# Patient Record
Sex: Female | Born: 1940 | Race: White | Hispanic: No | Marital: Married | Smoking: Former smoker
Health system: Southern US, Community
[De-identification: ages and names within clinical notes are randomized; demographics above are authoritative.]

## PROBLEM LIST (undated history)

## (undated) DIAGNOSIS — A0811 Acute gastroenteropathy due to Norwalk agent: Secondary | ICD-10-CM

## (undated) DIAGNOSIS — M1990 Unspecified osteoarthritis, unspecified site: Secondary | ICD-10-CM

## (undated) DIAGNOSIS — M8580 Other specified disorders of bone density and structure, unspecified site: Secondary | ICD-10-CM

## (undated) DIAGNOSIS — N3021 Other chronic cystitis with hematuria: Secondary | ICD-10-CM

## (undated) DIAGNOSIS — F32A Depression, unspecified: Secondary | ICD-10-CM

## (undated) DIAGNOSIS — R1013 Epigastric pain: Secondary | ICD-10-CM

## (undated) DIAGNOSIS — Z8614 Personal history of Methicillin resistant Staphylococcus aureus infection: Secondary | ICD-10-CM

## (undated) DIAGNOSIS — G8929 Other chronic pain: Secondary | ICD-10-CM

## (undated) DIAGNOSIS — E785 Hyperlipidemia, unspecified: Secondary | ICD-10-CM

## (undated) DIAGNOSIS — R4189 Other symptoms and signs involving cognitive functions and awareness: Secondary | ICD-10-CM

## (undated) DIAGNOSIS — F329 Major depressive disorder, single episode, unspecified: Secondary | ICD-10-CM

## (undated) DIAGNOSIS — N289 Disorder of kidney and ureter, unspecified: Secondary | ICD-10-CM

## (undated) DIAGNOSIS — C9030 Solitary plasmacytoma not having achieved remission: Secondary | ICD-10-CM

## (undated) DIAGNOSIS — L8994 Pressure ulcer of unspecified site, stage 4: Secondary | ICD-10-CM

## (undated) DIAGNOSIS — N3020 Other chronic cystitis without hematuria: Secondary | ICD-10-CM

## (undated) DIAGNOSIS — R1310 Dysphagia, unspecified: Secondary | ICD-10-CM

## (undated) DIAGNOSIS — R32 Unspecified urinary incontinence: Secondary | ICD-10-CM

## (undated) DIAGNOSIS — F0390 Unspecified dementia without behavioral disturbance: Secondary | ICD-10-CM

---

## 2002-10-01 ENCOUNTER — Emergency Department (HOSPITAL_COMMUNITY): Admission: EM | Admit: 2002-10-01 | Discharge: 2002-10-01 | Payer: Self-pay | Admitting: Emergency Medicine

## 2002-10-01 ENCOUNTER — Encounter: Payer: Self-pay | Admitting: Emergency Medicine

## 2007-12-21 ENCOUNTER — Ambulatory Visit: Payer: Self-pay | Admitting: Internal Medicine

## 2008-01-18 ENCOUNTER — Ambulatory Visit: Payer: Self-pay | Admitting: Cardiology

## 2008-01-18 ENCOUNTER — Ambulatory Visit: Payer: Self-pay

## 2008-02-11 ENCOUNTER — Encounter: Admission: RE | Admit: 2008-02-11 | Discharge: 2008-02-11 | Payer: Self-pay | Admitting: Neurology

## 2008-08-30 ENCOUNTER — Ambulatory Visit: Payer: Self-pay | Admitting: Gastroenterology

## 2009-09-17 ENCOUNTER — Inpatient Hospital Stay (HOSPITAL_COMMUNITY): Admission: EM | Admit: 2009-09-17 | Discharge: 2009-10-05 | Payer: Self-pay | Admitting: Emergency Medicine

## 2009-09-17 ENCOUNTER — Ambulatory Visit: Payer: Self-pay | Admitting: Cardiovascular Disease

## 2009-09-18 ENCOUNTER — Encounter: Payer: Self-pay | Admitting: Cardiology

## 2009-09-18 ENCOUNTER — Encounter (INDEPENDENT_AMBULATORY_CARE_PROVIDER_SITE_OTHER): Payer: Self-pay | Admitting: General Surgery

## 2009-10-05 ENCOUNTER — Inpatient Hospital Stay: Admission: RE | Admit: 2009-10-05 | Discharge: 2009-11-05 | Payer: Self-pay

## 2009-10-05 ENCOUNTER — Ambulatory Visit: Payer: Self-pay | Admitting: Critical Care Medicine

## 2009-10-22 ENCOUNTER — Ambulatory Visit: Payer: Self-pay | Admitting: Pulmonary Disease

## 2009-11-28 ENCOUNTER — Inpatient Hospital Stay: Payer: Self-pay | Admitting: Internal Medicine

## 2009-12-15 ENCOUNTER — Inpatient Hospital Stay: Payer: Self-pay | Admitting: Internal Medicine

## 2010-07-15 ENCOUNTER — Ambulatory Visit: Payer: Self-pay | Admitting: Internal Medicine

## 2010-08-09 IMAGING — CR DG ABD PORTABLE 1V
1 series · 1 of 1 positions shown · non-contrast
Comparison: Abdomen 09/27/2009.

CLINICAL DATA: Panda placement.

ABDOMEN - 1 VIEW

[AP]
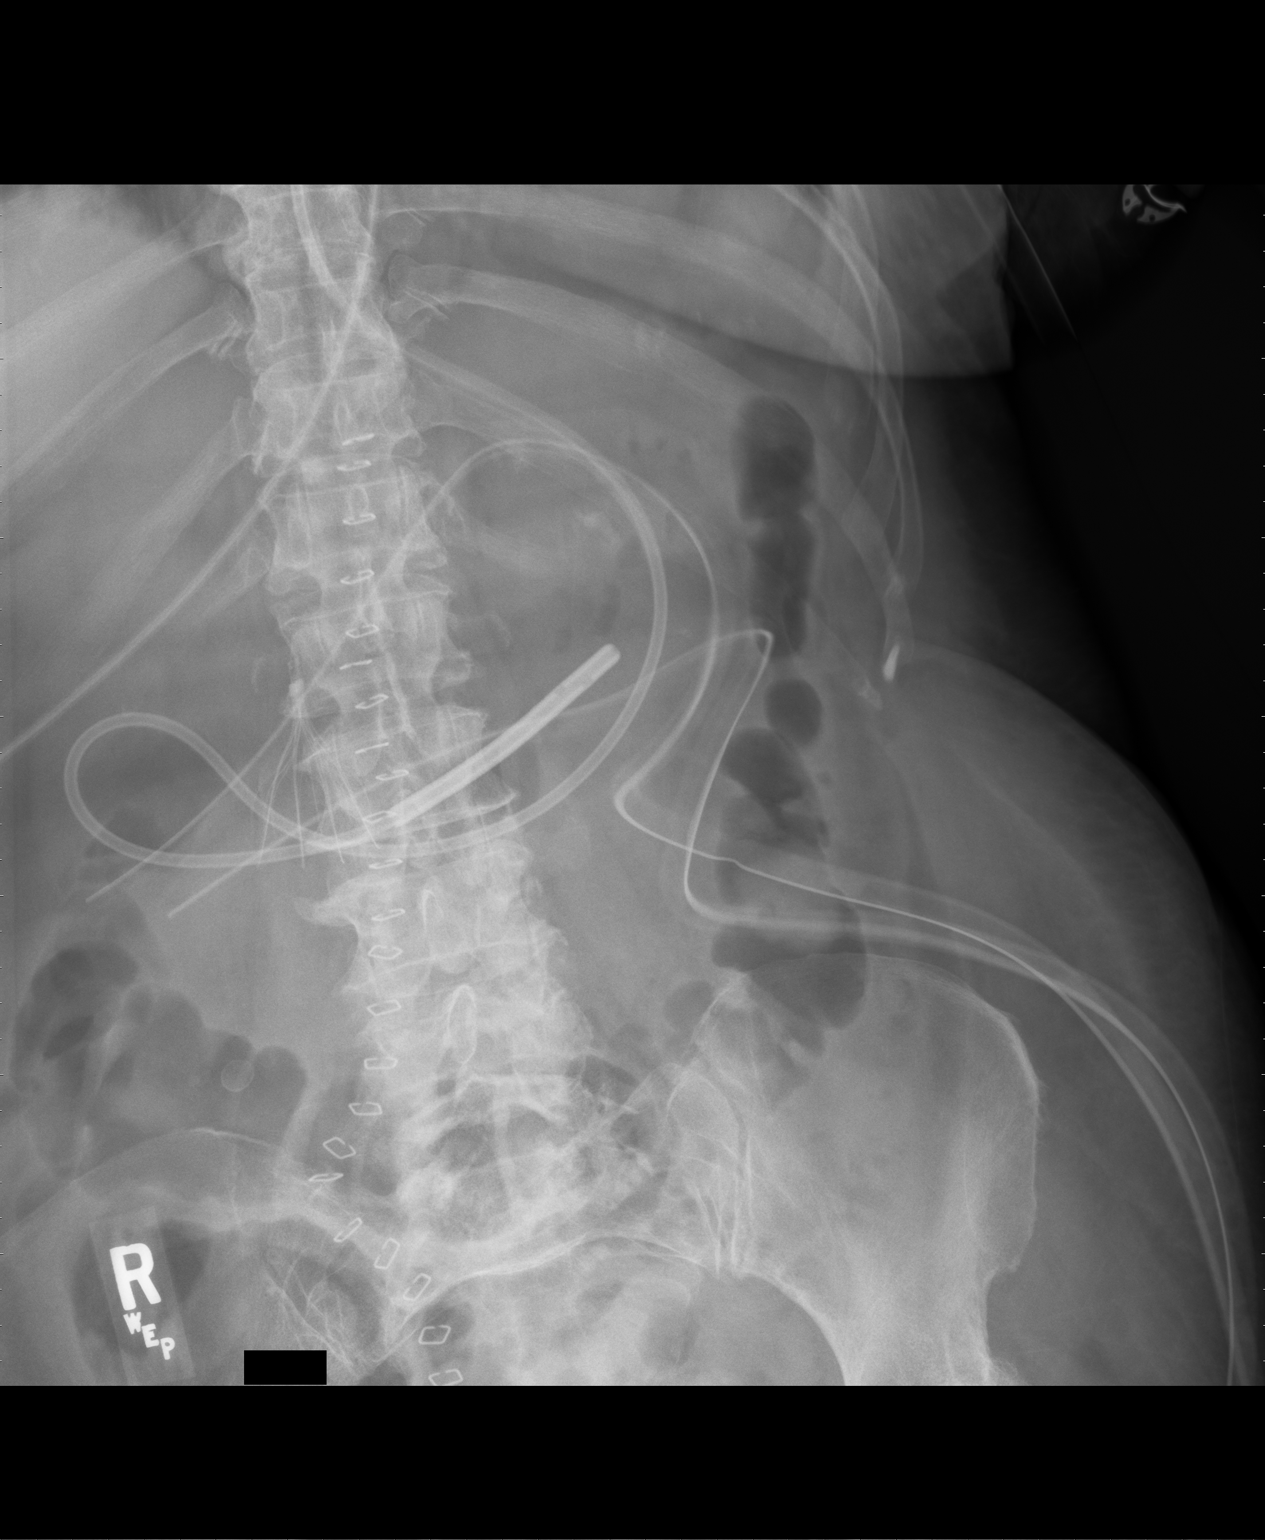

[1 of 1 positions shown; findings below may reference images not displayed]

FINDINGS: Panda tube remains looped in the distal stomach with the
tip projecting retrograde in the mid body of the stomach.
IMPRESSION: As above.

## 2010-08-13 IMAGING — CR DG CHEST 1V PORT
1 series · 1 of 1 positions shown · non-contrast
Comparison: Portable chest x-ray of 10/01/2009

CLINICAL DATA: Trauma, on ventilator, follow-up

PORTABLE CHEST - 1 VIEW

[AP]
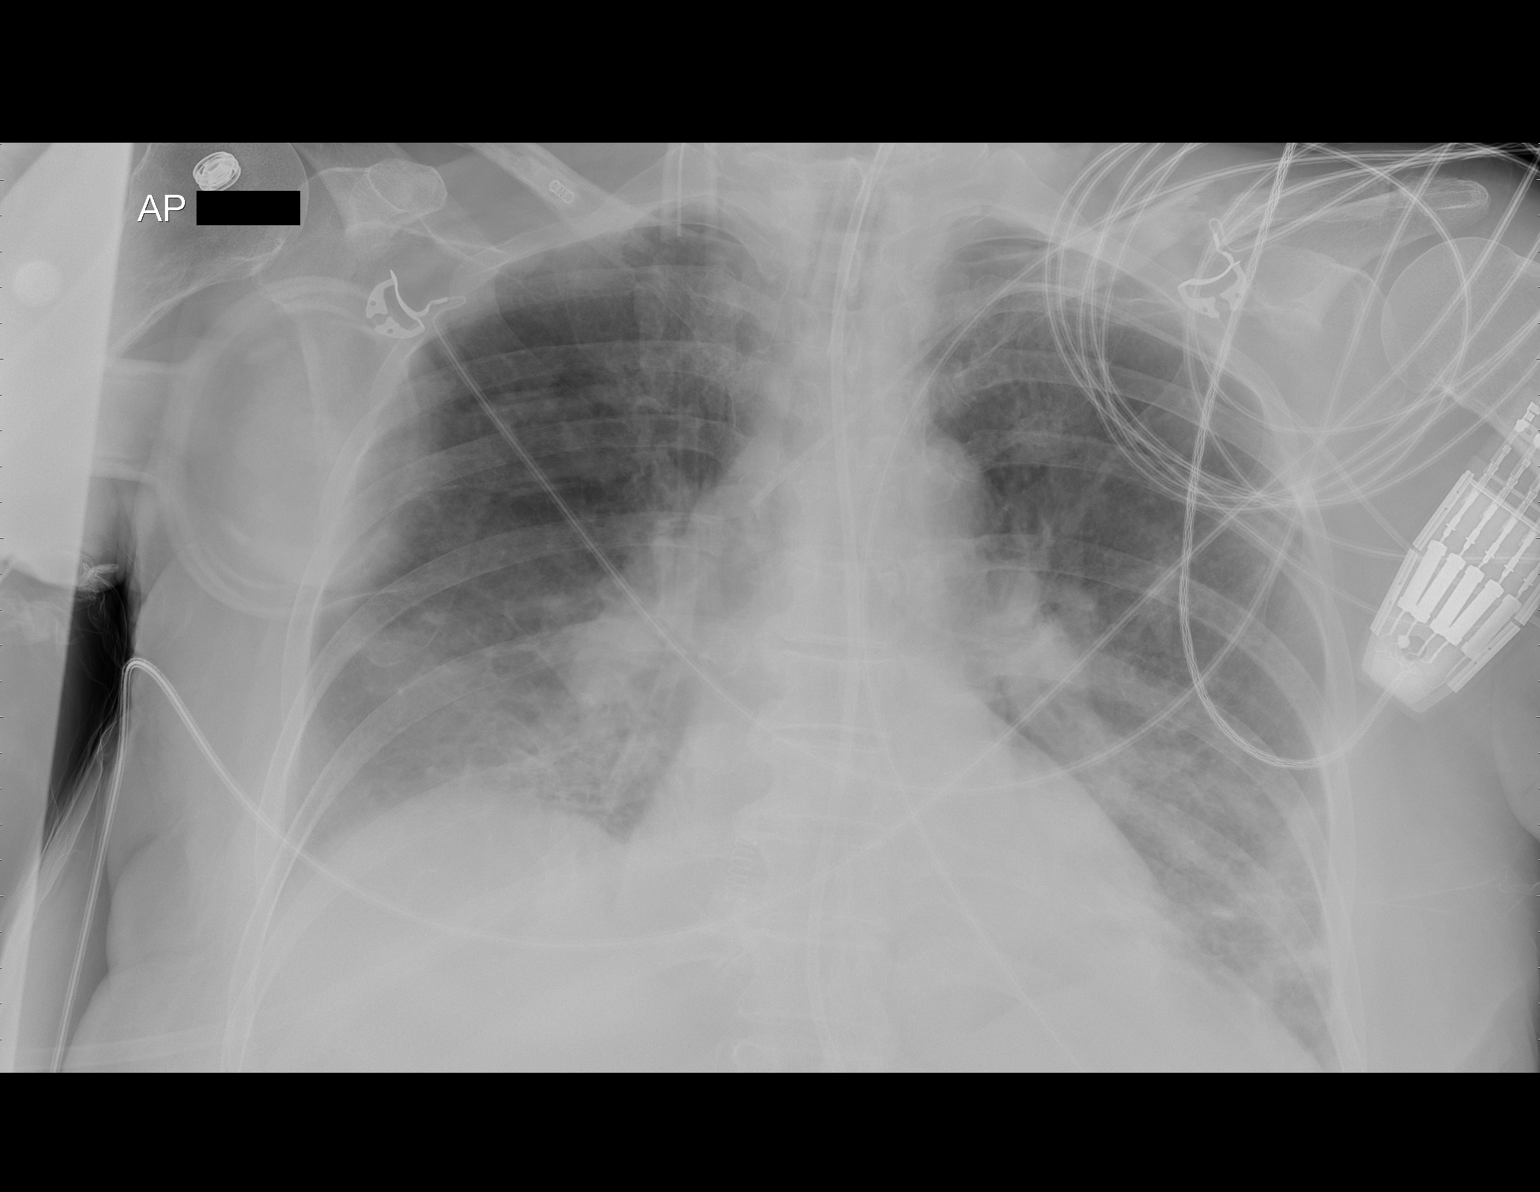

[1 of 1 positions shown; findings below may reference images not displayed]

FINDINGS: There is little change to perhaps slight improvement in
aeration of the lung bases.  The left chest tube has been removed
and no pneumothorax is seen.  Basilar opacities remain.  Heart size
is stable.  Endotracheal tube, left central venous line, and
feeding tube remain.
IMPRESSION: Left chest tube removed.  No pneumothorax.  Basilar opacities
remain.

## 2010-08-14 IMAGING — CR DG CHEST 1V PORT
1 series · 1 of 1 positions shown · non-contrast
Comparison: 10/02/2009

CLINICAL DATA: Pneumothorax, respiratory failure.

PORTABLE CHEST - 1 VIEW

[view not recorded]
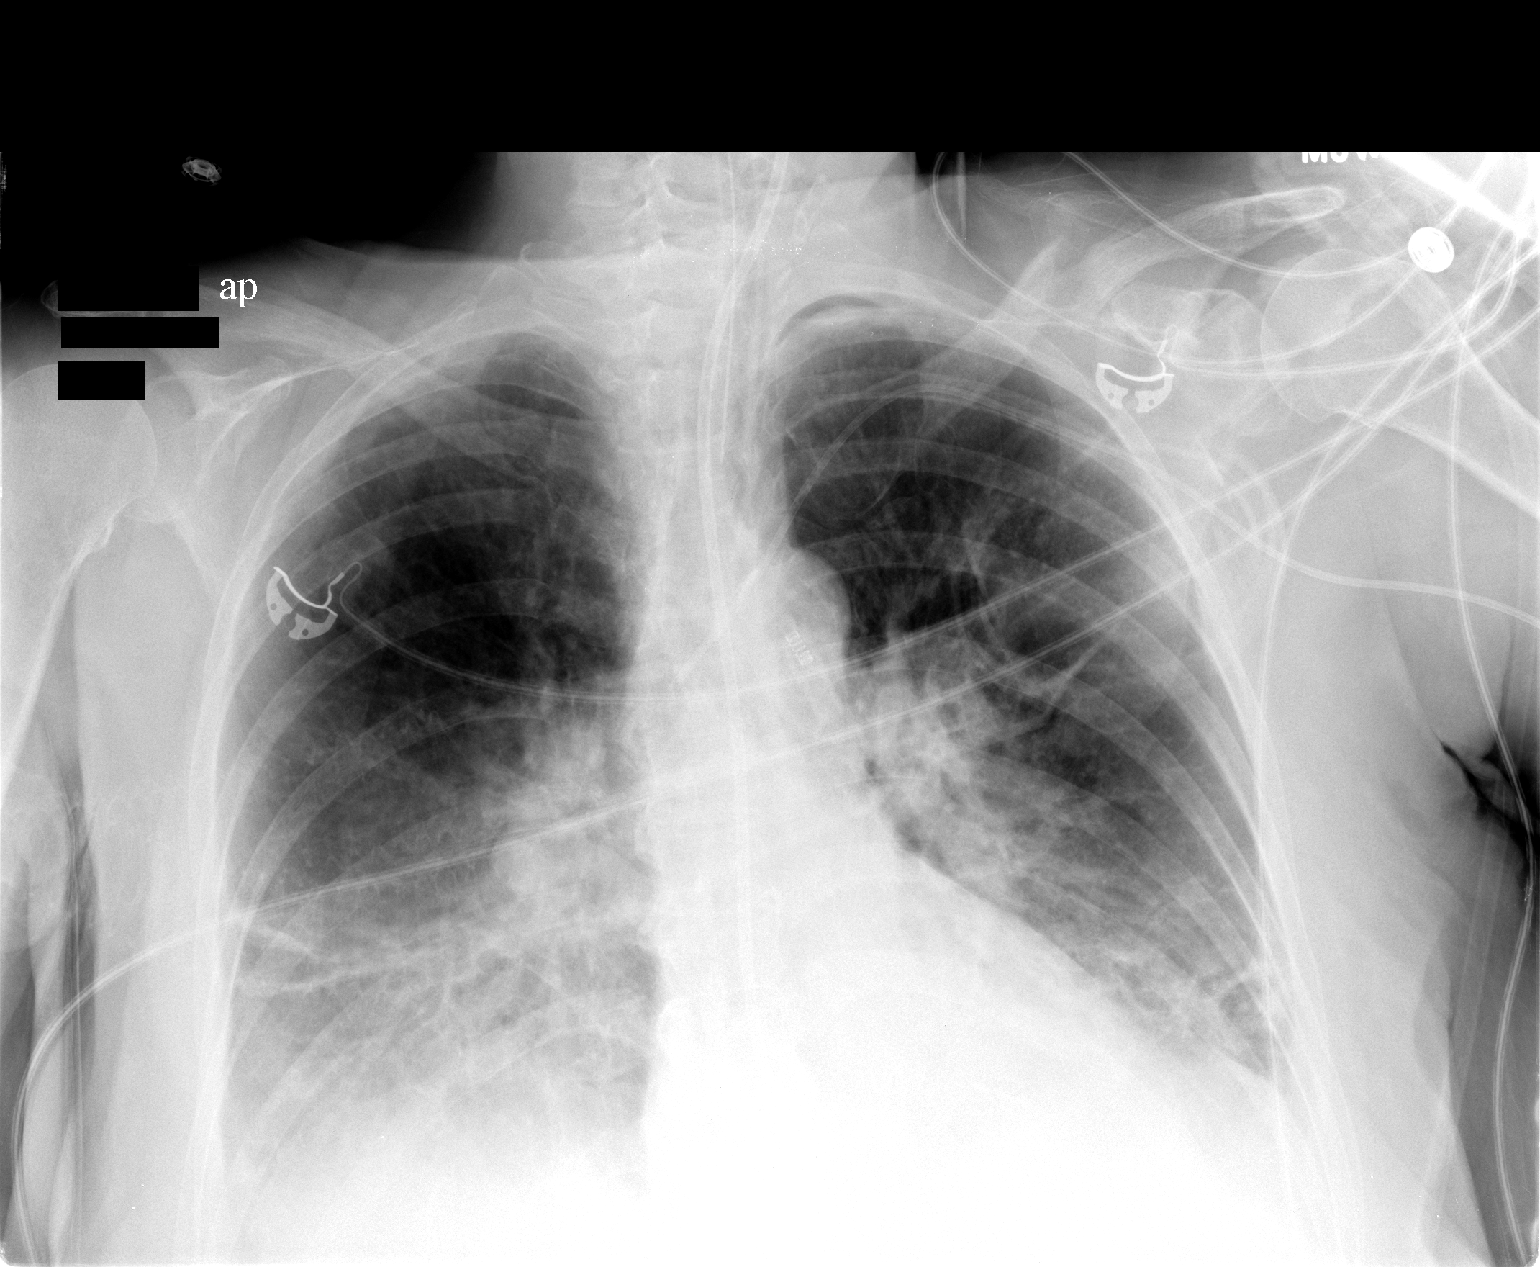

[1 of 1 positions shown; findings below may reference images not displayed]

FINDINGS: Support devices remain in stable position.  Increasing
bibasilar opacities, which could represent edema or atelectasis.
Suspect small layering bilateral pleural effusions.  There is
vascular congestion.  Heart is upper limits normal in size.
IMPRESSION: Increasing bibasilar opacities, possibly edema or atelectasis.
Question bilateral pleural effusions.

## 2010-08-16 IMAGING — CR DG CHEST 1V PORT
1 series · 1 of 1 positions shown · non-contrast
Comparison: Portable chest x-ray of 10/04/2009

CLINICAL DATA: Trauma, left pneumothorax, follow

PORTABLE CHEST - 1 VIEW

[AP]
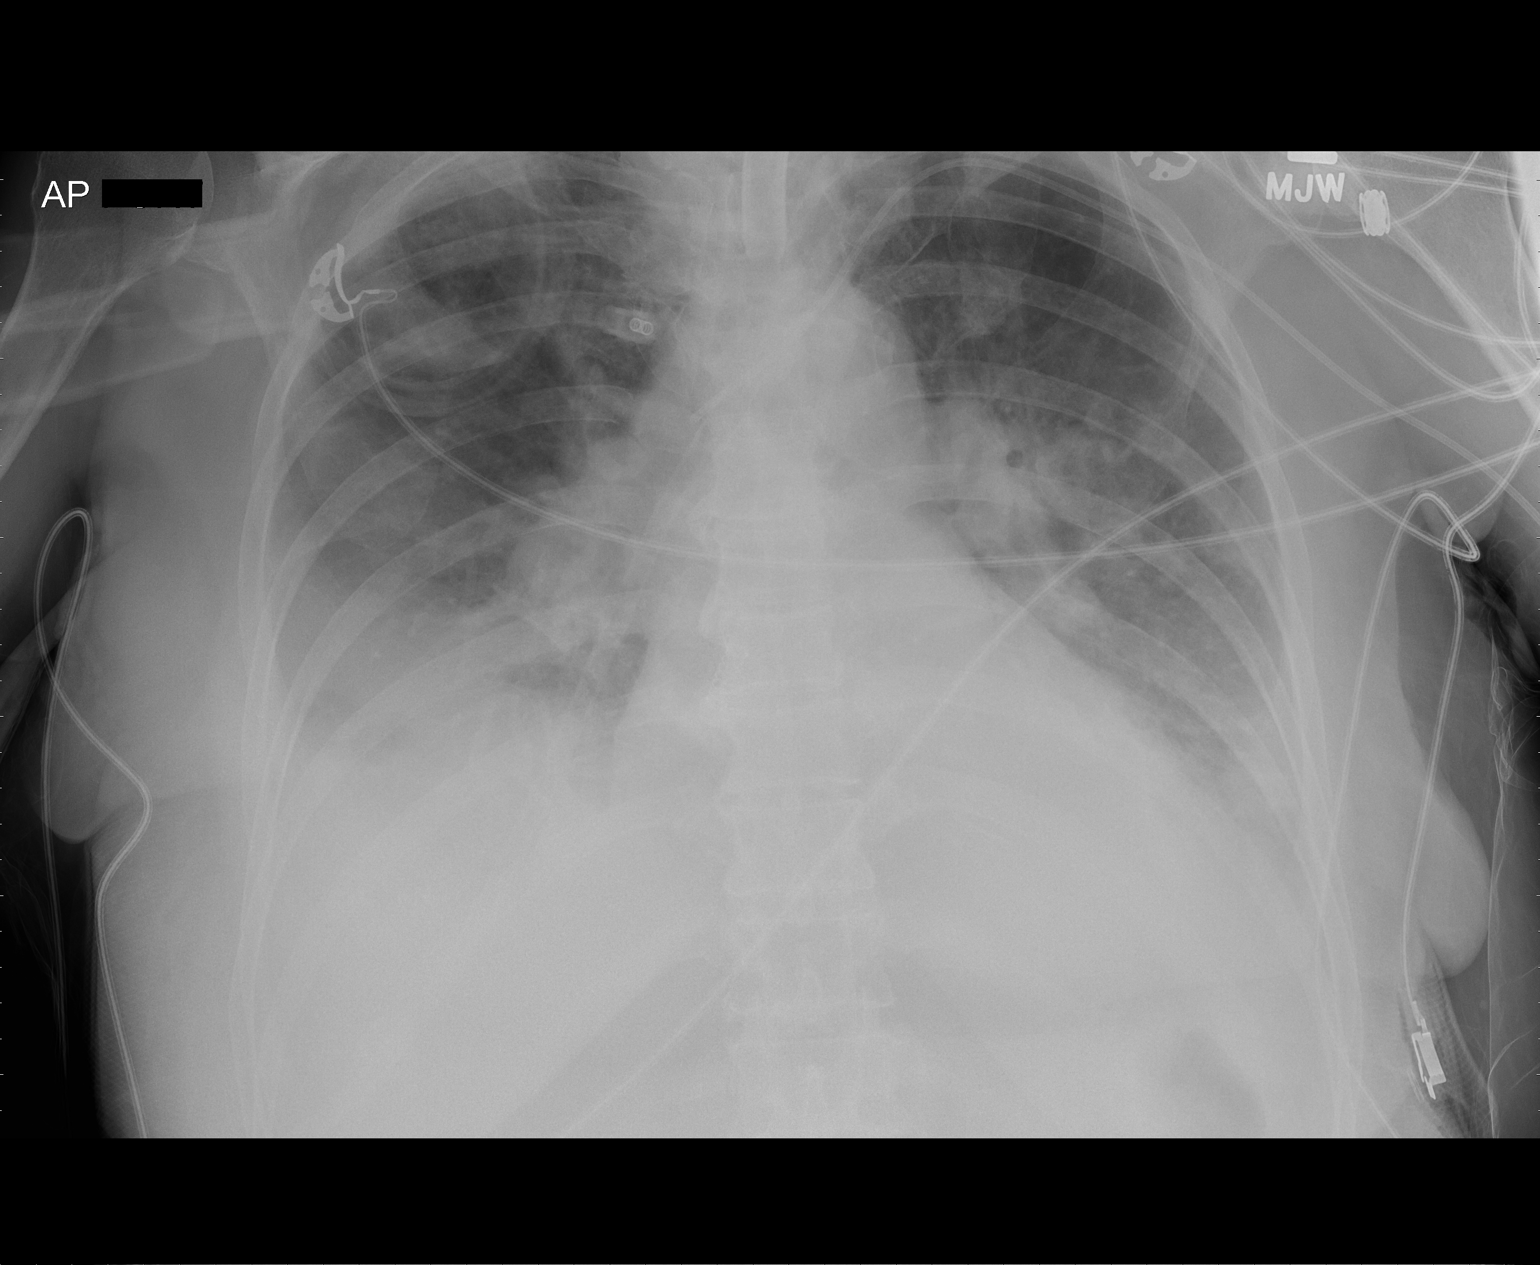

[1 of 1 positions shown; findings below may reference images not displayed]

FINDINGS: The lungs are not as well aerated with increase in
basilar opacities bilaterally.  This may be due to atelectasis,
effusion, or pneumonia.  No definite pneumothorax is seen.
Tracheostomy and left central venous line remain.  Displaced left
clavicular fracture is again noted.
IMPRESSION: Diminished aeration with increase in opacities at the lung bases.
No definite pneumothorax.

## 2010-08-21 IMAGING — US US ABDOMEN COMPLETE
1 series · 13 of 25 positions shown · non-contrast
Comparison: Abdominal CTs 09/17/2009 and 09/25/2009.

CLINICAL DATA: Transaminitis.  History of cholecystectomy and
recent trauma.

COMPLETE ABDOMINAL ULTRASOUND

[Series 1: us abdomen complete · 0.23mm/px · 13 of 47 slices shown]
[im 1/47]
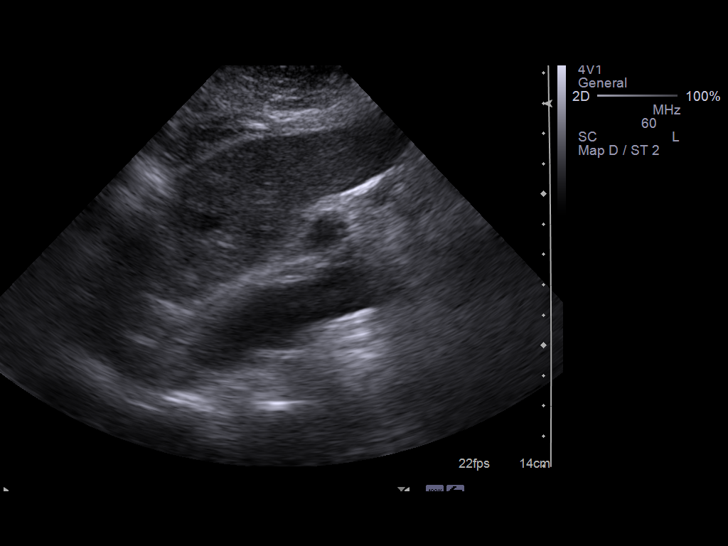
[im 4/47]
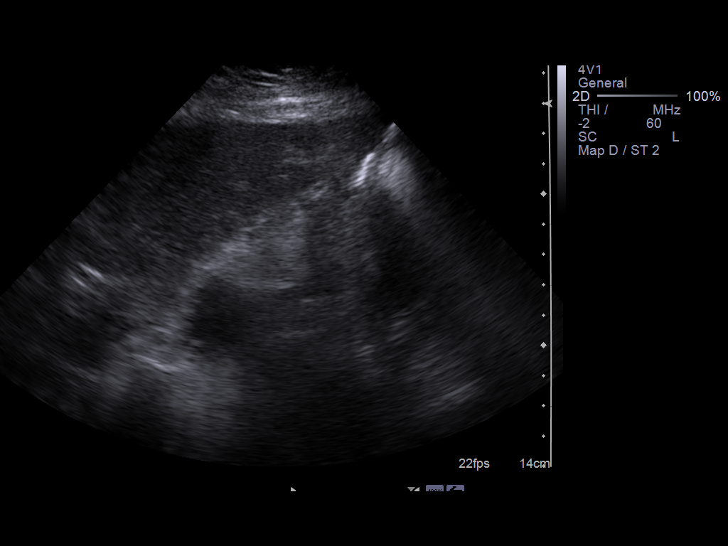
[im 8/47]
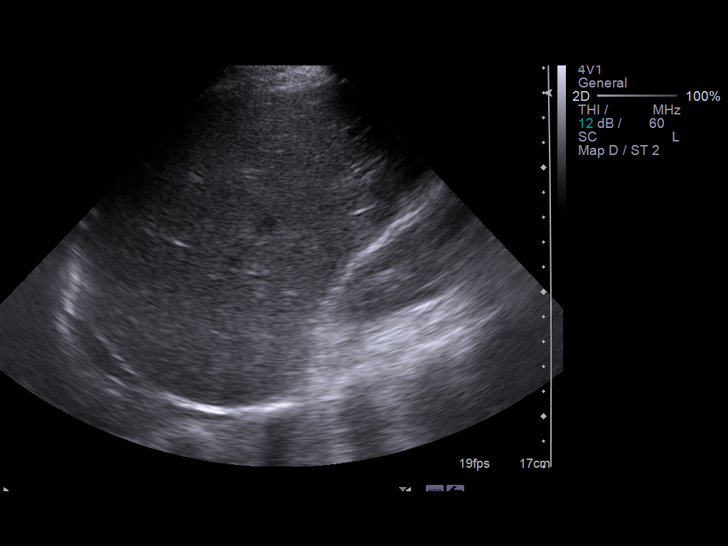
[im 12/47]
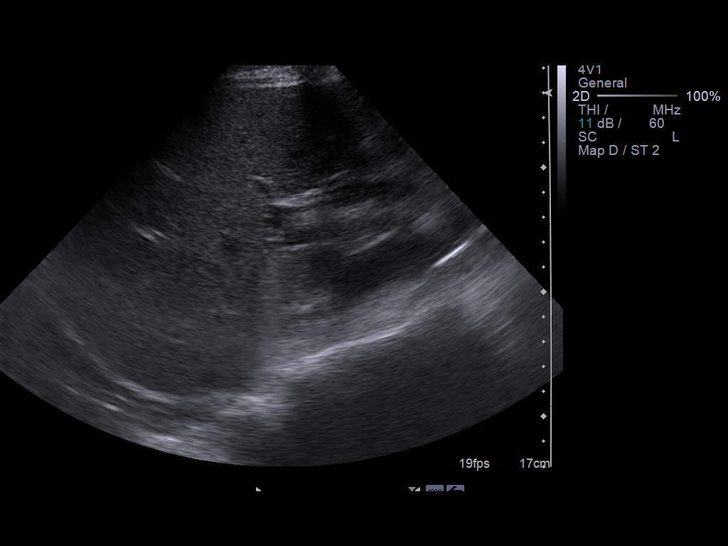
[im 16/47]
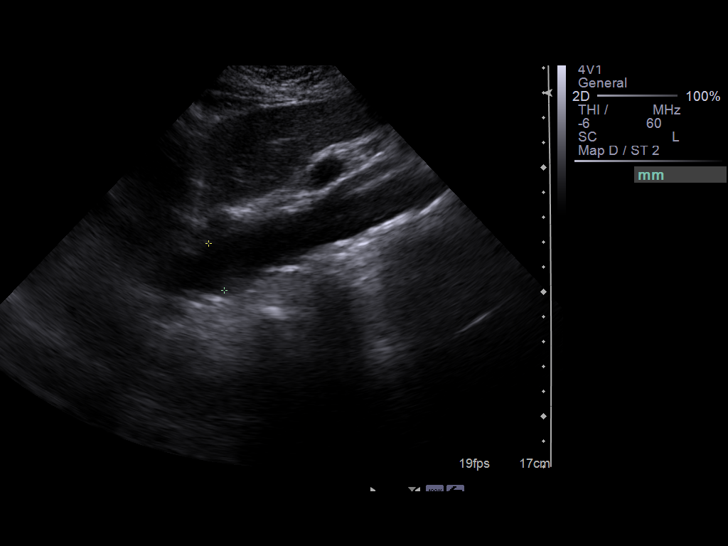
[im 20/47]
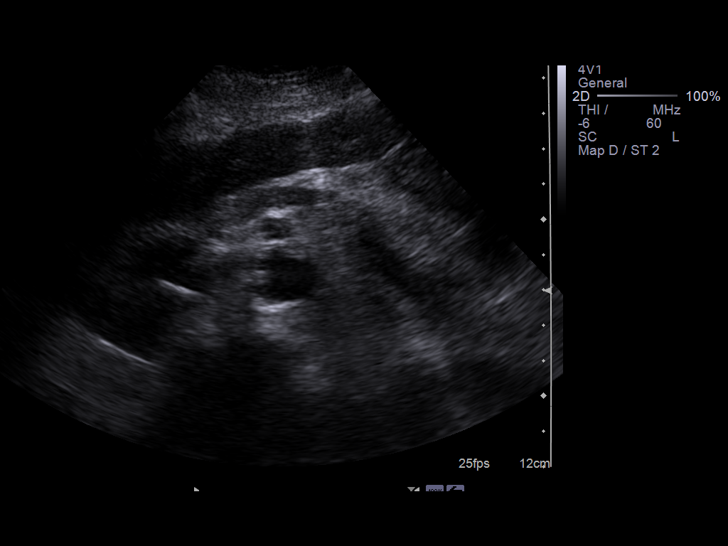
[im 24/47]
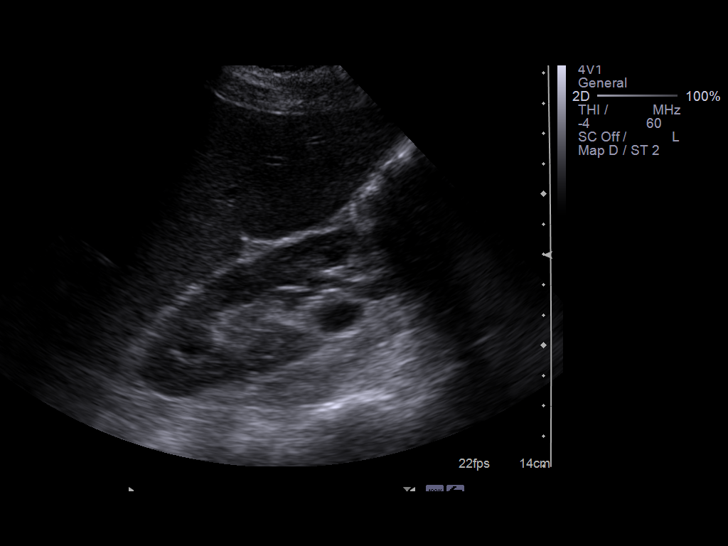
[im 27/47]
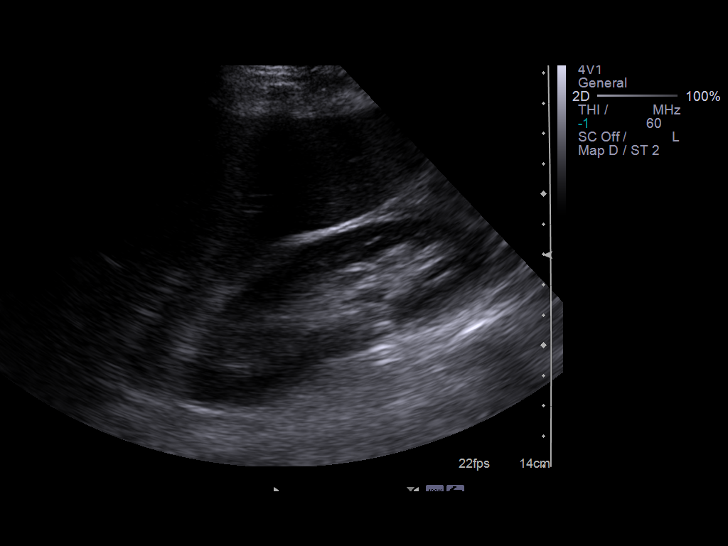
[im 31/47]
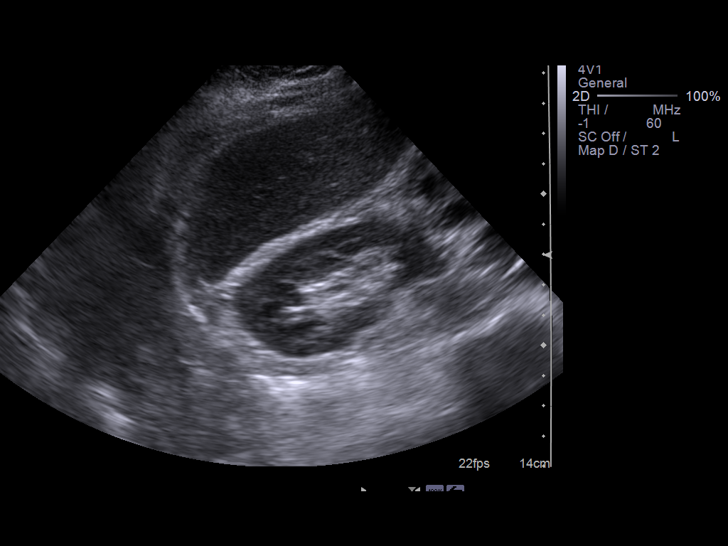
[im 35/47]
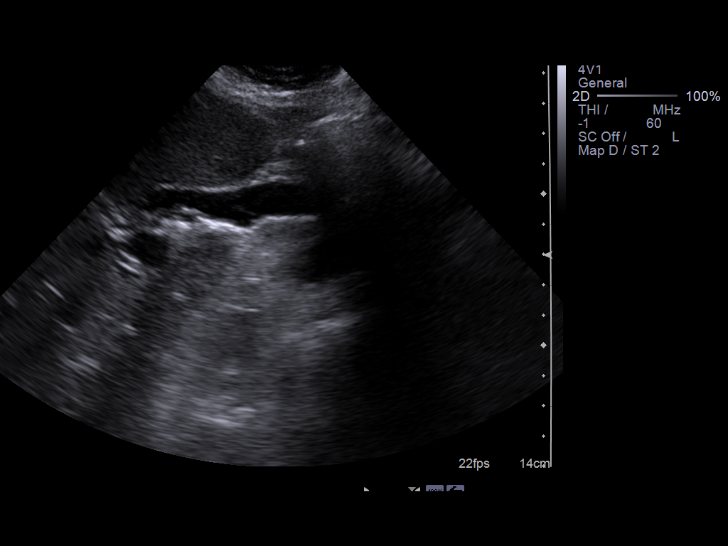
[im 39/47]
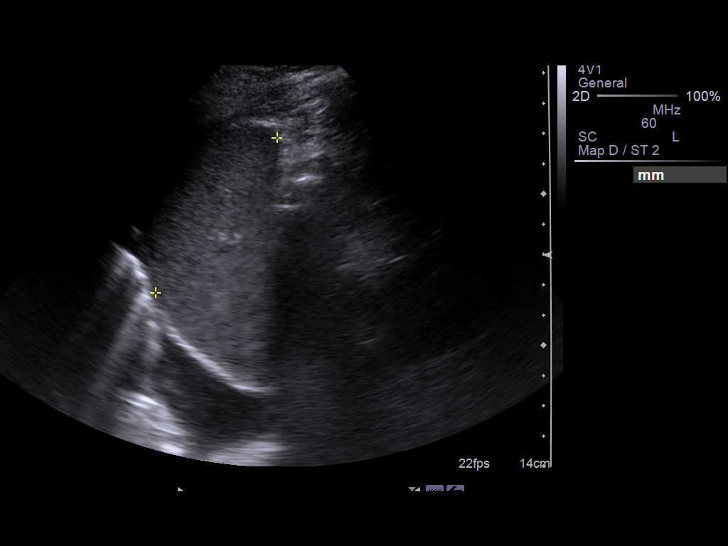
[im 43/47]
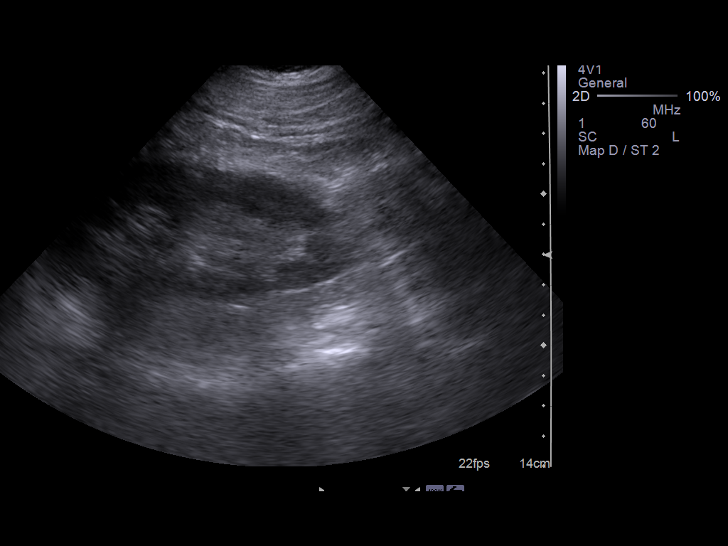
[im 47/47]
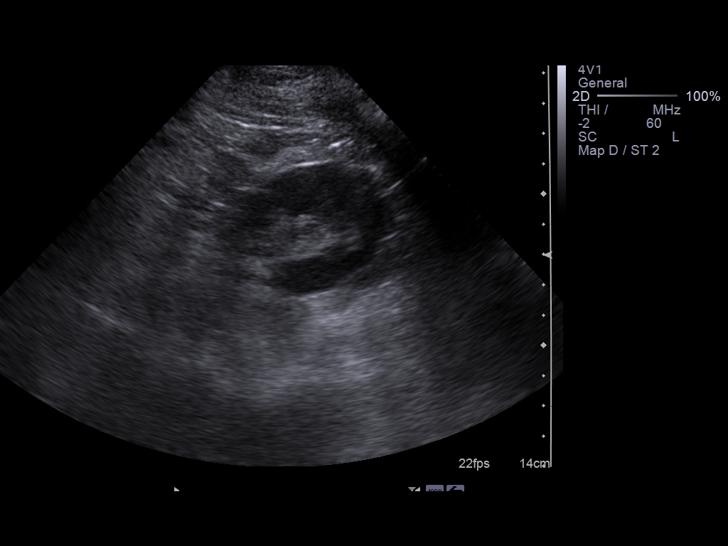

[13 of 25 positions shown; findings below may reference images not displayed]

FINDINGS: Gallbladder: Surgically absent.

Common bile duct:   Mildly dilated to 11.5 mm.  This is similar to
recent CTs.

Liver:  Echogenicity is within normal limits.  No focal hepatic
abnormalities are identified.

IVC: Visualized portions appear unremarkable.

Pancreas:  Visualized portions appear unremarkable.Portions of the
pancreatic tail are obscured by bowel gas.

Spleen:  Visualized portions appear unremarkable.

Right Kidney:  The renal cortical thickness and echogenicity are
preserved.  There is no hydronephrosis or focal abnormality. Renal
length is 11.0 cm.

Left Kidney:  The renal cortical thickness and echogenicity are
preserved.  There is no hydronephrosis or focal abnormality. Renal
length is 10.5 cm.

Abdominal aorta:  Visualized portions appear unremarkable.

Small bilateral pleural effusions are noted.
IMPRESSION: 1.  Stable biliary dilatation status post cholecystectomy.  This
may be physiologic.  Correlation with liver function tests is
recommended.
2.  No solid parenchymal abnormalities are identified by
ultrasound.
3.  Stable small bilateral pleural effusions.

## 2010-11-10 ENCOUNTER — Encounter: Payer: Self-pay | Admitting: Internal Medicine

## 2011-01-05 LAB — CBC
HCT: 31.7 % — ABNORMAL LOW (ref 36.0–46.0)
HCT: 35 % — ABNORMAL LOW (ref 36.0–46.0)
HCT: 38.5 % (ref 36.0–46.0)
Hemoglobin: 12.1 g/dL (ref 12.0–15.0)
MCHC: 33.8 g/dL (ref 30.0–36.0)
MCV: 88.9 fL (ref 78.0–100.0)
Platelets: 264 10*3/uL (ref 150–400)
Platelets: 270 10*3/uL (ref 150–400)
RBC: 3.95 MIL/uL (ref 3.87–5.11)
RDW: 15.6 % — ABNORMAL HIGH (ref 11.5–15.5)
RDW: 16.8 % — ABNORMAL HIGH (ref 11.5–15.5)
WBC: 4.9 10*3/uL (ref 4.0–10.5)

## 2011-01-05 LAB — COMPREHENSIVE METABOLIC PANEL
AST: 56 U/L — ABNORMAL HIGH (ref 0–37)
Albumin: 2.6 g/dL — ABNORMAL LOW (ref 3.5–5.2)
Albumin: 3.1 g/dL — ABNORMAL LOW (ref 3.5–5.2)
Alkaline Phosphatase: 175 U/L — ABNORMAL HIGH (ref 39–117)
BUN: 11 mg/dL (ref 6–23)
CO2: 26 mEq/L (ref 19–32)
Chloride: 98 mEq/L (ref 96–112)
Chloride: 98 mEq/L (ref 96–112)
Creatinine, Ser: 0.42 mg/dL (ref 0.4–1.2)
Creatinine, Ser: 0.45 mg/dL (ref 0.4–1.2)
GFR calc non Af Amer: >60 mL/min (ref >60)
Potassium: 4.3 mEq/L (ref 3.5–5.1)
Total Bilirubin: 0.6 mg/dL (ref 0.3–1.2)
Total Bilirubin: 0.6 mg/dL (ref 0.3–1.2)
Total Protein: 6.2 g/dL (ref 6.0–8.3)

## 2011-01-05 LAB — MAGNESIUM: Magnesium: 2.1 mg/dL (ref 1.5–2.5)

## 2011-01-05 LAB — BASIC METABOLIC PANEL
BUN: 7 mg/dL (ref 6–23)
Chloride: 94 mEq/L — ABNORMAL LOW (ref 96–112)
Creatinine, Ser: 0.47 mg/dL (ref 0.4–1.2)
Glucose, Bld: 102 mg/dL — ABNORMAL HIGH (ref 70–99)
Potassium: 4.8 mEq/L (ref 3.5–5.1)

## 2011-01-05 LAB — DIFFERENTIAL
Basophils Absolute: 0 10*3/uL (ref 0.0–0.1)
Basophils Relative: 1 % (ref 0–1)
Basophils Relative: 1 % (ref 0–1)
Eosinophils Absolute: 0.1 10*3/uL (ref 0.0–0.7)
Lymphocytes Relative: 29 % (ref 12–46)
Lymphs Abs: 1.9 10*3/uL (ref 0.7–4.0)
Monocytes Absolute: 0.6 10*3/uL (ref 0.1–1.0)
Monocytes Absolute: 0.7 10*3/uL (ref 0.1–1.0)
Monocytes Relative: 10 % (ref 3–12)
Monocytes Relative: 13 % — ABNORMAL HIGH (ref 3–12)
Neutro Abs: 2.6 10*3/uL (ref 1.7–7.7)
Neutro Abs: 4 10*3/uL (ref 1.7–7.7)
Neutrophils Relative %: 52 % (ref 43–77)
Neutrophils Relative %: 59 % (ref 43–77)

## 2011-01-05 LAB — CORTISOL

## 2011-01-05 LAB — PHOSPHORUS: Phosphorus: 5.2 mg/dL — ABNORMAL HIGH (ref 2.3–4.6)

## 2011-01-05 LAB — PREALBUMIN: Prealbumin: 16.2 mg/dL — ABNORMAL LOW (ref 18.0–45.0)

## 2011-01-05 LAB — AMMONIA: Ammonia: 17 umol/L (ref 11–35)

## 2011-01-20 LAB — COMPREHENSIVE METABOLIC PANEL
ALT: 61 U/L — ABNORMAL HIGH (ref 0–35)
ALT: 66 U/L — ABNORMAL HIGH (ref 0–35)
ALT: 72 U/L — ABNORMAL HIGH (ref 0–35)
AST: 45 U/L — ABNORMAL HIGH (ref 0–37)
AST: 47 U/L — ABNORMAL HIGH (ref 0–37)
Albumin: 1.4 g/dL — ABNORMAL LOW (ref 3.5–5.2)
Alkaline Phosphatase: 1189 U/L — ABNORMAL HIGH (ref 39–117)
Alkaline Phosphatase: 300 U/L — ABNORMAL HIGH (ref 39–117)
Alkaline Phosphatase: 382 U/L — ABNORMAL HIGH (ref 39–117)
BUN: 10 mg/dL (ref 6–23)
BUN: 14 mg/dL (ref 6–23)
CO2: 28 mEq/L (ref 19–32)
CO2: 29 mEq/L (ref 19–32)
CO2: 31 mEq/L (ref 19–32)
Calcium: 7.3 mg/dL — ABNORMAL LOW (ref 8.4–10.5)
Chloride: 100 mEq/L (ref 96–112)
Chloride: 101 mEq/L (ref 96–112)
Creatinine, Ser: 0.38 mg/dL — ABNORMAL LOW (ref 0.4–1.2)
Creatinine, Ser: 0.42 mg/dL (ref 0.4–1.2)
GFR calc Af Amer: >60 mL/min (ref >60)
GFR calc Af Amer: >60 mL/min (ref >60)
GFR calc non Af Amer: >60 mL/min (ref >60)
Glucose, Bld: 110 mg/dL — ABNORMAL HIGH (ref 70–99)
Glucose, Bld: 120 mg/dL — ABNORMAL HIGH (ref 70–99)
Glucose, Bld: 121 mg/dL — ABNORMAL HIGH (ref 70–99)
Potassium: 3.1 mEq/L — ABNORMAL LOW (ref 3.5–5.1)
Potassium: 3.8 mEq/L (ref 3.5–5.1)
Potassium: 4.2 mEq/L (ref 3.5–5.1)
Sodium: 132 mEq/L — ABNORMAL LOW (ref 135–145)
Sodium: 133 mEq/L — ABNORMAL LOW (ref 135–145)
Sodium: 135 mEq/L (ref 135–145)
Total Bilirubin: 0.6 mg/dL (ref 0.3–1.2)
Total Bilirubin: 0.8 mg/dL (ref 0.3–1.2)
Total Protein: 5 g/dL — ABNORMAL LOW (ref 6.0–8.3)
Total Protein: 5.6 g/dL — ABNORMAL LOW (ref 6.0–8.3)
Total Protein: 6.2 g/dL (ref 6.0–8.3)

## 2011-01-20 LAB — DIFFERENTIAL
Basophils Absolute: 0 10*3/uL (ref 0.0–0.1)
Basophils Absolute: 0.1 10*3/uL (ref 0.0–0.1)
Basophils Relative: 0 % (ref 0–1)
Basophils Relative: 0 % (ref 0–1)
Basophils Relative: 1 % (ref 0–1)
Eosinophils Absolute: 0.2 10*3/uL (ref 0.0–0.7)
Eosinophils Relative: 1 % (ref 0–5)
Lymphocytes Relative: 11 % — ABNORMAL LOW (ref 12–46)
Monocytes Absolute: 0.8 10*3/uL (ref 0.1–1.0)
Monocytes Relative: 13 % — ABNORMAL HIGH (ref 3–12)
Monocytes Relative: 9 % (ref 3–12)
Neutro Abs: 6.8 10*3/uL (ref 1.7–7.7)
Neutro Abs: 7.2 10*3/uL (ref 1.7–7.7)
Neutrophils Relative %: 66 % (ref 43–77)
Neutrophils Relative %: 77 % (ref 43–77)

## 2011-01-20 LAB — BASIC METABOLIC PANEL
BUN: 8 mg/dL (ref 6–23)
Calcium: 7.4 mg/dL — ABNORMAL LOW (ref 8.4–10.5)
Creatinine, Ser: 0.47 mg/dL (ref 0.4–1.2)
GFR calc non Af Amer: >60 mL/min (ref >60)

## 2011-01-20 LAB — TSH: TSH: 1.244 u[IU]/mL (ref 0.350–4.500)

## 2011-01-20 LAB — CBC
HCT: 27 % — ABNORMAL LOW (ref 36.0–46.0)
HCT: 32.5 % — ABNORMAL LOW (ref 36.0–46.0)
Hemoglobin: 10.7 g/dL — ABNORMAL LOW (ref 12.0–15.0)
Hemoglobin: 10.9 g/dL — ABNORMAL LOW (ref 12.0–15.0)
Hemoglobin: 9.3 g/dL — ABNORMAL LOW (ref 12.0–15.0)
MCHC: 32.9 g/dL (ref 30.0–36.0)
MCHC: 34.2 g/dL (ref 30.0–36.0)
MCV: 92.3 fL (ref 78.0–100.0)
Platelets: 489 10*3/uL — ABNORMAL HIGH (ref 150–400)
Platelets: 512 10*3/uL — ABNORMAL HIGH (ref 150–400)
RBC: 2.94 MIL/uL — ABNORMAL LOW (ref 3.87–5.11)
RBC: 3.57 MIL/uL — ABNORMAL LOW (ref 3.87–5.11)
RDW: 15.4 % (ref 11.5–15.5)
RDW: 15.7 % — ABNORMAL HIGH (ref 11.5–15.5)
RDW: 15.9 % — ABNORMAL HIGH (ref 11.5–15.5)
WBC: 6.5 10*3/uL (ref 4.0–10.5)
WBC: 6.9 10*3/uL (ref 4.0–10.5)
WBC: 8.9 10*3/uL (ref 4.0–10.5)

## 2011-01-20 LAB — PROTIME-INR
INR: 1.09 (ref 0.00–1.49)
Prothrombin Time: 14 seconds (ref 11.6–15.2)

## 2011-01-20 LAB — GLUCOSE, CAPILLARY
Glucose-Capillary: 100 mg/dL — ABNORMAL HIGH (ref 70–99)
Glucose-Capillary: 80 mg/dL (ref 70–99)
Glucose-Capillary: 88 mg/dL (ref 70–99)
Glucose-Capillary: 89 mg/dL (ref 70–99)

## 2011-01-20 LAB — AMYLASE: Amylase: 36 U/L (ref 0–105)

## 2011-01-20 LAB — BLOOD GAS, ARTERIAL
Bicarbonate: 29.2 mEq/L — ABNORMAL HIGH (ref 20.0–24.0)
TCO2: 30.4 mmol/L (ref 0–100)
pCO2 arterial: 38.8 mmHg (ref 35.0–45.0)
pH, Arterial: 7.49 — ABNORMAL HIGH (ref 7.350–7.400)
pO2, Arterial: 89.4 mmHg (ref 80.0–100.0)

## 2011-01-20 LAB — PREALBUMIN
Prealbumin: 11.8 mg/dL — ABNORMAL LOW (ref 18.0–45.0)
Prealbumin: 8 mg/dL — ABNORMAL LOW (ref 18.0–45.0)

## 2011-01-20 LAB — LIPASE, BLOOD: Lipase: 19 U/L (ref 11–59)

## 2011-01-20 LAB — IRON AND TIBC
Iron: 12 ug/dL — ABNORMAL LOW (ref 42–135)
Saturation Ratios: 9 % — ABNORMAL LOW (ref 20–55)
UIBC: 118 ug/dL

## 2011-01-20 LAB — CLOSTRIDIUM DIFFICILE EIA
C difficile Toxins A+B, EIA: NEGATIVE
C difficile Toxins A+B, EIA: NEGATIVE

## 2011-01-20 LAB — PHOSPHORUS: Phosphorus: 4.6 mg/dL (ref 2.3–4.6)

## 2011-01-20 LAB — AMMONIA: Ammonia: 27 umol/L (ref 11–35)

## 2011-01-20 LAB — FOLATE RBC: RBC Folate: 870 ng/mL — ABNORMAL HIGH (ref 180–600)

## 2011-01-20 LAB — RETICULOCYTES: Retic Ct Pct: 1.5 % (ref 0.4–3.1)

## 2011-01-20 LAB — FERRITIN: Ferritin: 1464 ng/mL — ABNORMAL HIGH (ref 10–291)

## 2011-01-20 LAB — HEPATITIS PANEL, ACUTE: Hepatitis B Surface Ag: NEGATIVE

## 2011-01-21 LAB — GLUCOSE, CAPILLARY
Glucose-Capillary: 101 mg/dL — ABNORMAL HIGH (ref 70–99)
Glucose-Capillary: 103 mg/dL — ABNORMAL HIGH (ref 70–99)
Glucose-Capillary: 103 mg/dL — ABNORMAL HIGH (ref 70–99)
Glucose-Capillary: 107 mg/dL — ABNORMAL HIGH (ref 70–99)
Glucose-Capillary: 108 mg/dL — ABNORMAL HIGH (ref 70–99)
Glucose-Capillary: 108 mg/dL — ABNORMAL HIGH (ref 70–99)
Glucose-Capillary: 108 mg/dL — ABNORMAL HIGH (ref 70–99)
Glucose-Capillary: 109 mg/dL — ABNORMAL HIGH (ref 70–99)
Glucose-Capillary: 110 mg/dL — ABNORMAL HIGH (ref 70–99)
Glucose-Capillary: 112 mg/dL — ABNORMAL HIGH (ref 70–99)
Glucose-Capillary: 112 mg/dL — ABNORMAL HIGH (ref 70–99)
Glucose-Capillary: 113 mg/dL — ABNORMAL HIGH (ref 70–99)
Glucose-Capillary: 114 mg/dL — ABNORMAL HIGH (ref 70–99)
Glucose-Capillary: 115 mg/dL — ABNORMAL HIGH (ref 70–99)
Glucose-Capillary: 116 mg/dL — ABNORMAL HIGH (ref 70–99)
Glucose-Capillary: 118 mg/dL — ABNORMAL HIGH (ref 70–99)
Glucose-Capillary: 120 mg/dL — ABNORMAL HIGH (ref 70–99)
Glucose-Capillary: 120 mg/dL — ABNORMAL HIGH (ref 70–99)
Glucose-Capillary: 120 mg/dL — ABNORMAL HIGH (ref 70–99)
Glucose-Capillary: 120 mg/dL — ABNORMAL HIGH (ref 70–99)
Glucose-Capillary: 121 mg/dL — ABNORMAL HIGH (ref 70–99)
Glucose-Capillary: 121 mg/dL — ABNORMAL HIGH (ref 70–99)
Glucose-Capillary: 121 mg/dL — ABNORMAL HIGH (ref 70–99)
Glucose-Capillary: 122 mg/dL — ABNORMAL HIGH (ref 70–99)
Glucose-Capillary: 122 mg/dL — ABNORMAL HIGH (ref 70–99)
Glucose-Capillary: 122 mg/dL — ABNORMAL HIGH (ref 70–99)
Glucose-Capillary: 122 mg/dL — ABNORMAL HIGH (ref 70–99)
Glucose-Capillary: 122 mg/dL — ABNORMAL HIGH (ref 70–99)
Glucose-Capillary: 122 mg/dL — ABNORMAL HIGH (ref 70–99)
Glucose-Capillary: 122 mg/dL — ABNORMAL HIGH (ref 70–99)
Glucose-Capillary: 123 mg/dL — ABNORMAL HIGH (ref 70–99)
Glucose-Capillary: 123 mg/dL — ABNORMAL HIGH (ref 70–99)
Glucose-Capillary: 124 mg/dL — ABNORMAL HIGH (ref 70–99)
Glucose-Capillary: 125 mg/dL — ABNORMAL HIGH (ref 70–99)
Glucose-Capillary: 126 mg/dL — ABNORMAL HIGH (ref 70–99)
Glucose-Capillary: 127 mg/dL — ABNORMAL HIGH (ref 70–99)
Glucose-Capillary: 129 mg/dL — ABNORMAL HIGH (ref 70–99)
Glucose-Capillary: 132 mg/dL — ABNORMAL HIGH (ref 70–99)
Glucose-Capillary: 134 mg/dL — ABNORMAL HIGH (ref 70–99)
Glucose-Capillary: 135 mg/dL — ABNORMAL HIGH (ref 70–99)
Glucose-Capillary: 135 mg/dL — ABNORMAL HIGH (ref 70–99)
Glucose-Capillary: 136 mg/dL — ABNORMAL HIGH (ref 70–99)
Glucose-Capillary: 138 mg/dL — ABNORMAL HIGH (ref 70–99)
Glucose-Capillary: 139 mg/dL — ABNORMAL HIGH (ref 70–99)
Glucose-Capillary: 151 mg/dL — ABNORMAL HIGH (ref 70–99)
Glucose-Capillary: 77 mg/dL (ref 70–99)
Glucose-Capillary: 85 mg/dL (ref 70–99)
Glucose-Capillary: 86 mg/dL (ref 70–99)
Glucose-Capillary: 90 mg/dL (ref 70–99)
Glucose-Capillary: 91 mg/dL (ref 70–99)
Glucose-Capillary: 92 mg/dL (ref 70–99)
Glucose-Capillary: 92 mg/dL (ref 70–99)
Glucose-Capillary: 99 mg/dL (ref 70–99)

## 2011-01-21 LAB — CBC
HCT: 19.9 % — ABNORMAL LOW (ref 36.0–46.0)
HCT: 24 % — ABNORMAL LOW (ref 36.0–46.0)
HCT: 25.3 % — ABNORMAL LOW (ref 36.0–46.0)
HCT: 25.5 % — ABNORMAL LOW (ref 36.0–46.0)
HCT: 26.7 % — ABNORMAL LOW (ref 36.0–46.0)
HCT: 27.2 % — ABNORMAL LOW (ref 36.0–46.0)
HCT: 27.4 % — ABNORMAL LOW (ref 36.0–46.0)
HCT: 28.5 % — ABNORMAL LOW (ref 36.0–46.0)
HCT: 29.3 % — ABNORMAL LOW (ref 36.0–46.0)
HCT: 29.3 % — ABNORMAL LOW (ref 36.0–46.0)
HCT: 30.2 % — ABNORMAL LOW (ref 36.0–46.0)
Hemoglobin: 10.2 g/dL — ABNORMAL LOW (ref 12.0–15.0)
Hemoglobin: 7 g/dL — ABNORMAL LOW (ref 12.0–15.0)
Hemoglobin: 7.6 g/dL — ABNORMAL LOW (ref 12.0–15.0)
Hemoglobin: 8.3 g/dL — ABNORMAL LOW (ref 12.0–15.0)
Hemoglobin: 8.9 g/dL — ABNORMAL LOW (ref 12.0–15.0)
Hemoglobin: 9.3 g/dL — ABNORMAL LOW (ref 12.0–15.0)
Hemoglobin: 9.5 g/dL — ABNORMAL LOW (ref 12.0–15.0)
Hemoglobin: 9.9 g/dL — ABNORMAL LOW (ref 12.0–15.0)
MCHC: 33.9 g/dL (ref 30.0–36.0)
MCHC: 33.9 g/dL (ref 30.0–36.0)
MCHC: 34.3 g/dL (ref 30.0–36.0)
MCHC: 34.5 g/dL (ref 30.0–36.0)
MCHC: 34.5 g/dL (ref 30.0–36.0)
MCHC: 35.3 g/dL (ref 30.0–36.0)
MCV: 90.4 fL (ref 78.0–100.0)
MCV: 91.5 fL (ref 78.0–100.0)
MCV: 91.6 fL (ref 78.0–100.0)
MCV: 91.6 fL (ref 78.0–100.0)
MCV: 91.8 fL (ref 78.0–100.0)
MCV: 92.3 fL (ref 78.0–100.0)
MCV: 92.8 fL (ref 78.0–100.0)
MCV: 93.1 fL (ref 78.0–100.0)
Platelets: 152 10*3/uL (ref 150–400)
Platelets: 168 10*3/uL (ref 150–400)
Platelets: 178 10*3/uL (ref 150–400)
Platelets: 179 10*3/uL (ref 150–400)
Platelets: 204 10*3/uL (ref 150–400)
Platelets: 211 10*3/uL (ref 150–400)
Platelets: 218 10*3/uL (ref 150–400)
Platelets: 304 10*3/uL (ref 150–400)
Platelets: 445 10*3/uL — ABNORMAL HIGH (ref 150–400)
Platelets: 80 10*3/uL — ABNORMAL LOW (ref 150–400)
Platelets: 92 10*3/uL — ABNORMAL LOW (ref 150–400)
RBC: 2.59 MIL/uL — ABNORMAL LOW (ref 3.87–5.11)
RBC: 2.78 MIL/uL — ABNORMAL LOW (ref 3.87–5.11)
RBC: 2.87 MIL/uL — ABNORMAL LOW (ref 3.87–5.11)
RBC: 2.89 MIL/uL — ABNORMAL LOW (ref 3.87–5.11)
RBC: 2.9 MIL/uL — ABNORMAL LOW (ref 3.87–5.11)
RBC: 2.91 MIL/uL — ABNORMAL LOW (ref 3.87–5.11)
RBC: 2.99 MIL/uL — ABNORMAL LOW (ref 3.87–5.11)
RBC: 3.07 MIL/uL — ABNORMAL LOW (ref 3.87–5.11)
RBC: 3.15 MIL/uL — ABNORMAL LOW (ref 3.87–5.11)
RBC: 3.2 MIL/uL — ABNORMAL LOW (ref 3.87–5.11)
RBC: 3.24 MIL/uL — ABNORMAL LOW (ref 3.87–5.11)
RDW: 14.1 % (ref 11.5–15.5)
RDW: 14.2 % (ref 11.5–15.5)
RDW: 14.3 % (ref 11.5–15.5)
RDW: 14.4 % (ref 11.5–15.5)
RDW: 14.6 % (ref 11.5–15.5)
RDW: 14.8 % (ref 11.5–15.5)
RDW: 14.8 % (ref 11.5–15.5)
RDW: 14.9 % (ref 11.5–15.5)
RDW: 15.1 % (ref 11.5–15.5)
RDW: 15.6 % — ABNORMAL HIGH (ref 11.5–15.5)
WBC: 10 10*3/uL (ref 4.0–10.5)
WBC: 11.7 10*3/uL — ABNORMAL HIGH (ref 4.0–10.5)
WBC: 12.3 10*3/uL — ABNORMAL HIGH (ref 4.0–10.5)
WBC: 14.4 10*3/uL — ABNORMAL HIGH (ref 4.0–10.5)
WBC: 17.1 10*3/uL — ABNORMAL HIGH (ref 4.0–10.5)
WBC: 17.9 10*3/uL — ABNORMAL HIGH (ref 4.0–10.5)
WBC: 5.1 10*3/uL (ref 4.0–10.5)
WBC: 6.4 10*3/uL (ref 4.0–10.5)
WBC: 6.5 10*3/uL (ref 4.0–10.5)
WBC: 6.8 10*3/uL (ref 4.0–10.5)
WBC: 8.3 10*3/uL (ref 4.0–10.5)
WBC: 8.6 10*3/uL (ref 4.0–10.5)
WBC: 9.8 10*3/uL (ref 4.0–10.5)

## 2011-01-21 LAB — COMPREHENSIVE METABOLIC PANEL
ALT: 109 U/L — ABNORMAL HIGH (ref 0–35)
ALT: 197 U/L — ABNORMAL HIGH (ref 0–35)
ALT: 220 U/L — ABNORMAL HIGH (ref 0–35)
ALT: 280 U/L — ABNORMAL HIGH (ref 0–35)
ALT: 671 U/L — ABNORMAL HIGH (ref 0–35)
ALT: 76 U/L — ABNORMAL HIGH (ref 0–35)
AST: 195 U/L — ABNORMAL HIGH (ref 0–37)
AST: 335 U/L — ABNORMAL HIGH (ref 0–37)
AST: 543 U/L — ABNORMAL HIGH (ref 0–37)
AST: 62 U/L — ABNORMAL HIGH (ref 0–37)
AST: 66 U/L — ABNORMAL HIGH (ref 0–37)
Albumin: 1.4 g/dL — ABNORMAL LOW (ref 3.5–5.2)
Albumin: 1.7 g/dL — ABNORMAL LOW (ref 3.5–5.2)
Albumin: 1.9 g/dL — ABNORMAL LOW (ref 3.5–5.2)
Albumin: 2.6 g/dL — ABNORMAL LOW (ref 3.5–5.2)
Alkaline Phosphatase: 142 U/L — ABNORMAL HIGH (ref 39–117)
Alkaline Phosphatase: 143 U/L — ABNORMAL HIGH (ref 39–117)
Alkaline Phosphatase: 161 U/L — ABNORMAL HIGH (ref 39–117)
Alkaline Phosphatase: 281 U/L — ABNORMAL HIGH (ref 39–117)
Alkaline Phosphatase: 65 U/L (ref 39–117)
BUN: 16 mg/dL (ref 6–23)
BUN: 17 mg/dL (ref 6–23)
BUN: 22 mg/dL (ref 6–23)
CO2: 19 mEq/L (ref 19–32)
CO2: 25 mEq/L (ref 19–32)
CO2: 26 mEq/L (ref 19–32)
CO2: 26 mEq/L (ref 19–32)
CO2: 29 mEq/L (ref 19–32)
Calcium: 6.7 mg/dL — ABNORMAL LOW (ref 8.4–10.5)
Calcium: 7.1 mg/dL — ABNORMAL LOW (ref 8.4–10.5)
Calcium: 7.1 mg/dL — ABNORMAL LOW (ref 8.4–10.5)
Calcium: 7.4 mg/dL — ABNORMAL LOW (ref 8.4–10.5)
Chloride: 100 mEq/L (ref 96–112)
Chloride: 103 mEq/L (ref 96–112)
Chloride: 112 mEq/L (ref 96–112)
Chloride: 113 mEq/L — ABNORMAL HIGH (ref 96–112)
Creatinine, Ser: 0.53 mg/dL (ref 0.4–1.2)
Creatinine, Ser: 0.87 mg/dL (ref 0.4–1.2)
GFR calc Af Amer: 35 mL/min — ABNORMAL LOW (ref >60)
GFR calc Af Amer: >60 mL/min (ref >60)
GFR calc Af Amer: >60 mL/min (ref >60)
GFR calc Af Amer: >60 mL/min (ref >60)
GFR calc Af Amer: >60 mL/min (ref >60)
GFR calc non Af Amer: 29 mL/min — ABNORMAL LOW (ref >60)
GFR calc non Af Amer: >60 mL/min (ref >60)
GFR calc non Af Amer: >60 mL/min (ref >60)
GFR calc non Af Amer: >60 mL/min (ref >60)
GFR calc non Af Amer: >60 mL/min (ref >60)
Glucose, Bld: 115 mg/dL — ABNORMAL HIGH (ref 70–99)
Glucose, Bld: 123 mg/dL — ABNORMAL HIGH (ref 70–99)
Glucose, Bld: 133 mg/dL — ABNORMAL HIGH (ref 70–99)
Glucose, Bld: 134 mg/dL — ABNORMAL HIGH (ref 70–99)
Glucose, Bld: 90 mg/dL (ref 70–99)
Potassium: 4 mEq/L (ref 3.5–5.1)
Potassium: 4 mEq/L (ref 3.5–5.1)
Potassium: 4.6 mEq/L (ref 3.5–5.1)
Potassium: 4.9 mEq/L (ref 3.5–5.1)
Sodium: 130 mEq/L — ABNORMAL LOW (ref 135–145)
Sodium: 131 mEq/L — ABNORMAL LOW (ref 135–145)
Sodium: 133 mEq/L — ABNORMAL LOW (ref 135–145)
Sodium: 134 mEq/L — ABNORMAL LOW (ref 135–145)
Sodium: 138 mEq/L (ref 135–145)
Total Bilirubin: 0.9 mg/dL (ref 0.3–1.2)
Total Bilirubin: 1.1 mg/dL (ref 0.3–1.2)
Total Bilirubin: 3.9 mg/dL — ABNORMAL HIGH (ref 0.3–1.2)
Total Protein: 3.5 g/dL — ABNORMAL LOW (ref 6.0–8.3)
Total Protein: 3.8 g/dL — ABNORMAL LOW (ref 6.0–8.3)
Total Protein: 4.1 g/dL — ABNORMAL LOW (ref 6.0–8.3)
Total Protein: 4.4 g/dL — ABNORMAL LOW (ref 6.0–8.3)

## 2011-01-21 LAB — POCT I-STAT 3, ART BLOOD GAS (G3+)
Acid-Base Excess: 1 mmol/L (ref 0.0–2.0)
Acid-Base Excess: 2 mmol/L (ref 0.0–2.0)
Acid-Base Excess: 2 mmol/L (ref 0.0–2.0)
Acid-Base Excess: 3 mmol/L — ABNORMAL HIGH (ref 0.0–2.0)
Acid-Base Excess: 5 mmol/L — ABNORMAL HIGH (ref 0.0–2.0)
Acid-Base Excess: 5 mmol/L — ABNORMAL HIGH (ref 0.0–2.0)
Acid-Base Excess: 5 mmol/L — ABNORMAL HIGH (ref 0.0–2.0)
Acid-base deficit: 1 mmol/L (ref 0.0–2.0)
Acid-base deficit: 1 mmol/L (ref 0.0–2.0)
Acid-base deficit: 1 mmol/L (ref 0.0–2.0)
Acid-base deficit: 5 mmol/L — ABNORMAL HIGH (ref 0.0–2.0)
Bicarbonate: 18 mEq/L — ABNORMAL LOW (ref 20.0–24.0)
Bicarbonate: 19.4 mEq/L — ABNORMAL LOW (ref 20.0–24.0)
Bicarbonate: 19.5 mEq/L — ABNORMAL LOW (ref 20.0–24.0)
Bicarbonate: 20.6 mEq/L (ref 20.0–24.0)
Bicarbonate: 22.5 mEq/L (ref 20.0–24.0)
Bicarbonate: 22.7 mEq/L (ref 20.0–24.0)
Bicarbonate: 24.8 mEq/L — ABNORMAL HIGH (ref 20.0–24.0)
Bicarbonate: 25.1 mEq/L — ABNORMAL HIGH (ref 20.0–24.0)
Bicarbonate: 25.8 mEq/L — ABNORMAL HIGH (ref 20.0–24.0)
Bicarbonate: 29.1 mEq/L — ABNORMAL HIGH (ref 20.0–24.0)
O2 Saturation: 100 %
O2 Saturation: 92 %
O2 Saturation: 94 %
O2 Saturation: 96 %
O2 Saturation: 97 %
O2 Saturation: 97 %
O2 Saturation: 99 %
Patient temperature: 100.3
Patient temperature: 100.6
Patient temperature: 39.5
Patient temperature: 97.8
Patient temperature: 97.9
Patient temperature: 98
Patient temperature: 98.2
Patient temperature: 98.3
Patient temperature: 98.4
Patient temperature: 98.7
Patient temperature: 98.7
Patient temperature: 98.7
Patient temperature: 98.9
TCO2: 19 mmol/L (ref 0–100)
TCO2: 21 mmol/L (ref 0–100)
TCO2: 23 mmol/L (ref 0–100)
TCO2: 24 mmol/L (ref 0–100)
TCO2: 26 mmol/L (ref 0–100)
TCO2: 26 mmol/L (ref 0–100)
TCO2: 26 mmol/L (ref 0–100)
TCO2: 27 mmol/L (ref 0–100)
TCO2: 29 mmol/L (ref 0–100)
TCO2: 30 mmol/L (ref 0–100)
pCO2 arterial: 26.7 mmHg — ABNORMAL LOW (ref 35.0–45.0)
pCO2 arterial: 26.7 mmHg — ABNORMAL LOW (ref 35.0–45.0)
pCO2 arterial: 29.2 mmHg — ABNORMAL LOW (ref 35.0–45.0)
pCO2 arterial: 29.5 mmHg — ABNORMAL LOW (ref 35.0–45.0)
pCO2 arterial: 30.4 mmHg — ABNORMAL LOW (ref 35.0–45.0)
pCO2 arterial: 36.9 mmHg (ref 35.0–45.0)
pCO2 arterial: 38.3 mmHg (ref 35.0–45.0)
pCO2 arterial: 38.9 mmHg (ref 35.0–45.0)
pH, Arterial: 7.382 (ref 7.350–7.400)
pH, Arterial: 7.396 (ref 7.350–7.400)
pH, Arterial: 7.433 — ABNORMAL HIGH (ref 7.350–7.400)
pH, Arterial: 7.455 — ABNORMAL HIGH (ref 7.350–7.400)
pH, Arterial: 7.457 — ABNORMAL HIGH (ref 7.350–7.400)
pH, Arterial: 7.47 — ABNORMAL HIGH (ref 7.350–7.400)
pH, Arterial: 7.48 — ABNORMAL HIGH (ref 7.350–7.400)
pH, Arterial: 7.48 — ABNORMAL HIGH (ref 7.350–7.400)
pH, Arterial: 7.551 — ABNORMAL HIGH (ref 7.350–7.400)
pH, Arterial: 7.559 — ABNORMAL HIGH (ref 7.350–7.400)
pO2, Arterial: 120 mmHg — ABNORMAL HIGH (ref 80.0–100.0)
pO2, Arterial: 175 mmHg — ABNORMAL HIGH (ref 80.0–100.0)
pO2, Arterial: 61 mmHg — ABNORMAL LOW (ref 80.0–100.0)
pO2, Arterial: 66 mmHg — ABNORMAL LOW (ref 80.0–100.0)
pO2, Arterial: 71 mmHg — ABNORMAL LOW (ref 80.0–100.0)
pO2, Arterial: 82 mmHg (ref 80.0–100.0)
pO2, Arterial: 84 mmHg (ref 80.0–100.0)
pO2, Arterial: 91 mmHg (ref 80.0–100.0)

## 2011-01-21 LAB — BASIC METABOLIC PANEL
BUN: 12 mg/dL (ref 6–23)
BUN: 15 mg/dL (ref 6–23)
BUN: 16 mg/dL (ref 6–23)
BUN: 17 mg/dL (ref 6–23)
CO2: 23 mEq/L (ref 19–32)
Calcium: 6.8 mg/dL — ABNORMAL LOW (ref 8.4–10.5)
Calcium: 6.9 mg/dL — ABNORMAL LOW (ref 8.4–10.5)
Calcium: 7.1 mg/dL — ABNORMAL LOW (ref 8.4–10.5)
Calcium: 7.3 mg/dL — ABNORMAL LOW (ref 8.4–10.5)
Calcium: 7.4 mg/dL — ABNORMAL LOW (ref 8.4–10.5)
Chloride: 100 mEq/L (ref 96–112)
Creatinine, Ser: 0.45 mg/dL (ref 0.4–1.2)
Creatinine, Ser: 0.52 mg/dL (ref 0.4–1.2)
GFR calc Af Amer: >60 mL/min (ref >60)
GFR calc Af Amer: >60 mL/min (ref >60)
GFR calc non Af Amer: >60 mL/min (ref >60)
GFR calc non Af Amer: >60 mL/min (ref >60)
GFR calc non Af Amer: >60 mL/min (ref >60)
GFR calc non Af Amer: >60 mL/min (ref >60)
GFR calc non Af Amer: >60 mL/min (ref >60)
Glucose, Bld: 102 mg/dL — ABNORMAL HIGH (ref 70–99)
Glucose, Bld: 110 mg/dL — ABNORMAL HIGH (ref 70–99)
Glucose, Bld: 142 mg/dL — ABNORMAL HIGH (ref 70–99)
Potassium: 3 mEq/L — ABNORMAL LOW (ref 3.5–5.1)
Potassium: 3.3 mEq/L — ABNORMAL LOW (ref 3.5–5.1)
Potassium: 3.3 mEq/L — ABNORMAL LOW (ref 3.5–5.1)
Potassium: 3.4 mEq/L — ABNORMAL LOW (ref 3.5–5.1)
Sodium: 129 mEq/L — ABNORMAL LOW (ref 135–145)
Sodium: 134 mEq/L — ABNORMAL LOW (ref 135–145)
Sodium: 137 mEq/L (ref 135–145)

## 2011-01-21 LAB — MAGNESIUM
Magnesium: 2 mg/dL (ref 1.5–2.5)
Magnesium: 2 mg/dL (ref 1.5–2.5)

## 2011-01-21 LAB — CROSSMATCH
ABO/RH(D): B POS
Antibody Screen: NEGATIVE

## 2011-01-21 LAB — CHOLESTEROL, TOTAL: Cholesterol: 124 mg/dL (ref 0–200)

## 2011-01-21 LAB — CULTURE, BLOOD (ROUTINE X 2)

## 2011-01-21 LAB — DIFFERENTIAL
Basophils Absolute: 0 10*3/uL (ref 0.0–0.1)
Basophils Absolute: 0 10*3/uL (ref 0.0–0.1)
Basophils Absolute: 0 10*3/uL (ref 0.0–0.1)
Basophils Absolute: 0 10*3/uL (ref 0.0–0.1)
Basophils Absolute: 0 10*3/uL (ref 0.0–0.1)
Basophils Relative: 0 % (ref 0–1)
Basophils Relative: 0 % (ref 0–1)
Basophils Relative: 0 % (ref 0–1)
Basophils Relative: 0 % (ref 0–1)
Eosinophils Absolute: 0 10*3/uL (ref 0.0–0.7)
Eosinophils Absolute: 0.1 10*3/uL (ref 0.0–0.7)
Eosinophils Absolute: 0.1 10*3/uL (ref 0.0–0.7)
Eosinophils Absolute: 0.1 10*3/uL (ref 0.0–0.7)
Eosinophils Absolute: 0.1 10*3/uL (ref 0.0–0.7)
Eosinophils Relative: 0 % (ref 0–5)
Eosinophils Relative: 1 % (ref 0–5)
Eosinophils Relative: 1 % (ref 0–5)
Lymphocytes Relative: 13 % (ref 12–46)
Lymphocytes Relative: 18 % (ref 12–46)
Lymphocytes Relative: 6 % — ABNORMAL LOW (ref 12–46)
Lymphocytes Relative: 7 % — ABNORMAL LOW (ref 12–46)
Lymphs Abs: 0.5 10*3/uL — ABNORMAL LOW (ref 0.7–4.0)
Lymphs Abs: 0.6 10*3/uL — ABNORMAL LOW (ref 0.7–4.0)
Lymphs Abs: 0.9 10*3/uL (ref 0.7–4.0)
Monocytes Absolute: 0.6 10*3/uL (ref 0.1–1.0)
Monocytes Absolute: 0.6 10*3/uL (ref 0.1–1.0)
Monocytes Absolute: 0.6 10*3/uL (ref 0.1–1.0)
Monocytes Absolute: 1.3 10*3/uL — ABNORMAL HIGH (ref 0.1–1.0)
Monocytes Relative: 13 % — ABNORMAL HIGH (ref 3–12)
Monocytes Relative: 15 % — ABNORMAL HIGH (ref 3–12)
Monocytes Relative: 5 % (ref 3–12)
Neutro Abs: 3.9 10*3/uL (ref 1.7–7.7)
Neutro Abs: 5.2 10*3/uL (ref 1.7–7.7)
Neutro Abs: 5.3 10*3/uL (ref 1.7–7.7)
Neutro Abs: 8 10*3/uL — ABNORMAL HIGH (ref 1.7–7.7)
Neutrophils Relative %: 77 % (ref 43–77)
Neutrophils Relative %: 77 % (ref 43–77)
Neutrophils Relative %: 89 % — ABNORMAL HIGH (ref 43–77)

## 2011-01-21 LAB — CULTURE, RESPIRATORY W GRAM STAIN

## 2011-01-21 LAB — POCT I-STAT 7, (LYTES, BLD GAS, ICA,H+H)
Acid-base deficit: 1 mmol/L (ref 0.0–2.0)
Bicarbonate: 22.7 mEq/L (ref 20.0–24.0)
HCT: 26 % — ABNORMAL LOW (ref 36.0–46.0)
O2 Saturation: 99 %
Sodium: 133 mEq/L — ABNORMAL LOW (ref 135–145)

## 2011-01-21 LAB — TYPE AND SCREEN
ABO/RH(D): B POS
Antibody Screen: NEGATIVE

## 2011-01-21 LAB — CK TOTAL AND CKMB (NOT AT ARMC)
Relative Index: 0.8 (ref 0.0–2.5)
Relative Index: 1.2 (ref 0.0–2.5)

## 2011-01-21 LAB — POCT I-STAT, CHEM 8
Calcium, Ion: 1.04 mmol/L — ABNORMAL LOW (ref 1.12–1.32)
Glucose, Bld: 276 mg/dL — ABNORMAL HIGH (ref 70–99)
HCT: 32 % — ABNORMAL LOW (ref 36.0–46.0)
Hemoglobin: 10.9 g/dL — ABNORMAL LOW (ref 12.0–15.0)
Potassium: 5 mEq/L (ref 3.5–5.1)

## 2011-01-21 LAB — LIPASE, FLUID: Lipase-Fluid: 76 U/L

## 2011-01-21 LAB — PREALBUMIN: Prealbumin: 6.3 mg/dL — ABNORMAL LOW (ref 18.0–45.0)

## 2011-01-21 LAB — URINE CULTURE

## 2011-01-21 LAB — PREPARE RBC (CROSSMATCH)

## 2011-01-21 LAB — PHOSPHORUS
Phosphorus: 1.9 mg/dL — ABNORMAL LOW (ref 2.3–4.6)
Phosphorus: 3.1 mg/dL (ref 2.3–4.6)
Phosphorus: 3.4 mg/dL (ref 2.3–4.6)
Phosphorus: 3.6 mg/dL (ref 2.3–4.6)
Phosphorus: 3.7 mg/dL (ref 2.3–4.6)

## 2011-01-21 LAB — PROTIME-INR: Prothrombin Time: 16.9 seconds — ABNORMAL HIGH (ref 11.6–15.2)

## 2011-01-21 LAB — TROPONIN I
Troponin I: 0.06 ng/mL (ref 0.00–0.06)
Troponin I: 0.2 ng/mL — ABNORMAL HIGH (ref 0.00–0.06)
Troponin I: 0.51 ng/mL (ref 0.00–0.06)

## 2011-01-21 LAB — PREPARE PLATELETS

## 2011-01-21 LAB — HEMOGLOBIN AND HEMATOCRIT, BLOOD: HCT: 22.9 % — ABNORMAL LOW (ref 36.0–46.0)

## 2011-01-22 LAB — TYPE AND SCREEN: ABO/RH(D): B POS

## 2011-01-22 LAB — CBC
HCT: 19.3 % — ABNORMAL LOW (ref 36.0–46.0)
HCT: 29.6 % — ABNORMAL LOW (ref 36.0–46.0)
HCT: 36.8 % (ref 36.0–46.0)
HCT: 39.2 % (ref 36.0–46.0)
Hemoglobin: 10.4 g/dL — ABNORMAL LOW (ref 12.0–15.0)
Hemoglobin: 13.7 g/dL (ref 12.0–15.0)
Hemoglobin: 6.7 g/dL — CL (ref 12.0–15.0)
MCHC: 34.9 g/dL (ref 30.0–36.0)
MCHC: 35 g/dL (ref 30.0–36.0)
MCV: 91.3 fL (ref 78.0–100.0)
MCV: 92.9 fL (ref 78.0–100.0)
MCV: 93.5 fL (ref 78.0–100.0)
Platelets: 176 10*3/uL (ref 150–400)
RBC: 3.24 MIL/uL — ABNORMAL LOW (ref 3.87–5.11)
RBC: 3.94 MIL/uL (ref 3.87–5.11)
RDW: 12.7 % (ref 11.5–15.5)
RDW: 13.9 % (ref 11.5–15.5)
WBC: 11.5 10*3/uL — ABNORMAL HIGH (ref 4.0–10.5)
WBC: 8.7 10*3/uL (ref 4.0–10.5)

## 2011-01-22 LAB — POCT I-STAT 7, (LYTES, BLD GAS, ICA,H+H)
Acid-base deficit: 1 mmol/L (ref 0.0–2.0)
HCT: 22 % — ABNORMAL LOW (ref 36.0–46.0)
Hemoglobin: 10.5 g/dL — ABNORMAL LOW (ref 12.0–15.0)
Hemoglobin: 7.5 g/dL — ABNORMAL LOW (ref 12.0–15.0)
O2 Saturation: 100 %
Patient temperature: 36.1
Potassium: 4.3 mEq/L (ref 3.5–5.1)
Potassium: 4.4 mEq/L (ref 3.5–5.1)
Sodium: 142 mEq/L (ref 135–145)
TCO2: 21 mmol/L (ref 0–100)
TCO2: 26 mmol/L (ref 0–100)
pCO2 arterial: 38.6 mmHg (ref 35.0–45.0)
pH, Arterial: 7.318 — ABNORMAL LOW (ref 7.350–7.400)
pH, Arterial: 7.353 (ref 7.350–7.400)

## 2011-01-22 LAB — COMPREHENSIVE METABOLIC PANEL
Alkaline Phosphatase: 69 U/L (ref 39–117)
BUN: 14 mg/dL (ref 6–23)
Calcium: 8.7 mg/dL (ref 8.4–10.5)
Glucose, Bld: 128 mg/dL — ABNORMAL HIGH (ref 70–99)
Potassium: 4 mEq/L (ref 3.5–5.1)
Total Protein: 6.3 g/dL (ref 6.0–8.3)

## 2011-01-22 LAB — CARDIAC PANEL(CRET KIN+CKTOT+MB+TROPI)
CK, MB: 29.5 ng/mL — ABNORMAL HIGH (ref 0.3–4.0)
Relative Index: 1.6 (ref 0.0–2.5)
Total CK: 1895 U/L — ABNORMAL HIGH (ref 7–177)
Troponin I: 0.06 ng/mL (ref 0.00–0.06)

## 2011-01-22 LAB — BASIC METABOLIC PANEL
BUN: 12 mg/dL (ref 6–23)
BUN: 9 mg/dL (ref 6–23)
CO2: 19 mEq/L (ref 19–32)
CO2: 19 mEq/L (ref 19–32)
Calcium: 6.9 mg/dL — ABNORMAL LOW (ref 8.4–10.5)
Chloride: 107 mEq/L (ref 96–112)
Chloride: 112 mEq/L (ref 96–112)
Chloride: 115 mEq/L — ABNORMAL HIGH (ref 96–112)
Creatinine, Ser: 1.12 mg/dL (ref 0.4–1.2)
GFR calc Af Amer: 59 mL/min — ABNORMAL LOW (ref >60)
GFR calc Af Amer: >60 mL/min (ref >60)
GFR calc non Af Amer: 48 mL/min — ABNORMAL LOW (ref >60)
Glucose, Bld: 224 mg/dL — ABNORMAL HIGH (ref 70–99)
Glucose, Bld: 244 mg/dL — ABNORMAL HIGH (ref 70–99)
Potassium: 3.9 mEq/L (ref 3.5–5.1)
Potassium: 4.1 mEq/L (ref 3.5–5.1)
Potassium: 5 mEq/L (ref 3.5–5.1)
Sodium: 137 mEq/L (ref 135–145)
Sodium: 139 mEq/L (ref 135–145)

## 2011-01-22 LAB — POCT I-STAT, CHEM 8
BUN: 14 mg/dL (ref 6–23)
Calcium, Ion: 0.91 mmol/L — ABNORMAL LOW (ref 1.12–1.32)
Calcium, Ion: 1.08 mmol/L — ABNORMAL LOW (ref 1.12–1.32)
Chloride: 94 mEq/L — ABNORMAL LOW (ref 96–112)
Creatinine, Ser: 1.2 mg/dL (ref 0.4–1.2)
Glucose, Bld: 264 mg/dL — ABNORMAL HIGH (ref 70–99)
Glucose, Bld: 42 mg/dL — ABNORMAL LOW (ref 70–99)
HCT: 34 % — ABNORMAL LOW (ref 36.0–46.0)
Hemoglobin: 11.6 g/dL — ABNORMAL LOW (ref 12.0–15.0)
TCO2: 21 mmol/L (ref 0–100)

## 2011-01-22 LAB — PROTIME-INR
Prothrombin Time: 13.6 seconds (ref 11.6–15.2)
Prothrombin Time: 13.7 seconds (ref 11.6–15.2)
Prothrombin Time: 17.6 seconds — ABNORMAL HIGH (ref 11.6–15.2)

## 2011-01-22 LAB — POCT I-STAT 3, ART BLOOD GAS (G3+)
O2 Saturation: 99 %
pCO2 arterial: 31.4 mmHg — ABNORMAL LOW (ref 35.0–45.0)
pH, Arterial: 7.369 (ref 7.350–7.400)

## 2011-01-22 LAB — GLUCOSE, CAPILLARY
Glucose-Capillary: 185 mg/dL — ABNORMAL HIGH (ref 70–99)
Glucose-Capillary: 220 mg/dL — ABNORMAL HIGH (ref 70–99)

## 2011-01-22 LAB — CK TOTAL AND CKMB (NOT AT ARMC): Relative Index: 1.2 (ref 0.0–2.5)

## 2011-01-22 LAB — MRSA PCR SCREENING: MRSA by PCR: NEGATIVE

## 2011-01-22 LAB — LACTIC ACID, PLASMA: Lactic Acid, Venous: 2 mmol/L (ref 0.5–2.2)

## 2011-01-22 LAB — TROPONIN I: Troponin I: 0.37 ng/mL — ABNORMAL HIGH (ref 0.00–0.06)

## 2011-03-04 NOTE — Assessment & Plan Note (Signed)
Shari Dean                            CARDIOLOGY OFFICE NOTE   Shari Dean, Shari Dean                  MRN:          [id number]  DATE:01/18/2008                            DOB:          [birth year]    PRIMARY CARE PHYSICIAN:  Dr. Myles Rosenthal, [facility].   REFERRING:  Dr. Kelli Hope.   REASON FOR CONSULTATION:  Evaluate the patient with tachycardia.   HISTORY OF PRESENT ILLNESS:  The patient is a pleasant 70 year old who  was recently seen by Dr. Thad Ranger for evaluation of memory disorder.  She was noted on that examination to have a heart rate of 180.  She  reports no symptoms with that.  When they finally got the EKG, it was  done to 110.  I am trying to retrieve that EKG.  Again the patient  denied any palpitations, presyncope or syncope.  She did not have any  chest pressure, neck or arm discomfort.  She had no shortness of breath.   She reports she is quite active.  She walks frequently.  She climbs up  the hill near her house.  With this, she does not develop any symptoms.  She has had thyroid testing which has been normal.  She denies any  weight loss, hair change, voice change.  Again, she is being evaluated  for her memory disorder, but she thinks she is fine.  She does have  follow-up with Dr. Thad Ranger to include an MRI and questionable PET  scanning.  Holter monitor was to be placed, but has not yet been done.   PAST MEDICAL HISTORY:  The patient denies any history of hypertension,  diabetes, hyperlipidemia or heart failure.   PAST SURGICAL HISTORY:  1. Hemorrhoidectomy.  2. Hysterectomy.   ALLERGIES:  None.   MEDICATIONS:  1. Multivitamin.  2. Aricept 10 mg daily.   SOCIAL HISTORY:  The patient is a bookkeeper.  She is married.  She has  seven children.  She smoked only briefly.  Has not smoked in years.  She  drinks alcohol socially.  Family history is contributory for father  having myocardial infarction in  3.  Otherwise, there is no early  heart disease, heart failure or arrhythmias.   REVIEW OF SYSTEMS:  As stated in the HPI and positive for dentures,  eyeglasses.  Negative for all other systems.   PHYSICAL EXAMINATION:  GENERAL:  The patient is in no distress.  VITAL SIGNS:  Blood pressure 122/78, heart rate 77 and regular, weight  122 pounds, body mass index 22.  HEENT:  Eyes unremarkable, pupils equal, round, reactive to light.  Fundi within normal limits, oral mucosa unremarkable, edentulous,  dentures.  NECK:  No jugular venous distention at 45 degrees, carotid  upstroke brisk and symmetric, no bruits, thyromegaly.  LYMPHATICS:  No cervical, axillary or inguinal adenopathy.  LUNGS:  Clear to auscultation bilaterally.  BACK:  No costovertebral tenderness.  CHEST:  Unremarkable.  HEART:  PMI not displaced or sustained, S1-S2 within normal.  No S3, S4,  no clicks, rubs, murmurs.  ABDOMEN:  Flat, positive bowel sounds normal in frequency pitch,  no  bruits, rebound, guarding or midline pulsatile mass.  No hepatomegaly,  splenomegaly.  SKIN:  No rashes, no nodules.  EXTREMITIES:  Pulses 2+ throughout, no edema.  No cyanosis or clubbing.  NEUROLOGICAL:  Oriented to place, time, cranial nerves II-XII grossly  intact, motor grossly intact.   STUDIES:  EKG sinus rhythm, rate 77, axis within normal limits,  intervals within normal limits, no acute ST-T wave changes.   ASSESSMENT/PLAN:  1. Tachycardia.  The patient does not report any symptoms.  She had      normal thyroid studies.  However, she had a documented heart rate      of 180 apparently by nurses.  An EKG apparently objectively, shows      it to be 110 at the time of her appointment with Dr. Thad Ranger.  I      am trying to clarify what has been documented and obtain that EKG.      I will place a 24-hour Holter monitor to see if there is any      evidence of dysrhythmia.  If this comes back normal.  I would not      suggest  further cardiovascular testing in the absence of symptoms.  2. Memory disorder.  The patient is continuing an evaluation with Dr.      Thad Ranger.  3. Follow-up will be based on the results of the Holter or future      symptoms.     Rollene Rotunda, MD, [facility]  Electronically Signed    JH/MedQ  DD: 01/18/2008  DT: 01/18/2008  Job #: [id number]   cc:   Casimiro Needle L. Thad Ranger, M.D.  Myles Rosenthal, M.D.

## 2011-07-07 ENCOUNTER — Ambulatory Visit: Payer: Self-pay | Admitting: Internal Medicine

## 2011-08-06 ENCOUNTER — Emergency Department (HOSPITAL_COMMUNITY): Payer: Medicare Other

## 2011-08-06 ENCOUNTER — Emergency Department (HOSPITAL_COMMUNITY)
Admission: EM | Admit: 2011-08-06 | Discharge: 2011-08-07 | Disposition: A | Discharge status: Other Acute Inpatient Hospital | Payer: Medicare Other | Attending: Emergency Medicine | Admitting: Emergency Medicine

## 2011-08-06 DIAGNOSIS — R4182 Altered mental status, unspecified: Secondary | ICD-10-CM | POA: Insufficient documentation

## 2011-08-06 DIAGNOSIS — F03918 Unspecified dementia, unspecified severity, with other behavioral disturbance: Secondary | ICD-10-CM | POA: Insufficient documentation

## 2011-08-06 DIAGNOSIS — F0391 Unspecified dementia with behavioral disturbance: Secondary | ICD-10-CM | POA: Insufficient documentation

## 2011-08-06 LAB — DIFFERENTIAL
Basophils Absolute: 0 10*3/uL (ref 0.0–0.1)
Eosinophils Absolute: 0.1 10*3/uL (ref 0.0–0.7)
Eosinophils Relative: 2 % (ref 0–5)
Monocytes Absolute: 0.4 10*3/uL (ref 0.1–1.0)

## 2011-08-06 LAB — COMPREHENSIVE METABOLIC PANEL
ALT: 14 U/L (ref 0–35)
AST: 17 U/L (ref 0–37)
CO2: 27 mEq/L (ref 19–32)
Chloride: 100 mEq/L (ref 96–112)
Creatinine, Ser: 0.77 mg/dL (ref 0.50–1.10)
GFR calc non Af Amer: 83 mL/min — ABNORMAL LOW (ref >90)
Total Bilirubin: 0.2 mg/dL — ABNORMAL LOW (ref 0.3–1.2)

## 2011-08-06 LAB — URINALYSIS, ROUTINE W REFLEX MICROSCOPIC
Bilirubin Urine: NEGATIVE
Ketones, ur: NEGATIVE mg/dL
Protein, ur: NEGATIVE mg/dL
Urobilinogen, UA: 0.2 mg/dL (ref 0.0–1.0)

## 2011-08-06 LAB — CBC
MCHC: 33.2 g/dL (ref 30.0–36.0)
MCV: 89.3 fL (ref 78.0–100.0)
Platelets: 203 10*3/uL (ref 150–400)
RDW: 13.2 % (ref 11.5–15.5)
WBC: 4.6 10*3/uL (ref 4.0–10.5)

## 2011-08-06 LAB — RAPID URINE DRUG SCREEN, HOSP PERFORMED
Barbiturates: NOT DETECTED
Cocaine: NOT DETECTED

## 2011-08-07 LAB — URINE CULTURE
Colony Count: 7000
Culture  Setup Time: 201210180217

## 2011-10-05 ENCOUNTER — Emergency Department (HOSPITAL_COMMUNITY)
Admission: EM | Admit: 2011-10-05 | Discharge: 2011-10-05 | Disposition: A | Discharge status: Home / Self Care | Payer: Medicare Other | Attending: Emergency Medicine | Admitting: Emergency Medicine

## 2011-10-05 ENCOUNTER — Emergency Department (HOSPITAL_COMMUNITY): Payer: Medicare Other

## 2011-10-05 DIAGNOSIS — T1490XA Injury, unspecified, initial encounter: Secondary | ICD-10-CM | POA: Insufficient documentation

## 2011-10-05 DIAGNOSIS — F0390 Unspecified dementia without behavioral disturbance: Secondary | ICD-10-CM | POA: Insufficient documentation

## 2011-10-05 DIAGNOSIS — J320 Chronic maxillary sinusitis: Secondary | ICD-10-CM | POA: Insufficient documentation

## 2011-10-05 DIAGNOSIS — Y9210 Unspecified residential institution as the place of occurrence of the external cause: Secondary | ICD-10-CM | POA: Insufficient documentation

## 2011-10-05 DIAGNOSIS — W19XXXA Unspecified fall, initial encounter: Secondary | ICD-10-CM | POA: Insufficient documentation

## 2011-10-05 DIAGNOSIS — I6789 Other cerebrovascular disease: Secondary | ICD-10-CM | POA: Insufficient documentation

## 2011-10-05 DIAGNOSIS — N390 Urinary tract infection, site not specified: Secondary | ICD-10-CM | POA: Insufficient documentation

## 2011-10-05 LAB — DIFFERENTIAL
Basophils Absolute: 0 10*3/uL (ref 0.0–0.1)
Eosinophils Relative: 6 % — ABNORMAL HIGH (ref 0–5)
Lymphocytes Relative: 35 % (ref 12–46)
Neutro Abs: 1.7 10*3/uL (ref 1.7–7.7)

## 2011-10-05 LAB — URINALYSIS, ROUTINE W REFLEX MICROSCOPIC
Glucose, UA: NEGATIVE mg/dL
Nitrite: POSITIVE — AB
Protein, ur: NEGATIVE mg/dL

## 2011-10-05 LAB — BASIC METABOLIC PANEL
CO2: 32 mEq/L (ref 19–32)
Calcium: 9.5 mg/dL (ref 8.4–10.5)
Chloride: 103 mEq/L (ref 96–112)
GFR calc Af Amer: >90 mL/min (ref >90)
Sodium: 139 mEq/L (ref 135–145)

## 2011-10-05 LAB — CBC
Platelets: 139 10*3/uL — ABNORMAL LOW (ref 150–400)
RDW: 14.3 % (ref 11.5–15.5)
WBC: 3.9 10*3/uL — ABNORMAL LOW (ref 4.0–10.5)

## 2011-10-05 LAB — URINE MICROSCOPIC-ADD ON

## 2011-10-05 MED ORDER — CEPHALEXIN 500 MG PO CAPS
500.0000 mg | ORAL_CAPSULE | Freq: Once | ORAL | Status: AC
  Administered 2011-10-05: 500 mg via ORAL
  Filled 2011-10-05: qty 1

## 2011-10-05 MED ORDER — CEPHALEXIN 500 MG PO CAPS: 500.0000 mg | ORAL_CAPSULE | Freq: Four times a day (QID) | ORAL | Status: AC

## 2011-10-05 NOTE — ED Notes (Signed)
Lab called and urine culture has been added on

## 2011-10-05 NOTE — ED Notes (Signed)
[facility] called and nursing home called. Pt is going back to nursing home

## 2011-10-05 NOTE — ED Notes (Signed)
ZOX:[id number]<AV> Expected date:10/05/11<BR> Expected time: 6:28 AM<BR> Means of arrival:Ambulance<BR> Comments:<BR> Larey Seat out of bed. Maybe.

## 2011-10-05 NOTE — ED Provider Notes (Signed)
History     CSN: [id number] Arrival date & time: 10/05/2011  6:44 AM   First MD Initiated Contact with Patient 10/05/11 [phone]      Chief Complaint  Patient presents with  . Fall     (Consider location/radiation/quality/duration/timing/severity/associated sxs/prior treatment) Patient is a 70 y.o. female presenting with fall. The history is provided by the nursing home. The history is limited by the condition of the patient (Dementia).  Fall  Patient was found on the floor at the nursing home and was noted to be wet. She was unable to explain how she ended up on the floor and staff is uncertain if she fell. She is not complaining of anything at this time.  No past medical history on file.  No past surgical history on file.  No family history on file.  History  Substance Use Topics  . Smoking status: Not on file  . Smokeless tobacco: Not on file  . Alcohol Use: Not on file    OB History    No data available      Review of Systems  Unable to perform ROS: Dementia    Allergies  Review of patient's allergies indicates no known allergies.  Home Medications   Current Outpatient Rx  Name Route Sig Dispense Refill  . ASPIRIN EC 81 MG PO TBEC Oral Take 81 mg by mouth daily.        BP 124/73  Pulse 66  Temp(Src) 97 F (36.1 C) (Oral)  Resp 18  SpO2 99%  Physical Exam  Nursing note and vitals reviewed.  70 year old female resting comfortably and in no acute distress. Vital signs are normal. Head is normocephalic and atraumatic. PERRLA, EOMI. Oropharynx is clear. Neck is supple without adenopathy or JVD. Back is nontender. Lungs are clear without rales, wheezes, or rhonchi. Heart has regular rate and rhythm without murmur. There is no chest wall tenderness. Abdomen is soft, flat, nontender without masses or hepatosplenomegaly. Extremities have full range of motion, no cyanosis or edema. Skin is warm and moist without rash. Neurologic: She is oriented to person and place,  but not to time or reason that she is here. Cranial nerves are intact. There no motor or sensory deficits. Deep tendon reflexes are symmetric.  ED Course  Procedures (including critical care time)   Labs Reviewed  CBC  DIFFERENTIAL  BASIC METABOLIC PANEL  URINALYSIS, ROUTINE W REFLEX MICROSCOPIC   No results found. Results for orders placed during the hospital encounter of 10/05/11  CBC      Component Value Range   WBC 3.9 (*) 4.0 - 10.5 (K/uL)   RBC 4.06  3.87 - 5.11 (MIL/uL)   Hemoglobin 12.3  12.0 - 15.0 (g/dL)   HCT 27.2  53.6 - 64.4 (%)   MCV 90.6  78.0 - 100.0 (fL)   MCH 30.3  26.0 - 34.0 (pg)   MCHC 33.4  30.0 - 36.0 (g/dL)   RDW 03.4  74.2 - 59.5 (%)   Platelets 139 (*) 150 - 400 (K/uL)  DIFFERENTIAL      Component Value Range   Neutrophils Relative 45  43 - 77 (%)   Neutro Abs 1.7  1.7 - 7.7 (K/uL)   Lymphocytes Relative 35  12 - 46 (%)   Lymphs Abs 1.4  0.7 - 4.0 (K/uL)   Monocytes Relative 13 (*) 3 - 12 (%)   Monocytes Absolute 0.5  0.1 - 1.0 (K/uL)   Eosinophils Relative 6 (*) 0 - 5 (%)  Eosinophils Absolute 0.2  0.0 - 0.7 (K/uL)   Basophils Relative 1  0 - 1 (%)   Basophils Absolute 0.0  0.0 - 0.1 (K/uL)  BASIC METABOLIC PANEL      Component Value Range   Sodium 139  135 - 145 (mEq/L)   Potassium 4.1  3.5 - 5.1 (mEq/L)   Chloride 103  96 - 112 (mEq/L)   CO2 32  19 - 32 (mEq/L)   Glucose, Bld 83  70 - 99 (mg/dL)   BUN 27 (*) 6 - 23 (mg/dL)   Creatinine, Ser 0.45  0.50 - 1.10 (mg/dL)   Calcium 9.5  8.4 - 40.9 (mg/dL)   GFR calc non Af Amer 83 (*) >90 (mL/min)   GFR calc Af Amer >90  >90 (mL/min)  URINALYSIS, ROUTINE W REFLEX MICROSCOPIC      Component Value Range   Color, Urine YELLOW  YELLOW    APPearance CLOUDY (*) CLEAR    Specific Gravity, Urine 1.019  1.005 - 1.030    pH 6.5  5.0 - 8.0    Glucose, UA NEGATIVE  NEGATIVE (mg/dL)   Hgb urine dipstick NEGATIVE  NEGATIVE    Bilirubin Urine NEGATIVE  NEGATIVE    Ketones, ur NEGATIVE  NEGATIVE  (mg/dL)   Protein, ur NEGATIVE  NEGATIVE (mg/dL)   Urobilinogen, UA 0.2  0.0 - 1.0 (mg/dL)   Nitrite POSITIVE (*) NEGATIVE    Leukocytes, UA LARGE (*) NEGATIVE   URINE MICROSCOPIC-ADD ON      Component Value Range   Squamous Epithelial / LPF RARE  RARE    WBC, UA TOO NUMEROUS TO COUNT  <3 (WBC/hpf)   RBC / HPF 0-2  <3 (RBC/hpf)   Bacteria, UA MANY (*) RARE    Ct Head Wo Contrast  10/05/2011  *RADIOLOGY REPORT*  Clinical Data: Fall.  CT HEAD WITHOUT CONTRAST  Technique:  Contiguous axial images were obtained from the base of the skull through the vertex without contrast.  Comparison: 08/06/2011  Findings: There is moderate central and cortical atrophy. Periventricular white matter changes are consistent with small vessel disease.  There is no evidence for hemorrhage, mass lesion, or acute infarction.  Bone windows show mucoperiosteal thickening in the maxillary sinuses.  No calvarial fracture.  IMPRESSION:  1.  Atrophy and small vessel disease. 2. No evidence for acute intracranial abnormality. 3.  Chronic maxillary sinusitis.  Original Report Authenticated By: Patterson Hammersmith, M.D.      No diagnosis found.  Workup is significant for a UTI. She will be started on Keflex and urine cultures sent. There is no evidence of significant injury from her fall.  MDM  Fall without evidence of injury.        Dione Booze, MD 10/05/11 [phone]

## 2011-10-05 NOTE — ED Notes (Signed)
Pt assessed, pt is responding to questions but not very talkative, not communicative much with this RN. Report not knowing why she is here. Denied any pain. Able to move all extremities, ABC intact. nad noted.

## 2011-10-05 NOTE — ED Notes (Signed)
Pt going back to nursing home by [facility]

## 2011-10-05 NOTE — ED Notes (Signed)
Pt 's son called and sts that he is out of town. Pt informed that she will be admitted.

## 2011-10-05 NOTE — ED Notes (Signed)
Per EMS: pt from SNF, [facility], staff states that they are not sure if the pt fell, pt found by the staff, on the floor, however notable to provide a story. Pt denies any injury, no c/o pain, pt found wet, clothing and mattress, no evident injury to the head or skin at this time, no busing noted.

## 2011-10-05 NOTE — ED Notes (Signed)
[facility] at bedside 

## 2011-10-08 LAB — URINE CULTURE: Colony Count: >=100000

## 2011-10-09 NOTE — ED Notes (Signed)
+   Urine Patient treated with Keflex-sensitive to same-chart appended per protocol MD. 

## 2011-10-12 ENCOUNTER — Emergency Department (HOSPITAL_COMMUNITY)
Admission: EM | Admit: 2011-10-12 | Discharge: 2011-10-12 | Payer: Medicare Other | Attending: Emergency Medicine | Admitting: Emergency Medicine

## 2011-10-12 ENCOUNTER — Other Ambulatory Visit: Payer: Self-pay

## 2011-10-12 ENCOUNTER — Emergency Department (HOSPITAL_COMMUNITY): Payer: Medicare Other

## 2011-10-12 DIAGNOSIS — W19XXXA Unspecified fall, initial encounter: Secondary | ICD-10-CM | POA: Insufficient documentation

## 2011-10-12 DIAGNOSIS — G8929 Other chronic pain: Secondary | ICD-10-CM | POA: Insufficient documentation

## 2011-10-12 DIAGNOSIS — R404 Transient alteration of awareness: Secondary | ICD-10-CM | POA: Insufficient documentation

## 2011-10-12 DIAGNOSIS — R32 Unspecified urinary incontinence: Secondary | ICD-10-CM | POA: Insufficient documentation

## 2011-10-12 DIAGNOSIS — R109 Unspecified abdominal pain: Secondary | ICD-10-CM | POA: Insufficient documentation

## 2011-10-12 DIAGNOSIS — F0390 Unspecified dementia without behavioral disturbance: Secondary | ICD-10-CM | POA: Insufficient documentation

## 2011-10-12 DIAGNOSIS — Z79899 Other long term (current) drug therapy: Secondary | ICD-10-CM | POA: Insufficient documentation

## 2011-10-12 DIAGNOSIS — R1110 Vomiting, unspecified: Secondary | ICD-10-CM | POA: Insufficient documentation

## 2011-10-12 DIAGNOSIS — Y9210 Unspecified residential institution as the place of occurrence of the external cause: Secondary | ICD-10-CM | POA: Insufficient documentation

## 2011-10-12 LAB — COMPREHENSIVE METABOLIC PANEL
ALT: 10 U/L (ref 0–35)
Albumin: 3.5 g/dL (ref 3.5–5.2)
Alkaline Phosphatase: 65 U/L (ref 39–117)
Calcium: 9.2 mg/dL (ref 8.4–10.5)
GFR calc Af Amer: >90 mL/min (ref >90)
Potassium: 3.7 mEq/L (ref 3.5–5.1)
Sodium: 136 mEq/L (ref 135–145)
Total Protein: 6.8 g/dL (ref 6.0–8.3)

## 2011-10-12 LAB — URINALYSIS, ROUTINE W REFLEX MICROSCOPIC
Bilirubin Urine: NEGATIVE
Ketones, ur: NEGATIVE mg/dL
Leukocytes, UA: NEGATIVE
Nitrite: NEGATIVE
Protein, ur: NEGATIVE mg/dL
Urobilinogen, UA: 0.2 mg/dL (ref 0.0–1.0)
pH: 8 (ref 5.0–8.0)

## 2011-10-12 LAB — CBC
MCH: 30.1 pg (ref 26.0–34.0)
MCHC: 33.8 g/dL (ref 30.0–36.0)
Platelets: 130 10*3/uL — ABNORMAL LOW (ref 150–400)
RDW: 13.9 % (ref 11.5–15.5)

## 2011-10-12 MED ORDER — ONDANSETRON HCL 4 MG/2ML IJ SOLN: 4.0000 mg | Freq: Once | INTRAMUSCULAR | Status: DC

## 2011-10-12 MED ORDER — SODIUM CHLORIDE 0.9 % IV SOLN
INTRAVENOUS | Status: DC
  Administered 2011-10-12: 11:00:00 via INTRAVENOUS

## 2011-10-12 NOTE — ED Notes (Signed)
CBG 119, NSR, 20 ga piv rt. Fa.

## 2011-10-12 NOTE — ED Provider Notes (Addendum)
History     CSN: [id number]  Arrival date & time 10/12/11  3086   First MD Initiated Contact with Patient 10/12/11 [phone]      No chief complaint on file.   (Consider location/radiation/quality/duration/timing/severity/associated sxs/prior treatment) The history is provided by the patient, the EMS personnel and the nursing home. The history is limited by the absence of a caregiver.   Patient is a 70 year old female from nursing home brought in by EMS, patient without any specific complaints were some question of dementia. Level V caveat applies. No evidence of any visible trauma. Patient has a history of chronic abdominal pain. Nursing home reported the patient was found on the floor questionable fall complaint abdominal pain and vomited and was incontinent.   No past medical history on file.  No past surgical history on file.  No family history on file.  History  Substance Use Topics  . Smoking status: Not on file  . Smokeless tobacco: Not on file  . Alcohol Use: Not on file    OB History    No data available      Review of Systems  Unable to perform ROS  patient with dementia and nonlabored able to obtain accurate review of system.  Allergies  Review of patient's allergies indicates no known allergies.  Home Medications   Current Outpatient Rx  Name Route Sig Dispense Refill  . ALPRAZOLAM 0.25 MG PO TBDP Oral Take 0.25 mg by mouth 4 (four) times daily as needed. For anxiety/agitation     . CEPHALEXIN 500 MG PO CAPS Oral Take 1 capsule (500 mg total) by mouth 4 (four) times daily. 40 capsule 0  . CITALOPRAM HYDROBROMIDE 20 MG PO TABS Oral Take 20 mg by mouth daily.      Marland Kitchen DIVALPROEX SODIUM ER 250 MG PO TB24 Oral Take 250 mg by mouth daily.      . MELOXICAM 7.5 MG PO TABS Oral Take 7.5 mg by mouth 3 (three) times daily with meals.      Marland Kitchen PERPHENAZINE 4 MG PO TABS Oral Take 4 mg by mouth 2 (two) times daily.      Marland Kitchen PERPHENAZINE 8 MG PO TABS Oral Take 8 mg by mouth  at bedtime.        BP 136/67  Temp(Src) 97.9 F (36.6 C) (Oral)  SpO2 100%  Physical Exam  Nursing note and vitals reviewed. Constitutional: She appears well-developed and well-nourished. No distress.  HENT:  Head: Normocephalic and atraumatic.  Mouth/Throat: Oropharynx is clear and moist.  Eyes: Conjunctivae and EOM are normal. Pupils are equal, round, and reactive to light.  Neck: Normal range of motion. Neck supple.  Cardiovascular: Normal rate, regular rhythm and normal heart sounds.   No murmur heard. Pulmonary/Chest: Effort normal and breath sounds normal. No respiratory distress.  Abdominal: Soft. Bowel sounds are normal. There is no tenderness.  Musculoskeletal: Normal range of motion. She exhibits no tenderness.  Neurological: She is alert. No cranial nerve deficit. She exhibits normal muscle tone. Coordination normal.  Skin: Skin is warm. No rash noted.    ED Course  Procedures (including critical care time)  Labs Reviewed  CBC - Abnormal; Notable for the following:    Platelets 130 (*)    All other components within normal limits  COMPREHENSIVE METABOLIC PANEL - Abnormal; Notable for the following:    Glucose, Bld 104 (*)    GFR calc non Af Amer 90 (*)    All other components within normal limits  URINALYSIS, ROUTINE W REFLEX MICROSCOPIC   Ct Abdomen Pelvis Wo Contrast  10/12/2011  *RADIOLOGY REPORT*  Clinical Data: Vomiting.  Incontinence.  CT ABDOMEN AND PELVIS WITHOUT CONTRAST  Technique:  Multidetector CT imaging of the abdomen and pelvis was performed following the standard protocol without intravenous contrast.  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  No effusions. Heart is mildly enlarged.  IVC filter is in place within the infrarenal IVC.  Liver, spleen, pancreas, adrenals and kidneys have an unremarkable unenhanced appearance.  Gallbladder not visualized,  presumably prior cholecystectomy.  Large stool burden throughout the colon.  Small bowel is decompressed.  Aorta and iliac vessels are heavily calcified. There is sigmoid diverticulosis.  No active diverticulitis.  Prior hysterectomy.  No adnexal masses, free fluid free air.  No acute bony abnormality.  Degenerative changes in the thoracolumbar spine.  IMPRESSION: Large stool burden throughout the colon.  Sigmoid diverticulosis.  No acute findings in the abdomen or pelvis.  Original Report Authenticated By: Cyndie Chime, M.D.   Dg Hip Bilateral W/pelvis  10/12/2011  *RADIOLOGY REPORT*  Clinical Data: Fall, altered level of consciousness.  BILATERAL HIP WITH PELVIS - 4+ VIEW  Comparison: CT 09/25/2009.  Findings: Degenerative changes in the hips bilaterally, left greater than right with joint space narrowing, osteophyte formation and subchondral sclerosis.  Diffuse osteopenia. No acute bony abnormality.  Specifically, no fracture, subluxation, or dislocation.  Soft tissues are intact.  IMPRESSION: No acute bony abnormality.  Original Report Authenticated By: Cyndie Chime, M.D.   Ct Head Wo Contrast  10/12/2011  *RADIOLOGY REPORT*  Clinical Data: Fall.  Vomiting.  And, to a.  CT HEAD WITHOUT CONTRAST  Technique:  Contiguous axial images were obtained from the base of the skull through the vertex without contrast.  Comparison: 10/05/2011.  Findings: There is atrophy and chronic small vessel disease changes.  Associated ventriculomegaly, stable since prior study. No acute intracranial abnormality.  Specifically, no hemorrhage, hydrocephalus, mass lesion, acute infarction, or significant intracranial injury.  No acute calvarial abnormality. Visualized paranasal sinuses and mastoids clear.  Orbital soft tissues unremarkable.  IMPRESSION: No acute intracranial abnormality.  Atrophy, chronic microvascular disease.  Original Report Authenticated By: Cyndie Chime, M.D.   Results for orders placed during the  hospital encounter of 10/12/11  URINALYSIS, ROUTINE W REFLEX MICROSCOPIC      Component Value Range   Color, Urine YELLOW  YELLOW    APPearance CLEAR  CLEAR    Specific Gravity, Urine 1.015  1.005 - 1.030    pH 8.0  5.0 - 8.0    Glucose, UA NEGATIVE  NEGATIVE (mg/dL)   Hgb urine dipstick NEGATIVE  NEGATIVE    Bilirubin Urine NEGATIVE  NEGATIVE    Ketones, ur NEGATIVE  NEGATIVE (mg/dL)   Protein, ur NEGATIVE  NEGATIVE (mg/dL)   Urobilinogen, UA 0.2  0.0 - 1.0 (mg/dL)   Nitrite NEGATIVE  NEGATIVE    Leukocytes, UA NEGATIVE  NEGATIVE   CBC      Component Value Range   WBC 5.9  4.0 - 10.5 (K/uL)   RBC 4.09  3.87 - 5.11 (MIL/uL)   Hemoglobin 12.3  12.0 - 15.0 (g/dL)   HCT 21.3  08.6 - 57.8 (%)   MCV 89.0  78.0 - 100.0 (fL)   MCH 30.1  26.0 - 34.0 (pg)   MCHC 33.8  30.0 - 36.0 (g/dL)   RDW 46.9  62.9 -  15.5 (%)   Platelets 130 (*) 150 - 400 (K/uL)  COMPREHENSIVE METABOLIC PANEL      Component Value Range   Sodium 136  135 - 145 (mEq/L)   Potassium 3.7  3.5 - 5.1 (mEq/L)   Chloride 98  96 - 112 (mEq/L)   CO2 28  19 - 32 (mEq/L)   Glucose, Bld 104 (*) 70 - 99 (mg/dL)   BUN 14  6 - 23 (mg/dL)   Creatinine, Ser 1.61  0.50 - 1.10 (mg/dL)   Calcium 9.2  8.4 - 09.6 (mg/dL)   Total Protein 6.8  6.0 - 8.3 (g/dL)   Albumin 3.5  3.5 - 5.2 (g/dL)   AST 16  0 - 37 (U/L)   ALT 10  0 - 35 (U/L)   Alkaline Phosphatase 65  39 - 117 (U/L)   Total Bilirubin 0.3  0.3 - 1.2 (mg/dL)   GFR calc non Af Amer 90 (*) >90 (mL/min)   GFR calc Af Amer >90  >90 (mL/min)    Date: 10/12/2011  Rate: 82  Rhythm: normal sinus rhythm  QRS Axis: normal  Intervals: normal  ST/T Wave abnormalities: normal  Conduction Disutrbances:none  Narrative Interpretation:   Old EKG Reviewed: unchanged No sniffing change from 10/08/2009   1. Fall   2. Abdominal pain       MDM   Patient brought in by EMS from nursing home history of fall complaint of abdominal pain and vomiting. In the emergency Department no  vomiting no complaint of abdominal pain alert but not oriented. Workup negative. Head CT normal CT and x-rays of the hips are negative. Lab workup without any specific findings. Patient has a history of chronic abdominal pain.  Patient stable to return to nursing home and followup as needed.       Shelda Jakes, MD 10/12/11 1205  Shelda Jakes, MD 10/14/11 [phone]

## 2011-10-12 NOTE — ED Notes (Signed)
Call [facility] to transport patient to living facility

## 2011-10-12 NOTE — ED Notes (Signed)
[facility] - staff state, "pt. Found laying in vomiting and c/o abd. Pain." bile. Found by ems in same position, laying in vomit; incontinent. Full Code. Hx.: Chronic Abd. Pain.

## 2011-10-28 ENCOUNTER — Emergency Department (HOSPITAL_COMMUNITY): Payer: Medicare Other

## 2011-10-28 ENCOUNTER — Emergency Department (HOSPITAL_COMMUNITY)
Admission: EM | Admit: 2011-10-28 | Discharge: 2011-10-28 | Disposition: A | Discharge status: Other Acute Inpatient Hospital | Payer: Medicare Other | Attending: Emergency Medicine | Admitting: Emergency Medicine

## 2011-10-28 DIAGNOSIS — R51 Headache: Secondary | ICD-10-CM | POA: Insufficient documentation

## 2011-10-28 DIAGNOSIS — E785 Hyperlipidemia, unspecified: Secondary | ICD-10-CM | POA: Insufficient documentation

## 2011-10-28 DIAGNOSIS — F0390 Unspecified dementia without behavioral disturbance: Secondary | ICD-10-CM | POA: Insufficient documentation

## 2011-10-28 DIAGNOSIS — W19XXXA Unspecified fall, initial encounter: Secondary | ICD-10-CM | POA: Insufficient documentation

## 2011-10-28 DIAGNOSIS — M549 Dorsalgia, unspecified: Secondary | ICD-10-CM | POA: Insufficient documentation

## 2011-10-28 DIAGNOSIS — G319 Degenerative disease of nervous system, unspecified: Secondary | ICD-10-CM | POA: Insufficient documentation

## 2011-10-28 DIAGNOSIS — Y9210 Unspecified residential institution as the place of occurrence of the external cause: Secondary | ICD-10-CM | POA: Insufficient documentation

## 2011-10-28 DIAGNOSIS — M25559 Pain in unspecified hip: Secondary | ICD-10-CM | POA: Insufficient documentation

## 2011-10-28 HISTORY — DX: Other chronic pain: G89.29

## 2011-10-28 HISTORY — DX: Solitary plasmacytoma not having achieved remission: C90.30

## 2011-10-28 HISTORY — DX: Unspecified dementia, unspecified severity, without behavioral disturbance, psychotic disturbance, mood disturbance, and anxiety: F03.90

## 2011-10-28 HISTORY — DX: Unspecified osteoarthritis, unspecified site: M19.90

## 2011-10-28 HISTORY — DX: Hyperlipidemia, unspecified: E78.5

## 2011-10-28 HISTORY — DX: Other specified disorders of bone density and structure, unspecified site: M85.80

## 2011-10-28 HISTORY — DX: Epigastric pain: R10.13

## 2011-10-28 LAB — URINALYSIS, ROUTINE W REFLEX MICROSCOPIC
Glucose, UA: NEGATIVE mg/dL
Hgb urine dipstick: NEGATIVE
Protein, ur: NEGATIVE mg/dL
Urobilinogen, UA: 1 mg/dL (ref 0.0–1.0)

## 2011-10-28 LAB — BASIC METABOLIC PANEL
Calcium: 9.2 mg/dL (ref 8.4–10.5)
Creatinine, Ser: 0.63 mg/dL (ref 0.50–1.10)
GFR calc non Af Amer: 89 mL/min — ABNORMAL LOW (ref >90)
Glucose, Bld: 99 mg/dL (ref 70–99)
Sodium: 136 mEq/L (ref 135–145)

## 2011-10-28 LAB — CBC
HCT: 36 % (ref 36.0–46.0)
MCH: 29.9 pg (ref 26.0–34.0)
MCV: 88.2 fL (ref 78.0–100.0)
Platelets: 155 10*3/uL (ref 150–400)
RDW: 13.3 % (ref 11.5–15.5)

## 2011-10-28 LAB — DIFFERENTIAL
Basophils Absolute: 0 10*3/uL (ref 0.0–0.1)
Eosinophils Absolute: 0.1 10*3/uL (ref 0.0–0.7)
Eosinophils Relative: 1 % (ref 0–5)
Lymphs Abs: 0.8 10*3/uL (ref 0.7–4.0)
Monocytes Absolute: 0.6 10*3/uL (ref 0.1–1.0)

## 2011-10-28 LAB — URINE MICROSCOPIC-ADD ON

## 2011-10-28 NOTE — ED Provider Notes (Signed)
History     CSN: [id number]  Arrival date & time 10/28/11  0803   First MD Initiated Contact with Patient 10/28/11 [phone]      Chief Complaint  Patient presents with  . Fall    (Consider location/radiation/quality/duration/timing/severity/associated sxs/prior treatment) HPI The patient presents following a fall. Notably this is the patient's third presentation for this complaint within the past month. Per nursing home report the patient was found on the floor in her room, approximately 20 minutes after previously having been observed in her bed. The patient does not recall how she fell, and on exam has migratory complaints. She notes inconsistently, head, back, scalp pain. The patient has a notable history of dementia, and a level V caveat applies. Past Medical History  Diagnosis Date  . Dementia   . Osteoarthritis   . Epigastric pain   . Chronic pain   . Hyperlipidemia   . Osteopenia   . 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 No pertinent past surgical history.  History reviewed. No pertinent family history.  History  Substance Use Topics  . Smoking status: Unknown If Ever Smoked  . Smokeless tobacco: Not on file  . Alcohol Use:     OB History    Grav Para Term Preterm Abortions TAB SAB Ect Mult Living                  Review of Systems  Unable to perform ROS: Dementia    Allergies  Review of patient's allergies indicates no known allergies.  Home Medications   Current Outpatient Rx  Name Route Sig Dispense Refill  . ESCITALOPRAM OXALATE 10 MG PO TABS Oral Take 10 mg by mouth daily.      Marland Kitchen ALPRAZOLAM 0.25 MG PO TBDP Oral Take 0.25 mg by mouth 4 (four) times daily as needed. For anxiety/agitation     . CITALOPRAM HYDROBROMIDE 20 MG PO TABS Oral Take 20 mg by mouth daily.      Marland Kitchen DIVALPROEX SODIUM ER 250 MG PO TB24 Oral Take 250 mg by mouth daily.      . MELOXICAM 7.5 MG PO TABS Oral Take 7.5 mg by mouth 3 (three)  times daily with meals.      Marland Kitchen PERPHENAZINE 4 MG PO TABS Oral Take 4 mg by mouth 2 (two) times daily.      Marland Kitchen PERPHENAZINE 8 MG PO TABS Oral Take 8 mg by mouth at bedtime.        BP 155/71  Pulse 68  Temp(Src) 97.8 F (36.6 C) (Oral)  Resp 14  Ht 5\' 5"  (1.651 m)  Wt 122 lb (55.339 kg)  BMI 20.30 kg/m2  SpO2 98%  Physical Exam  Nursing note and vitals reviewed. Constitutional:       Elderly female presenting boarded and collared via EMS, in no distress.  She was taken from the board during my initial evaluation.  HENT:  Head: Normocephalic and atraumatic. Head is without raccoon's eyes, without Battle's sign, without abrasion, without contusion, without laceration and without right periorbital erythema.  Mouth/Throat: Oropharynx is clear and moist.  Eyes: Conjunctivae are normal. Pupils are equal, round, and reactive to light. No scleral icterus.  Neck: No tracheal deviation present.       Collar left in place  Cardiovascular: Normal rate and regular rhythm.   Murmur heard. Pulmonary/Chest: No stridor. No respiratory distress. She has no wheezes.  Abdominal: Soft. She exhibits  no distension. There is no tenderness.  Musculoskeletal: She exhibits no edema and no tenderness.  Neurological: She is alert. No cranial nerve deficit. She exhibits normal muscle tone. Coordination normal.  Skin: Skin is dry. No rash noted. No erythema.    ED Course  Procedures (including critical care time)   Labs Reviewed  CBC  DIFFERENTIAL  BASIC METABOLIC PANEL  URINALYSIS, ROUTINE W REFLEX MICROSCOPIC   No results found.   No diagnosis found.   Date: 10/28/2011  Rate: 64  Rhythm: normal sinus rhythm  QRS Axis: normal  Intervals: normal  ST/T Wave abnormalities: nonspecific ST/T changes  Conduction Disutrbances:none  Narrative Interpretation:   Old EKG Reviewed: unchanged ABNORMAL ECG  CT/ xr, all reviewed by me.  No acute changes    MDM  This elderly female presents from her  nursing home following an unwitnessed, possible fall. The patient is incapable of describing specifically what happened, and on initial evaluation he complains of intermittent neck pain. The patient's vital signs are unremarkable, her labs and radiographic studies do not demonstrate acute changes from her baseline. The patient's sons were present on reevaluation, and note that the patient is at baseline.  She will be discharged to return to her nursing home the sons will discuss fall prevention with nursing home staff.        Gerhard Munch, MD 10/28/11 1105

## 2011-10-28 NOTE — ED Notes (Signed)
Pt to go back to [facility] via [facility], report called to nurse pam at [facility].

## 2011-10-28 NOTE — ED Notes (Signed)
Pt brought in by ems [facility] s/[facility] fall, pt was found on the floor by staff, un witnessed, no obvious injuries or deformities noted

## 2011-10-28 NOTE — ED Notes (Signed)
JXB:[id number]<GN> Expected date:10/28/11<BR> Expected time: 7:51 AM<BR> Means of arrival:Ambulance<BR> Comments:<BR> Elderly/fall

## 2011-11-06 ENCOUNTER — Emergency Department (HOSPITAL_COMMUNITY): Payer: Medicare Other

## 2011-11-06 ENCOUNTER — Encounter (HOSPITAL_COMMUNITY): Payer: Self-pay | Admitting: Family Medicine

## 2011-11-06 ENCOUNTER — Emergency Department (HOSPITAL_COMMUNITY)
Admission: EM | Admit: 2011-11-06 | Discharge: 2011-11-06 | Disposition: A | Discharge status: Skilled Nursing Facility | Payer: Medicare Other | Attending: Emergency Medicine | Admitting: Emergency Medicine

## 2011-11-06 DIAGNOSIS — R4182 Altered mental status, unspecified: Secondary | ICD-10-CM | POA: Insufficient documentation

## 2011-11-06 DIAGNOSIS — F0390 Unspecified dementia without behavioral disturbance: Secondary | ICD-10-CM | POA: Insufficient documentation

## 2011-11-06 DIAGNOSIS — R079 Chest pain, unspecified: Secondary | ICD-10-CM | POA: Insufficient documentation

## 2011-11-06 DIAGNOSIS — Z79899 Other long term (current) drug therapy: Secondary | ICD-10-CM | POA: Insufficient documentation

## 2011-11-06 DIAGNOSIS — N390 Urinary tract infection, site not specified: Secondary | ICD-10-CM | POA: Insufficient documentation

## 2011-11-06 DIAGNOSIS — M1990 Unspecified osteoarthritis, unspecified site: Secondary | ICD-10-CM | POA: Insufficient documentation

## 2011-11-06 DIAGNOSIS — E785 Hyperlipidemia, unspecified: Secondary | ICD-10-CM | POA: Insufficient documentation

## 2011-11-06 LAB — CBC
HCT: 38.8 % (ref 36.0–46.0)
Hemoglobin: 13.2 g/dL (ref 12.0–15.0)
MCV: 88.4 fL (ref 78.0–100.0)
RBC: 4.39 MIL/uL (ref 3.87–5.11)
RDW: 13.4 % (ref 11.5–15.5)
WBC: 6.2 10*3/uL (ref 4.0–10.5)

## 2011-11-06 LAB — BASIC METABOLIC PANEL
CO2: 30 mEq/L (ref 19–32)
Calcium: 9.4 mg/dL (ref 8.4–10.5)
Chloride: 99 mEq/L (ref 96–112)
Creatinine, Ser: 0.72 mg/dL (ref 0.50–1.10)
Glucose, Bld: 145 mg/dL — ABNORMAL HIGH (ref 70–99)

## 2011-11-06 LAB — URINALYSIS, ROUTINE W REFLEX MICROSCOPIC
Bilirubin Urine: NEGATIVE
Glucose, UA: NEGATIVE mg/dL
Ketones, ur: 15 mg/dL — AB
Nitrite: POSITIVE — AB
Specific Gravity, Urine: 1.018 (ref 1.005–1.030)
pH: 6 (ref 5.0–8.0)

## 2011-11-06 LAB — DIFFERENTIAL
Basophils Absolute: 0 10*3/uL (ref 0.0–0.1)
Eosinophils Relative: 0 % (ref 0–5)
Lymphocytes Relative: 9 % — ABNORMAL LOW (ref 12–46)
Lymphs Abs: 0.6 10*3/uL — ABNORMAL LOW (ref 0.7–4.0)
Monocytes Absolute: 0.4 10*3/uL (ref 0.1–1.0)
Monocytes Relative: 7 % (ref 3–12)
Neutro Abs: 5.2 10*3/uL (ref 1.7–7.7)

## 2011-11-06 LAB — URINE MICROSCOPIC-ADD ON

## 2011-11-06 LAB — VALPROIC ACID LEVEL: Valproic Acid Lvl: 51.2 ug/mL (ref 50.0–100.0)

## 2011-11-06 MED ORDER — DEXTROSE 5 % IV SOLN
1.0000 g | INTRAVENOUS | Status: DC
  Administered 2011-11-06: 1 g via INTRAVENOUS
  Filled 2011-11-06: qty 10

## 2011-11-06 MED ORDER — CEFUROXIME AXETIL 250 MG PO TABS: 250.0000 mg | ORAL_TABLET | Freq: Two times a day (BID) | ORAL | Status: DC

## 2011-11-06 NOTE — ED Notes (Signed)
Pt was given soda and attempted to eat sandwich, [facility] called for transport and picked pt up. Report called to [facility] RN and told pt did not eat much here at ED. Aware pt is returning to facility. Pt son aware or pt returning to facitlity as well

## 2011-11-06 NOTE — ED Provider Notes (Signed)
History     CSN: [id number]  Arrival date & time 11/06/11  1555   First MD Initiated Contact with Patient 11/06/11 1604      Chief Complaint  Patient presents with  . Altered Mental Status    (Consider location/radiation/quality/duration/timing/severity/associated sxs/prior treatment) HPI Patient presents the emergency room with complaints of altered mental status. Patient is a resident of a nursing facility. She does have history of dementia. The nursing home indicated that she was not as alert as she usually is. Patient usually is more active and talkative. They found her sleeping in a chair. Here in the emergency room the patient is unable to tell me while she is here. She will intermittently respond and follow my commands. However, once her son arrived and started talking to her the patient began answering all of his questions. She does note that she's in the hospital and she was able to recognize her name her son. She denies any complaints. Past Medical History  Diagnosis Date  . Dementia   . Osteoarthritis   . Epigastric pain   . Chronic pain   . Hyperlipidemia   . Osteopenia   . 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 No pertinent past surgical history.  History reviewed. No pertinent family history.  History  Substance Use Topics  . Smoking status: Unknown If Ever Smoked  . Smokeless tobacco: Not on file  . Alcohol Use: No    OB History    Grav Para Term Preterm Abortions TAB SAB Ect Mult Living                  Review of Systems  All other systems reviewed and are negative.    Allergies  Review of patient's allergies indicates no known allergies.  Home Medications   Current Outpatient Rx  Name Route Sig Dispense Refill  . ALPRAZOLAM 0.25 MG PO TABS Oral Take 0.25 mg by mouth 4 (four) times daily as needed. For anxiety/agitation.    Marland Kitchen DIVALPROEX SODIUM ER 250 MG PO TB24 Oral Take 250 mg by mouth 2 (two)  times daily.     Marland Kitchen ESCITALOPRAM OXALATE 20 MG PO TABS Oral Take 20 mg by mouth every morning.     Marland Kitchen MELOXICAM 7.5 MG PO TABS Oral Take 7.5 mg by mouth 3 (three) times daily with meals.      Marland Kitchen PERPHENAZINE 4 MG PO TABS Oral Take 4 mg by mouth 2 (two) times daily.      Marland Kitchen PERPHENAZINE 8 MG PO TABS Oral Take 8 mg by mouth at bedtime.        BP 94/45  Pulse 87  Temp(Src) 97.7 F (36.5 C) (Oral)  Resp 20  SpO2 98%  Physical Exam  Nursing note and vitals reviewed. Constitutional: No distress.       Frail  HENT:  Head: Normocephalic and atraumatic.  Right Ear: External ear normal.  Left Ear: External ear normal.  Eyes: Conjunctivae are normal. Right eye exhibits no discharge. Left eye exhibits no discharge. No scleral icterus.  Neck: Neck supple. No tracheal deviation present.  Cardiovascular: Normal rate, regular rhythm and intact distal pulses.   Pulmonary/Chest: Effort normal and breath sounds normal. No stridor. No respiratory distress. She has no wheezes. She has no rales.  Abdominal: Soft. Bowel sounds are normal. She exhibits no distension. There is no tenderness. There is no rebound and no guarding.  Musculoskeletal: She exhibits  no edema and no tenderness.  Neurological: She is alert. She has normal strength. She is disoriented. She displays no tremor. No cranial nerve deficit ( no gross defecits noted) or sensory deficit. She exhibits normal muscle tone. She displays no seizure activity. Coordination normal.       Oriented to person and place, she is able to recognize her son, patient does not comply with neurologic exam to do a complete evaluation  Skin: Skin is warm and dry. No rash noted.  Psychiatric: She has a normal mood and affect.    ED Course  Procedures (including critical care time)  Labs Reviewed  DIFFERENTIAL - Abnormal; Notable for the following:    Neutrophils Relative 84 (*)    Lymphocytes Relative 9 (*)    Lymphs Abs 0.6 (*)    All other components within  normal limits  BASIC METABOLIC PANEL - Abnormal; Notable for the following:    Glucose, Bld 145 (*)    GFR calc non Af Amer 85 (*)    All other components within normal limits  URINALYSIS, ROUTINE W REFLEX MICROSCOPIC - Abnormal; Notable for the following:    APPearance TURBID (*)    Ketones, ur 15 (*)    Nitrite POSITIVE (*)    Leukocytes, UA LARGE (*)    All other components within normal limits  URINE MICROSCOPIC-ADD ON - Abnormal; Notable for the following:    Squamous Epithelial / LPF MANY (*)    Bacteria, UA MANY (*)    All other components within normal limits  CBC  VALPROIC ACID LEVEL   Ct Head Wo Contrast  11/06/2011  *RADIOLOGY REPORT*  Clinical Data: 71 year old female with altered mental status.  CT HEAD WITHOUT CONTRAST  Technique:  Contiguous axial images were obtained from the base of the skull through the vertex without contrast.  Comparison: 10/28/2011  Findings: Mild generalized cerebral volume loss and mild to moderate chronic small vessel white matter ischemic changes are again identified.  No acute intracranial abnormalities are identified, including mass lesion or mass effect, hydrocephalus, extra-axial fluid collection, midline shift, hemorrhage, or acute infarction.  The visualized bony calvarium is unremarkable. Mucosal thickening within the left maxillary sinus is again noted.  IMPRESSION: No evidence of acute intracranial abnormality.  Atrophy and chronic small vessel white matter ischemic changes.  Original Report Authenticated By: Rosendo Gros, M.D.   Dg Chest Port 1 View  11/06/2011  *RADIOLOGY REPORT*  Clinical Data: Chest pain, dementia  PORTABLE CHEST - 1 VIEW  Comparison: August 06, 2011  Findings: The cardiac silhouette, mediastinum, pulmonary vasculature are within normal limits.  Both lungs are clear. There is no acute bony abnormality.  IMPRESSION: There is no evidence of acute cardiac or pulmonary process.  Original Report Authenticated By: Brandon Melnick, M.D.   Diagnosis: UTI   MDM  Patient appears of urinary tract infection based on her urinalysis. Her laboratory tests are otherwise unremarkable. She does not appear to be severely dehydrated. Where x-rays did not show any signs of acute stroke or pneumonia. According to her son she seems to be at her baseline. He states she has her good and bad days. At this point I feel it is reasonable to discharge her back to her nursing facility with a prescription for antibiotics for a urinary tract infection.        Celene Kras, MD 11/06/11 [phone]

## 2011-11-06 NOTE — ED Notes (Signed)
Pt is from [facility] nursing facility off [address]

## 2011-11-06 NOTE — ED Notes (Signed)
Patient transported to CT 

## 2011-11-06 NOTE — ED Notes (Signed)
Return from CT scan.

## 2011-11-06 NOTE — ED Notes (Signed)
Per EMS, pt recently in the hospital and treated for flu. Pt more altered than normal. sts usually talking and today was slumped over in her chair. Pt alert but not talkative. CBG 133. NSR. 20 LFA

## 2011-11-06 NOTE — ED Notes (Signed)
Pt undressed, in gown, on monitor, continuous pulse oximetry and blood pressure cuff 

## 2011-11-07 ENCOUNTER — Emergency Department (HOSPITAL_COMMUNITY)
Admission: EM | Admit: 2011-11-07 | Discharge: 2011-11-07 | Disposition: A | Discharge status: Home / Self Care | Payer: Medicare Other | Attending: Emergency Medicine | Admitting: Emergency Medicine

## 2011-11-07 ENCOUNTER — Other Ambulatory Visit: Payer: Self-pay

## 2011-11-07 ENCOUNTER — Emergency Department (HOSPITAL_COMMUNITY): Payer: Medicare Other

## 2011-11-07 ENCOUNTER — Encounter (HOSPITAL_COMMUNITY): Payer: Self-pay | Admitting: Emergency Medicine

## 2011-11-07 DIAGNOSIS — N390 Urinary tract infection, site not specified: Secondary | ICD-10-CM | POA: Insufficient documentation

## 2011-11-07 DIAGNOSIS — W050XXA Fall from non-moving wheelchair, initial encounter: Secondary | ICD-10-CM | POA: Insufficient documentation

## 2011-11-07 DIAGNOSIS — M79609 Pain in unspecified limb: Secondary | ICD-10-CM | POA: Insufficient documentation

## 2011-11-07 DIAGNOSIS — M25559 Pain in unspecified hip: Secondary | ICD-10-CM | POA: Insufficient documentation

## 2011-11-07 DIAGNOSIS — IMO0002 Reserved for concepts with insufficient information to code with codable children: Secondary | ICD-10-CM | POA: Insufficient documentation

## 2011-11-07 DIAGNOSIS — F0390 Unspecified dementia without behavioral disturbance: Secondary | ICD-10-CM | POA: Insufficient documentation

## 2011-11-07 LAB — BASIC METABOLIC PANEL
BUN: 22 mg/dL (ref 6–23)
Calcium: 9.4 mg/dL (ref 8.4–10.5)
Creatinine, Ser: 0.65 mg/dL (ref 0.50–1.10)
GFR calc Af Amer: >90 mL/min (ref >90)
GFR calc non Af Amer: 88 mL/min — ABNORMAL LOW (ref >90)

## 2011-11-07 LAB — DIFFERENTIAL
Basophils Relative: 0 % (ref 0–1)
Eosinophils Absolute: 0 10*3/uL (ref 0.0–0.7)
Eosinophils Relative: 0 % (ref 0–5)
Monocytes Absolute: 0.8 10*3/uL (ref 0.1–1.0)
Monocytes Relative: 13 % — ABNORMAL HIGH (ref 3–12)

## 2011-11-07 LAB — URINE CULTURE
Colony Count: >=100000
Culture  Setup Time: 201301181413

## 2011-11-07 LAB — CBC
HCT: 38.6 % (ref 36.0–46.0)
Hemoglobin: 13.2 g/dL (ref 12.0–15.0)
MCH: 30.1 pg (ref 26.0–34.0)
MCHC: 34.2 g/dL (ref 30.0–36.0)

## 2011-11-07 NOTE — ED Notes (Signed)
[facility] notified to transport pt back to SNF

## 2011-11-07 NOTE — ED Provider Notes (Addendum)
History     CSN: [id number]  Arrival date & time 11/07/11  1454   First MD Initiated Contact with Patient 11/07/11 1512      Chief Complaint  Patient presents with  . Leg Pain    Pt found on the floor by her w/c and now she is c/o ilat leg pain Pt is from assitted living [place].    (Consider location/radiation/quality/duration/timing/severity/associated sxs/prior treatment) HPI Comments: This is the patient's third visit this month, today for evaluation of leg pain after a presumed fall from wheelchair. The patient was seen for similar presentation approximately one to 2 weeks ago.  She was seen and evaluated yesterday for mental status changes including with a head CT scan, chest x-ray, and blood work as well as urinalysis, which showed no acute intracranial process, no acute cardiopulmonary process, but an acute urinary tract infection for which she is being treated. Today she was found at her memory care nursing home on the floor next to her wheelchair without any history as to how she got there. This was presumed to be a fall by nursing home staff. The patient reportedly complained of leg pain to nursing home staff, however on arrival to the ED, she is not speaking or following commands but is fighting exam. She voices no complaints of pain and is moving all extremities without any apparent pain. She has no tenderness or deformity of the upper or lower extremities on examination, no sign of head trauma, and no back or neck pain. A level V caveat applies to this encounter do to dementia and the patient's inability to give a history or review of systems, as well as her inability to participate or cooperate with physical examination. The patient was found on the floor next to her wheelchair just prior to being sent to the ED for evaluation.  Patient is a 71 y.o. female presenting with leg pain.  Leg Pain     Past Medical History  Diagnosis Date  . Dementia   . Osteoarthritis   .  Epigastric pain   . Chronic pain   . Hyperlipidemia   . Osteopenia   . 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 No pertinent past surgical history.  History reviewed. No pertinent family history.  History  Substance Use Topics  . Smoking status: Unknown If Ever Smoked  . Smokeless tobacco: Not on file  . Alcohol Use: No    OB History    Grav Para Term Preterm Abortions TAB SAB Ect Mult Living                  Review of Systems  Unable to perform ROS: Dementia    Allergies  Review of patient's allergies indicates no known allergies.  Home Medications   Current Outpatient Rx  Name Route Sig Dispense Refill  . ALPRAZOLAM 0.25 MG PO TABS Oral Take 0.25 mg by mouth 4 (four) times daily as needed. For anxiety/agitation.    Marland Kitchen DIVALPROEX SODIUM ER 250 MG PO TB24 Oral Take 250 mg by mouth 2 (two) times daily.     Marland Kitchen ESCITALOPRAM OXALATE 20 MG PO TABS Oral Take 20 mg by mouth every morning.     Marland Kitchen MELOXICAM 7.5 MG PO TABS Oral Take 7.5 mg by mouth 3 (three) times daily with meals.      Marland Kitchen PERPHENAZINE 4 MG PO TABS Oral Take 4 mg by mouth 2 (two) times daily.      Marland Kitchen  PERPHENAZINE 8 MG PO TABS Oral Take 8 mg by mouth at bedtime.        BP 118/72  Pulse 78  Temp(Src) 97.9 F (36.6 C) (Oral)  Resp 20  SpO2 97%  Physical Exam  Nursing note and vitals reviewed. Constitutional: She appears well-nourished. No distress.       The patient appears in no significant discomfort, awake, alert, and oriented to person only. She denies any pain or injury. There are no apparent injuries on examination.  HENT:  Head: Normocephalic and atraumatic. Head is without raccoon's eyes, without Battle's sign, without abrasion, without contusion, without laceration, without right periorbital erythema and without left periorbital erythema. No trismus in the jaw.  Right Ear: Tympanic membrane, external ear and ear canal normal. No drainage or tenderness. No  mastoid tenderness. Tympanic membrane is not perforated. No hemotympanum.  Left Ear: Tympanic membrane, external ear and ear canal normal. No drainage or tenderness. No mastoid tenderness. Tympanic membrane is not perforated. No hemotympanum.  Nose: Nose normal. No mucosal edema, rhinorrhea, nose lacerations, sinus tenderness, nasal deformity, septal deviation or nasal septal hematoma. No epistaxis. Right sinus exhibits no maxillary sinus tenderness and no frontal sinus tenderness. Left sinus exhibits no maxillary sinus tenderness and no frontal sinus tenderness.  Mouth/Throat: Uvula is midline, oropharynx is clear and moist and mucous membranes are normal. No lacerations.  Eyes: Conjunctivae, EOM and lids are normal. Pupils are equal, round, and reactive to light. Right eye exhibits no chemosis and no discharge. Left eye exhibits no chemosis and no discharge. Right conjunctiva is not injected. Right conjunctiva has no hemorrhage. Left conjunctiva is not injected. Left conjunctiva has no hemorrhage. Right eye exhibits normal extraocular motion. Left eye exhibits normal extraocular motion.  Neck: Trachea normal, normal range of motion and phonation normal. Neck supple. No JVD present. No tracheal tenderness, no spinous process tenderness and no muscular tenderness present. No tracheal deviation present.       Immobilized in C collar which was removed by me, followed by more detailed examination of the cervical spine which showed no deformity or tenderness to the posterior cervical vertebrae, no impairment of the active range of motion on behalf of the patient, and no pain with rotation to the left, the right, flexion of the neck, or extension of the head and neck. The cervical collar was therefore cleared by me.  Cardiovascular: Normal rate, regular rhythm, S1 normal, S2 normal, normal heart sounds and intact distal pulses.  Exam reveals no gallop, no distant heart sounds and no friction rub.   No murmur  heard. Pulmonary/Chest: Effort normal and breath sounds normal. No accessory muscle usage or stridor. Not tachypneic. No respiratory distress. She has no decreased breath sounds. She has no wheezes. She has no rales. She exhibits no tenderness, no bony tenderness, no crepitus, no deformity and no retraction.  Abdominal: Soft. Normal appearance and bowel sounds are normal. She exhibits no distension, no pulsatile midline mass and no mass. There is no splenomegaly or hepatomegaly. There is no tenderness. There is no rigidity, no rebound, no guarding and no CVA tenderness.  Musculoskeletal: Normal range of motion. She exhibits no edema and no tenderness.       Right shoulder: Normal.       Left shoulder: Normal.       Right elbow: Normal.      Left elbow: Normal.       Right wrist: Normal.       Left wrist: Normal.  Right hip: She exhibits normal range of motion, normal strength, no tenderness, no bony tenderness, no crepitus and no deformity.       Left hip: She exhibits normal range of motion, normal strength, no tenderness, no bony tenderness, no crepitus and no deformity.       Right knee: Normal.       Left knee: Normal.       Right ankle: Normal.       Left ankle: Normal.       Cervical back: She exhibits no tenderness, no bony tenderness, no swelling, no deformity and no pain.       Thoracic back: She exhibits no tenderness, no bony tenderness, no deformity and no pain.       Lumbar back: She exhibits no tenderness, no bony tenderness, no deformity and no pain.  Neurological: She is disoriented. No cranial nerve deficit or sensory deficit. She exhibits normal muscle tone. GCS eye subscore is 4. GCS verbal subscore is 3. GCS motor subscore is 5.       No focal neurologic deficits, however the patient is unable to participate with the examination which limits the neurologic exam.  Skin: Skin is warm and dry. Abrasion and bruising noted. No rash noted. She is not diaphoretic. No erythema.  No pallor.  Psychiatric: She has a normal mood and affect. Her speech is normal and behavior is normal.    ED Course  Procedures (including critical care time)   Date: 11/07/2011  Rate: 71  Rhythm: normal sinus rhythm  QRS Axis: normal  Intervals: normal  ST/T Wave abnormalities: nonspecific ST/T changes  Conduction Disutrbances:none  Narrative Interpretation: No significant changes from prior EKG, non-provocative EKG  Old EKG Reviewed: unchanged    Labs Reviewed  DIFFERENTIAL - Abnormal; Notable for the following:    Monocytes Relative 13 (*)    All other components within normal limits  BASIC METABOLIC PANEL - Abnormal; Notable for the following:    Glucose, Bld 119 (*)    GFR calc non Af Amer 88 (*)    All other components within normal limits  CBC  TROPONIN I   Dg Hip Bilateral W/pelvis  11/07/2011  *RADIOLOGY REPORT*  Clinical Data: Recent fall,  leg and hip pain  BILATERAL HIP WITH PELVIS - 4+ VIEW  Comparison: Pelvis film of 10/28/2011  Findings: There are mild degenerative changes in the hips.  The bones appear osteopenic.  No hip fracture is seen.  The pelvic rami are intact.  The SI joints appear normal.  There are degenerative changes in the lower lumbar spine.  IMPRESSION: Degenerative change and osteopenia.  No acute fracture.  Original Report Authenticated By: Juline Patch, M.D.   Ct Head Wo Contrast  11/06/2011  *RADIOLOGY REPORT*  Clinical Data: 71 year old female with altered mental status.  CT HEAD WITHOUT CONTRAST  Technique:  Contiguous axial images were obtained from the base of the skull through the vertex without contrast.  Comparison: 10/28/2011  Findings: Mild generalized cerebral volume loss and mild to moderate chronic small vessel white matter ischemic changes are again identified.  No acute intracranial abnormalities are identified, including mass lesion or mass effect, hydrocephalus, extra-axial fluid collection, midline shift, hemorrhage, or acute  infarction.  The visualized bony calvarium is unremarkable. Mucosal thickening within the left maxillary sinus is again noted.  IMPRESSION: No evidence of acute intracranial abnormality.  Atrophy and chronic small vessel white matter ischemic changes.  Original Report Authenticated By: Rosendo Gros, M.D.  Dg Chest Port 1 View  11/06/2011  *RADIOLOGY REPORT*  Clinical Data: Chest pain, dementia  PORTABLE CHEST - 1 VIEW  Comparison: August 06, 2011  Findings: The cardiac silhouette, mediastinum, pulmonary vasculature are within normal limits.  Both lungs are clear. There is no acute bony abnormality.  IMPRESSION: There is no evidence of acute cardiac or pulmonary process.  Original Report Authenticated By: Brandon Melnick, M.D.     No diagnosis found.    MDM  After reviewing the previous ED presentations, the patient had presented similarly, later to be confirmed to be at neurologic baseline by her sons who arrived. She does have advanced dementia and a urinary tract infection and I believe that this is the cause of any change in mental status. She has no evidence of cranial trauma to suggest the need for a repeat head CT, as that was just performed yesterday. In the interest of sparing the patient from excessive radiation, a head CT does not appear to be needed today in light of her examination. As the patient had reported leg pain to the nursing home staff, but there is no apparent deformity or tenderness of the lower extremities on examination, I will repeat a bilateral hip and pelvis x-ray to assure no fractures. I will assess a basic metabolic panel, CBC, troponin, and EKG to screen for any new metabolic or cardiovascular problems that would precipitate a fall or mental status change. I did contact the nursing facility to determine that it is indeed a skilled nursing facility and memory care unit where the patient gets around the clock care and observation, and if no abnormalities are found, the  patient appears that she would be stable for discharge back to the skilled nursing facility.  4:46 PM No apparent traumatic injury, no apparent significant metabolic process other than known urinary tract infection. The patient will be sent back home to her skilled nursing facility.      Felisa Bonier, MD 11/07/11 1544  Felisa Bonier, MD 11/07/11 1610  Felisa Bonier, MD 11/07/11 [phone]

## 2011-11-07 NOTE — ED Notes (Addendum)
Per EMS pt from AL facility [facility], found on floor beside wheelchair, pt was c/o bilateral leg pain.  Pt has had several recent trips to ER.  Pt refusing to talk at this time, VS stable.

## 2011-11-07 NOTE — ED Notes (Signed)
WUJ:[id number]<JY> Expected date:11/07/11<BR> Expected time: 1:57 PM<BR> Means of arrival:Ambulance<BR> Comments:<BR> EMS 10 GC - fall/shoulder pain

## 2011-11-10 NOTE — ED Notes (Signed)
Results received from [facility]. URNC, >/= 100,000 -> E Coli. Ceftin Rx given in ED for  -> sensitive to the same.  Chart appended per protocol.

## 2011-11-27 ENCOUNTER — Other Ambulatory Visit: Payer: Self-pay

## 2011-11-27 ENCOUNTER — Emergency Department (HOSPITAL_COMMUNITY)
Admission: EM | Admit: 2011-11-27 | Discharge: 2011-11-27 | Disposition: A | Discharge status: Other Acute Inpatient Hospital | Payer: Medicare Other | Attending: Emergency Medicine | Admitting: Emergency Medicine

## 2011-11-27 ENCOUNTER — Emergency Department (HOSPITAL_COMMUNITY): Payer: Medicare Other

## 2011-11-27 ENCOUNTER — Encounter (HOSPITAL_COMMUNITY): Payer: Self-pay | Admitting: Emergency Medicine

## 2011-11-27 DIAGNOSIS — M25529 Pain in unspecified elbow: Secondary | ICD-10-CM | POA: Insufficient documentation

## 2011-11-27 DIAGNOSIS — F0390 Unspecified dementia without behavioral disturbance: Secondary | ICD-10-CM | POA: Insufficient documentation

## 2011-11-27 DIAGNOSIS — M1990 Unspecified osteoarthritis, unspecified site: Secondary | ICD-10-CM | POA: Insufficient documentation

## 2011-11-27 DIAGNOSIS — G8929 Other chronic pain: Secondary | ICD-10-CM | POA: Insufficient documentation

## 2011-11-27 DIAGNOSIS — W19XXXA Unspecified fall, initial encounter: Secondary | ICD-10-CM | POA: Insufficient documentation

## 2011-11-27 DIAGNOSIS — R404 Transient alteration of awareness: Secondary | ICD-10-CM | POA: Insufficient documentation

## 2011-11-27 DIAGNOSIS — E785 Hyperlipidemia, unspecified: Secondary | ICD-10-CM | POA: Insufficient documentation

## 2011-11-27 DIAGNOSIS — R51 Headache: Secondary | ICD-10-CM | POA: Insufficient documentation

## 2011-11-27 DIAGNOSIS — Z79899 Other long term (current) drug therapy: Secondary | ICD-10-CM | POA: Insufficient documentation

## 2011-11-27 LAB — URINALYSIS, ROUTINE W REFLEX MICROSCOPIC
Glucose, UA: NEGATIVE mg/dL
Hgb urine dipstick: NEGATIVE
Ketones, ur: NEGATIVE mg/dL
Leukocytes, UA: NEGATIVE
Nitrite: NEGATIVE
Protein, ur: NEGATIVE mg/dL
Specific Gravity, Urine: 1.026 (ref 1.005–1.030)
Urobilinogen, UA: 0.2 mg/dL (ref 0.0–1.0)
pH: 5.5 (ref 5.0–8.0)

## 2011-11-27 LAB — TROPONIN I: Troponin I: <0.3 ng/mL

## 2011-11-27 LAB — BASIC METABOLIC PANEL WITH GFR
BUN: 19 mg/dL (ref 6–23)
CO2: 28 meq/L (ref 19–32)
Calcium: 9.8 mg/dL (ref 8.4–10.5)
Chloride: 102 meq/L (ref 96–112)
Creatinine, Ser: 0.63 mg/dL (ref 0.50–1.10)
GFR calc Af Amer: >90 mL/min
GFR calc non Af Amer: 89 mL/min — ABNORMAL LOW
Glucose, Bld: 106 mg/dL — ABNORMAL HIGH (ref 70–99)
Potassium: 4.1 meq/L (ref 3.5–5.1)
Sodium: 140 meq/L (ref 135–145)

## 2011-11-27 LAB — CBC
HCT: 37.6 % (ref 36.0–46.0)
Hemoglobin: 12.9 g/dL (ref 12.0–15.0)
MCH: 30.5 pg (ref 26.0–34.0)
MCHC: 34.3 g/dL (ref 30.0–36.0)
MCV: 88.9 fL (ref 78.0–100.0)
Platelets: 200 10*3/uL (ref 150–400)
RBC: 4.23 MIL/uL (ref 3.87–5.11)
RDW: 13.4 % (ref 11.5–15.5)
WBC: 5.8 10*3/uL (ref 4.0–10.5)

## 2011-11-27 LAB — DIFFERENTIAL
Basophils Absolute: 0 10*3/uL (ref 0.0–0.1)
Basophils Relative: 0 % (ref 0–1)
Eosinophils Absolute: 0.1 10*3/uL (ref 0.0–0.7)
Eosinophils Relative: 1 % (ref 0–5)
Lymphocytes Relative: 26 % (ref 12–46)
Lymphs Abs: 1.5 10*3/uL (ref 0.7–4.0)
Monocytes Absolute: 0.7 10*3/uL (ref 0.1–1.0)
Monocytes Relative: 11 % (ref 3–12)
Neutro Abs: 3.6 10*3/uL (ref 1.7–7.7)
Neutrophils Relative %: 62 % (ref 43–77)

## 2011-11-27 NOTE — ED Provider Notes (Signed)
History     CSN: [id number]  Arrival date & time 11/27/11  Ernestina Columbia   First MD Initiated Contact with Patient 11/27/11 1934      Chief Complaint  Patient presents with  . Fall    (Consider location/radiation/quality/duration/timing/severity/associated sxs/prior treatment) HPI Comments: Demented patient from nursing home presenting after presumed fall. She was found on the floor in the lobby and is unable to tell how she got there. He is complaining of a headache and some right elbow pain. She is unable to provide any history regarding the fall. She is able to state her name birth date but thinks the president is [employer]. She denies any chest pain, abdominal pain, back or neck pain. No dizziness, lightheadedness nausea or vomiting. She has no obvious injuries.  He was seen in the ED multiple times in January after falls as well.  The history is provided by the patient and the EMS personnel.    Past Medical History  Diagnosis Date  . Dementia   . Osteoarthritis   . Epigastric pain   . Chronic pain   . Hyperlipidemia   . Osteopenia   . 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 No pertinent past surgical history.  No family history on file.  History  Substance Use Topics  . Smoking status: Unknown If Ever Smoked  . Smokeless tobacco: Not on file  . Alcohol Use: No    OB History    Grav Para Term Preterm Abortions TAB SAB Ect Mult Living                  Review of Systems  Unable to perform ROS: Dementia    Allergies  Review of patient's allergies indicates no known allergies.  Home Medications   Current Outpatient Rx  Name Route Sig Dispense Refill  . MELOXICAM 7.5 MG PO TABS Oral Take 7.5 mg by mouth daily.     Marland Kitchen PERPHENAZINE 4 MG PO TABS Oral Take 4 mg by mouth 2 (two) times daily.      Marland Kitchen PERPHENAZINE 8 MG PO TABS Oral Take 8 mg by mouth at bedtime.      . SERTRALINE HCL 25 MG PO TABS Oral Take 25 mg by mouth  daily.      BP 130/98  Pulse 80  Temp(Src) 98.2 F (36.8 C) (Oral)  Resp 18  SpO2 99%  Physical Exam  Constitutional: She appears well-developed and well-nourished. No distress.  HENT:  Head: Normocephalic and atraumatic.  Mouth/Throat: Oropharynx is clear and moist.  Eyes: Conjunctivae are normal. Pupils are equal, round, and reactive to light.  Neck: Normal range of motion. Neck supple.       No C-spine pain, step-off or deformity  Cardiovascular: Normal rate, regular rhythm and normal heart sounds.   No murmur heard. Pulmonary/Chest: Effort normal and breath sounds normal. No respiratory distress.  Abdominal: Soft. There is no tenderness. There is no rebound and no guarding.  Musculoskeletal: Normal range of motion. She exhibits no edema and no tenderness.  Neurological: She is alert. No cranial nerve deficit.       Cranial nerves II through XII intact, 5 out of 5 strength throughout no apparent deficits the patient less than cooperative with exam  Skin: Skin is warm.    ED Course  Procedures (including critical care time)  Labs Reviewed  BASIC METABOLIC PANEL - Abnormal; Notable for the following:  Glucose, Bld 106 (*)    GFR calc non Af Amer 89 (*)    All other components within normal limits  URINALYSIS, ROUTINE W REFLEX MICROSCOPIC - Abnormal; Notable for the following:    Color, Urine AMBER (*) BIOCHEMICALS MAY BE AFFECTED BY COLOR   Bilirubin Urine SMALL (*)    All other components within normal limits  CBC  DIFFERENTIAL  TROPONIN I  LAB REPORT - SCANNED   Dg Chest 1 View  11/27/2011  *RADIOLOGY REPORT*  Clinical Data: 71 year old female status post fall.  Altered level of consciousness.  CHEST - 1 VIEW  Comparison: 11/06/2011 and earlier.  Findings: Stable, normal lung volumes.  Cardiac size and mediastinal contours are within normal limits.  Visualized tracheal air column is within normal limits.  Stable mild increased interstitial markings.  No pneumothorax,  pulmonary edema, pleural effusion or acute pulmonary opacity.  Osteopenia.  Chronic left clavicle deformity partially visible.  IMPRESSION: No acute cardiopulmonary abnormality.  Original Report Authenticated By: Harley Hallmark, M.D.   Dg Pelvis 1-2 Views  11/27/2011  *RADIOLOGY REPORT*  Clinical Data: 71 year old female status post fall.  Altered level of consciousness, confusion.  PELVIS - 1-2 VIEW  Comparison: 11/07/2011.  Findings: Femoral heads normally located.  Proximal femurs appears stable.  Increased large bowel gas, but gas is present in the distal colon.  No dilated small bowel identified.  IVC filter partially visible.  Chronic right lower quadrant calcifications. Osteopenia of the pelvis.  No acute pelvic fracture identified.  IMPRESSION: No acute fracture or dislocation identified about the pelvis.  Original Report Authenticated By: Harley Hallmark, M.D.   Dg Elbow 2 Views Right  11/27/2011  *RADIOLOGY REPORT*  Clinical Data: 71 year old female status post fall.  RIGHT ELBOW - 2 VIEW  Comparison: None.  Findings: Two views of the elbow.  No evidence of joint effusion. Bone mineralization is within normal limits for age.  Joint spaces and alignment preserved.  No fracture identified.  IMPRESSION: No acute fracture or dislocation identified about the elbow.  Original Report Authenticated By: Harley Hallmark, M.D.   Ct Head Wo Contrast  11/27/2011  *RADIOLOGY REPORT*  Clinical Data:  71 year old female found down.  Pain.  CT HEAD WITHOUT CONTRAST CT CERVICAL SPINE WITHOUT CONTRAST  Technique:  Multidetector CT imaging of the head and cervical spine was performed following the standard protocol without intravenous contrast.  Multiplanar CT image reconstructions of the cervical spine were also generated.  Comparison:  10/28/2011 and earlier.  CT HEAD  Findings: Stable left maxillary sinus mucosal thickening.  Other Visualized paranasal sinuses and mastoids are clear.  No acute orbit or scalp soft  tissue findings. No acute osseous abnormality identified.  Calcified atherosclerosis at the skull base.  Stable ventricular prominence. No midline shift, mass effect, or evidence of mass lesion.  No acute intracranial hemorrhage identified.  No evidence of cortically based acute infarction identified.  Stable cerebral white matter hypodensity. No suspicious intracranial vascular hyperdensity.  IMPRESSION: Stable noncontrast CT appearance of the brain.  Cervical findings are below.  CT CERVICAL SPINE  Findings: Visualized paraspinal soft tissues are within normal limits.  Lung apices are clear.  Stable cervical vertebral height and alignment including trace anterolisthesis of C5 on C6. Visualized skull base is intact.  No atlanto-occipital dissociation.  Stable cervicothoracic junction alignment. Bilateral posterior element alignment is within normal limits.  Multilevel cervical facet hypertrophy is severe.  No acute cervical fracture. Chronic left clavicle fracture.  IMPRESSION: No  acute fracture or listhesis identified in the cervical spine. Ligamentous injury is not excluded.  Original Report Authenticated By: Harley Hallmark, M.D.   Ct Cervical Spine Wo Contrast  11/27/2011  *RADIOLOGY REPORT*  Clinical Data:  71 year old female found down.  Pain.  CT HEAD WITHOUT CONTRAST CT CERVICAL SPINE WITHOUT CONTRAST  Technique:  Multidetector CT imaging of the head and cervical spine was performed following the standard protocol without intravenous contrast.  Multiplanar CT image reconstructions of the cervical spine were also generated.  Comparison:  10/28/2011 and earlier.  CT HEAD  Findings: Stable left maxillary sinus mucosal thickening.  Other Visualized paranasal sinuses and mastoids are clear.  No acute orbit or scalp soft tissue findings. No acute osseous abnormality identified.  Calcified atherosclerosis at the skull base.  Stable ventricular prominence. No midline shift, mass effect, or evidence of mass lesion.   No acute intracranial hemorrhage identified.  No evidence of cortically based acute infarction identified.  Stable cerebral white matter hypodensity. No suspicious intracranial vascular hyperdensity.  IMPRESSION: Stable noncontrast CT appearance of the brain.  Cervical findings are below.  CT CERVICAL SPINE  Findings: Visualized paraspinal soft tissues are within normal limits.  Lung apices are clear.  Stable cervical vertebral height and alignment including trace anterolisthesis of C5 on C6. Visualized skull base is intact.  No atlanto-occipital dissociation.  Stable cervicothoracic junction alignment. Bilateral posterior element alignment is within normal limits.  Multilevel cervical facet hypertrophy is severe.  No acute cervical fracture. Chronic left clavicle fracture.  IMPRESSION: No acute fracture or listhesis identified in the cervical spine. Ligamentous injury is not excluded.  Original Report Authenticated By: Harley Hallmark, M.D.   Dg Knee Complete 4 Views Right  11/27/2011  *RADIOLOGY REPORT*  Clinical Data: 71 year old female status post fall.  RIGHT KNEE - COMPLETE 4+ VIEW  Comparison: 09/17/2009.  Findings: Bone mineralization is within normal limits for age. Patella appears stable and intact.  Joint spaces are preserved.  No acute fracture. No joint effusion.  IMPRESSION: No acute fracture or dislocation identified about the right knee.  Original Report Authenticated By: Harley Hallmark, M.D.     1. Fall       MDM  Presumed mechanical fall from a nursing home. Patient has history dementia is unable to recall the events. No obvious injuries.  Imaging of head and neck is negative. Patient's sons at bedside confirmed she is at her baseline mental status. They're comfortable with discharge back to her facility. I expressed concern of patient's multiple falls over the past month and a states it's because patient will not stay in her wheelchair as instructed. She lives in a 24-hour care  unit.   Date: 11/27/2011  Rate: 82  Rhythm: normal sinus rhythm  QRS Axis: normal  Intervals: QT prolonged  ST/T Wave abnormalities: nonspecific ST/T changes  Conduction Disutrbances:none  Narrative Interpretation:   Old EKG Reviewed: unchanged         Glynn Octave, MD 11/28/11 1104

## 2011-11-27 NOTE — ED Notes (Signed)
PT. ARRIVED WITH EMS FROM [facility] , FOUND BY STAFF ON THE FLOOR THIS EVENING , PT. UNABLE TO GIVE ACCOUNT ON INCIDENT DUE TO DEMENTIA , REPORTS PAIN AT LEFT KNEE /HEADACHE AND RIGHT ELBOW .  PT. ON LSB / C-COLLAR.

## 2011-11-27 NOTE — ED Notes (Signed)
[facility] NOTIFIED TO TRANSPORT PT. BACK TO [facility].

## 2011-11-27 NOTE — ED Notes (Signed)
REPORT GIVEN TO CHRISTA STATEN AT [facility] ON PT'S DISCHARGE TO THEIR FACILITY.

## 2011-11-27 NOTE — ED Notes (Signed)
TRANSPORTED TO X-RAY. 

## 2011-11-27 NOTE — ED Notes (Signed)
[facility] notified for pt transport to [facility].

## 2011-11-27 NOTE — ED Notes (Signed)
PT TRANSPORTED TO XRAY 

## 2011-11-27 NOTE — ED Notes (Signed)
LSB REMOVED BY EDP WITH ASSISTANCE OF RN .

## 2011-11-27 NOTE — ED Notes (Signed)
EKG completed at 2052 and given to Dr. Manus Gunning along with OLD ekg.

## 2012-01-19 ENCOUNTER — Emergency Department (HOSPITAL_COMMUNITY): Payer: Medicare Other

## 2012-01-19 ENCOUNTER — Emergency Department (HOSPITAL_COMMUNITY)
Admission: EM | Admit: 2012-01-19 | Discharge: 2012-01-19 | Disposition: A | Discharge status: Home / Self Care | Payer: Medicare Other | Attending: Emergency Medicine | Admitting: Emergency Medicine

## 2012-01-19 DIAGNOSIS — F0390 Unspecified dementia without behavioral disturbance: Secondary | ICD-10-CM | POA: Insufficient documentation

## 2012-01-19 DIAGNOSIS — T1490XA Injury, unspecified, initial encounter: Secondary | ICD-10-CM | POA: Insufficient documentation

## 2012-01-19 DIAGNOSIS — Y9210 Unspecified residential institution as the place of occurrence of the external cause: Secondary | ICD-10-CM | POA: Insufficient documentation

## 2012-01-19 DIAGNOSIS — M545 Low back pain, unspecified: Secondary | ICD-10-CM | POA: Insufficient documentation

## 2012-01-19 DIAGNOSIS — M25561 Pain in right knee: Secondary | ICD-10-CM

## 2012-01-19 DIAGNOSIS — W19XXXA Unspecified fall, initial encounter: Secondary | ICD-10-CM

## 2012-01-19 DIAGNOSIS — M25569 Pain in unspecified knee: Secondary | ICD-10-CM | POA: Insufficient documentation

## 2012-01-19 DIAGNOSIS — W050XXA Fall from non-moving wheelchair, initial encounter: Secondary | ICD-10-CM | POA: Insufficient documentation

## 2012-01-19 NOTE — ED Provider Notes (Signed)
History     CSN: [id number]  Arrival date & time 01/19/12  1540   First MD Initiated Contact with Patient 01/19/12 1552      Chief Complaint  Patient presents with  . Fall    NH Pt  (Consider location/radiation/quality/duration/timing/severity/associated sxs/prior treatment) HPI This 71 year old demented nursing home patient is sent to the ED after a witnessed fall from her wheelchair complaining of low back pain and right knee pain. There was no loss of consciousness or apparent head injury. There is no obvious deformity or apparent injury. Patient is acting as her usual confused self. The patient denies neck pain chest pain shortness breath abdominal pain or upper back pain. The patient complains of low back pain and right knee pain only. She denies any new lateralizing or focal weakness or numbness. Past Medical History  Diagnosis Date  . Dementia   . Osteoarthritis   . Epigastric pain   . Chronic pain   . Hyperlipidemia   . Osteopenia   . 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  No family history on file.  History  Substance Use Topics  . Smoking status: Unknown If Ever Smoked  . Smokeless tobacco: Not on file  . Alcohol Use: No    OB History    Grav Para Term Preterm Abortions TAB SAB Ect Mult Living                  Review of Systems  Unable to perform ROS: Dementia    Allergies  Review of patient's allergies indicates no known allergies.  Home Medications   Current Outpatient Rx  Name Route Sig Dispense Refill  . ACETAMINOPHEN 500 MG PO TABS Oral Take 500 mg by mouth every 6 (six) hours as needed. Pain.    . MELOXICAM 7.5 MG PO TABS Oral Take 7.5 mg by mouth daily.     Marland Kitchen PERPHENAZINE 4 MG PO TABS Oral Take 4 mg by mouth 2 (two) times daily. Until 01/30/2012.    Marland Kitchen SERTRALINE HCL 25 MG PO TABS Oral Take 25 mg by mouth daily.      BP 120/66  Pulse 95  Temp(Src) 98.8 F (37.1 C) (Oral)   Resp 22  SpO2 97%  Physical Exam  Nursing note and vitals reviewed. Constitutional:       Awake, alert, nontoxic appearance with baseline speech for patient.  HENT:  Head: Atraumatic.  Mouth/Throat: No oropharyngeal exudate.  Eyes: EOM are normal. Pupils are equal, round, and reactive to light. Right eye exhibits no discharge. Left eye exhibits no discharge.  Neck: Neck supple.  Cardiovascular: Normal rate and regular rhythm.   No murmur heard. Pulmonary/Chest: Effort normal and breath sounds normal. No stridor. No respiratory distress. She has no wheezes. She has no rales. She exhibits no tenderness.  Abdominal: Soft. Bowel sounds are normal. She exhibits no mass. There is no tenderness. There is no rebound.  Musculoskeletal: She exhibits tenderness. She exhibits no edema.       Baseline ROM, moves extremities with no obvious new focal weakness. Minimally tender lumbar spine and right anterior knee with no obvious right knee instability with good active and passive range of motion to the right knee with no obvious pain with active and passive range of motion of the right hip or left hip. Cervical spine is nontender upper back is nontender.  Lymphadenopathy:    She has no  cervical adenopathy.  Neurological: She is alert.       Awake, alert, cooperative but confused; motor strength bilaterally symmetric; sensation normal to light touch bilaterally; peripheral visual fields full to confrontation; no facial asymmetry; tongue midline; major cranial nerves appear intact, normal finger to nose bilaterally  Skin: No rash noted.  Psychiatric: She has a normal mood and affect.    ED Course  Procedures (including critical care time) The patient remains stable and smiling in the ED moving all 4 extremities well; her son agrees with the assessment and plan. Labs Reviewed - No data to display Dg Lumbar Spine Complete  01/19/2012  *RADIOLOGY REPORT*  Clinical Data: Fall.  Low back pain.  LUMBAR SPINE  - COMPLETE 4+ VIEW  Comparison: 10/08/2009 plain film exam of the abdomen.  Findings: Inferior vena cava filter in place.  One of the struts appears laterally located.  Degenerative changes lumbar spine with disc space narrowing L3-4 through L5-S1 and most notable at the L5-S1 level.  No acute fracture.  Aortic and branch vessel calcifications with ectasia of the aorta.  IMPRESSION: Degenerative changes without acute fracture.  Please see above.  Original Report Authenticated By: Fuller Canada, M.D.   Dg Pelvis 1-2 Views  01/19/2012  *RADIOLOGY REPORT*  Clinical Data: Fall.  Pain.  PELVIS - 1-2 VIEW  Comparison: 11/27/2011.  Findings: There is no evidence for fracture or dislocation on this AP pelvis view.  Retained stool is noted within the colon. Vascular calcification is seen within the aorto- iliac system. There are degenerative changes within the lower lumbar spine.  IMPRESSION: No evidence for recent fracture or dislocation.  Original Report Authenticated By: Rolla Plate, M.D.   Dg Knee Complete 4 Views Right  01/19/2012  *RADIOLOGY REPORT*  Clinical Data: Fall.  Knee pain.  RIGHT KNEE - COMPLETE 4+ VIEW  Comparison: 11/27/2011.  Findings: No fracture or dislocation.  IMPRESSION: No fracture.  Original Report Authenticated By: Fuller Canada, M.D.     1. Fall   2. Dementia   3. Low back pain   4. Right knee pain       MDM   Patient / Family / Caregiver understand and agree with initial ED impression and plan with expectations set for ED visit.Pt stable in ED with no significant deterioration in condition.Patient / Family / Caregiver informed of clinical course, understand medical decision-making process, and agree with plan.I doubt any other EMC precluding discharge at this time including, but not necessarily limited to the following:TBI.      Hurman Horn, MD 01/20/12 2137

## 2012-01-19 NOTE — Discharge Instructions (Signed)
SEEK IMMEDIATE MEDICAL ATTENTION IF: New numbness, tingling, weakness, or problem with the use of your arms or legs.  Severe back pain not relieved with medications.  Change in bowel or bladder control.  Increasing pain in any areas of the body (such as chest or abdominal pain).  Shortness of breath, dizziness or fainting.  Nausea (feeling sick to your stomach), vomiting, fever, or sweats.  Not every illness or injury can be identified during an emergency department visit, thus follow-up with your primary healthcare provider is important. Medical conditions can also worsen, so it is also important to return immediately as directed below, or if you have other serious concerns develop. RETURN IMMEDIATELY IF you develop new shortness of breath, chest pain, fever, have difficulty moving parts of your body (new weakness, numbness, or incoordination), sudden change in speech, vision, swallowing, or understanding, faint or develop new dizziness, severe headache, become poorly responsive or have an altered mental status compared to baseline for you, new rash, abdominal pain, or bloody stools,  Return sooner also if you develop new problems for which you have not talked to your caregiver but you feel may be emergency medical conditions, or are unable to be cared for safely at home.

## 2012-01-19 NOTE — ED Notes (Signed)
PTAR in to transport back to facility.

## 2012-01-19 NOTE — ED Notes (Signed)
ZOX:[id number]<AV> Expected date:<BR> Expected time: 3:32 PM<BR> Means of arrival:Ambulance<BR> Comments:<BR> M110 -- Fall

## 2012-01-19 NOTE — ED Notes (Signed)
Larey Seat out of w/c at nursing facility as witnessed by staff. No LOC. Fell into door jam. No obvious injury. C/O lower back pain and right elbow abrasion.

## 2012-01-19 NOTE — ED Notes (Signed)
PTAR requested 

## 2012-01-19 NOTE — ED Notes (Signed)
Son states he is not able to transport patient in his vehicle. PTAR requested

## 2012-02-01 ENCOUNTER — Encounter (HOSPITAL_COMMUNITY): Payer: Self-pay

## 2012-02-01 ENCOUNTER — Emergency Department (HOSPITAL_COMMUNITY): Payer: Medicare Other

## 2012-02-01 ENCOUNTER — Emergency Department (HOSPITAL_COMMUNITY)
Admission: EM | Admit: 2012-02-01 | Discharge: 2012-02-01 | Disposition: A | Discharge status: Home / Self Care | Payer: Medicare Other | Attending: Emergency Medicine | Admitting: Emergency Medicine

## 2012-02-01 DIAGNOSIS — N390 Urinary tract infection, site not specified: Secondary | ICD-10-CM

## 2012-02-01 DIAGNOSIS — G8929 Other chronic pain: Secondary | ICD-10-CM | POA: Insufficient documentation

## 2012-02-01 DIAGNOSIS — M1990 Unspecified osteoarthritis, unspecified site: Secondary | ICD-10-CM | POA: Insufficient documentation

## 2012-02-01 DIAGNOSIS — F0390 Unspecified dementia without behavioral disturbance: Secondary | ICD-10-CM | POA: Insufficient documentation

## 2012-02-01 DIAGNOSIS — R0602 Shortness of breath: Secondary | ICD-10-CM | POA: Insufficient documentation

## 2012-02-01 DIAGNOSIS — R51 Headache: Secondary | ICD-10-CM | POA: Insufficient documentation

## 2012-02-01 DIAGNOSIS — R4182 Altered mental status, unspecified: Secondary | ICD-10-CM | POA: Insufficient documentation

## 2012-02-01 DIAGNOSIS — W19XXXA Unspecified fall, initial encounter: Secondary | ICD-10-CM | POA: Insufficient documentation

## 2012-02-01 DIAGNOSIS — E785 Hyperlipidemia, unspecified: Secondary | ICD-10-CM | POA: Insufficient documentation

## 2012-02-01 HISTORY — DX: Acute gastroenteropathy due to Norwalk agent: A08.11

## 2012-02-01 LAB — URINE MICROSCOPIC-ADD ON

## 2012-02-01 LAB — CBC
HCT: 39 % (ref 36.0–46.0)
MCH: 29.4 pg (ref 26.0–34.0)
MCV: 88.8 fL (ref 78.0–100.0)
RDW: 13.5 % (ref 11.5–15.5)
WBC: 5.4 10*3/uL (ref 4.0–10.5)

## 2012-02-01 LAB — BLOOD GAS, VENOUS
Acid-Base Excess: 2.5 mmol/L — ABNORMAL HIGH (ref 0.0–2.0)
O2 Saturation: 46.6 %
Patient temperature: 98.6
TCO2: 24.9 mmol/L (ref 0–100)
pH, Ven: 7.394 — ABNORMAL HIGH (ref 7.250–7.300)

## 2012-02-01 LAB — DIFFERENTIAL
Basophils Absolute: 0 10*3/uL (ref 0.0–0.1)
Eosinophils Absolute: 0 10*3/uL (ref 0.0–0.7)
Eosinophils Relative: 1 % (ref 0–5)
Lymphocytes Relative: 22 % (ref 12–46)
Lymphs Abs: 1.2 10*3/uL (ref 0.7–4.0)
Monocytes Absolute: 0.7 10*3/uL (ref 0.1–1.0)

## 2012-02-01 LAB — COMPREHENSIVE METABOLIC PANEL
CO2: 27 mEq/L (ref 19–32)
Calcium: 9.1 mg/dL (ref 8.4–10.5)
Creatinine, Ser: 0.55 mg/dL (ref 0.50–1.10)
GFR calc Af Amer: >90 mL/min (ref >90)
GFR calc non Af Amer: >90 mL/min (ref >90)
Glucose, Bld: 90 mg/dL (ref 70–99)

## 2012-02-01 LAB — URINALYSIS, ROUTINE W REFLEX MICROSCOPIC
Bilirubin Urine: NEGATIVE
Nitrite: POSITIVE — AB
Protein, ur: NEGATIVE mg/dL
Urobilinogen, UA: 0.2 mg/dL (ref 0.0–1.0)

## 2012-02-01 LAB — GLUCOSE, CAPILLARY: Glucose-Capillary: 85 mg/dL (ref 70–99)

## 2012-02-01 LAB — PROCALCITONIN: Procalcitonin: <0.1 ng/mL

## 2012-02-01 LAB — LACTIC ACID, PLASMA: Lactic Acid, Venous: 0.9 mmol/L (ref 0.5–2.2)

## 2012-02-01 MED ORDER — CEPHALEXIN 500 MG PO CAPS: 500.0000 mg | ORAL_CAPSULE | Freq: Four times a day (QID) | ORAL | Status: AC

## 2012-02-01 MED ORDER — DEXTROSE 5 % IV SOLN
1.0000 g | Freq: Once | INTRAVENOUS | Status: AC
  Administered 2012-02-01: 1 g via INTRAVENOUS
  Filled 2012-02-01: qty 10

## 2012-02-01 NOTE — ED Notes (Signed)
ZOX:[id number]<AV> Expected date:<BR> Expected time:11:29 AM<BR> Means of arrival:Ambulance<BR> Comments:<BR> M20 -- Fall

## 2012-02-01 NOTE — ED Provider Notes (Signed)
History     CSN: [id number]  Arrival date & time 02/01/12  1134   First MD Initiated Contact with Patient 02/01/12 1212      Chief Complaint  Patient presents with  . Fall    (Consider location/radiation/quality/duration/timing/severity/associated sxs/prior treatment) HPI Patient is a 71 yo female with sever dementia who presents from nursing facility after an unwitnessed fall.  She denied any complaints here but was noted to have diarrhea.  She apparently gets frequent UTIs.  She was at her neurologic baseline upon arrival per nursing but on my evaluation the patient would only stare into space and grunt at painful stimuli.  Family was not present at the time of my evaluation.  History was otherwise limited secondary to patient condition. Past Medical History  Diagnosis Date  . Dementia   . Osteoarthritis   . Epigastric pain   . Chronic pain   . Hyperlipidemia   . Osteopenia   . 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 Norovirus   . Osteopenia     History reviewed. No pertinent past surgical history.  History reviewed. No pertinent family history.  History  Substance Use Topics  . Smoking status: Unknown If Ever Smoked  . Smokeless tobacco: Not on file  . Alcohol Use: No    OB History    Grav Para Term Preterm Abortions TAB SAB Ect Mult Living                  Review of Systems  Unable to perform ROS: Dementia    Allergies  Review of patient's allergies indicates no known allergies.  Home Medications   Current Outpatient Rx  Name Route Sig Dispense Refill  . ACETAMINOPHEN 325 MG PO TABS Oral Take 650 mg by mouth every 6 (six) hours as needed. For pain    . MELOXICAM 7.5 MG PO TABS Oral Take 7.5 mg by mouth daily.     Marland Kitchen PERPHENAZINE 4 MG PO TABS Oral Take 4 mg by mouth See admin instructions. Pt is currently being tapered off of medication. Currently taking 4mg  daily for 1 week.(01/31/12-02/06/12). Then pt will take 1 every other day  for 1 week(02/07/12-02/13/12) then stop medication    . SERTRALINE HCL 25 MG PO TABS Oral Take 25 mg by mouth daily.    . CEPHALEXIN 500 MG PO CAPS Oral Take 1 capsule (500 mg total) by mouth 4 (four) times daily. 40 capsule 0    BP 108/76  Pulse 85  Temp(Src) 98.6 F (37 C) (Oral)  Resp 17  SpO2 96%  Physical Exam  Nursing note and vitals reviewed. GEN: Well-developed, thin elderly female who stares into space and will not answer me HEENT: Atraumatic, normocephalic. Oropharynx clear without erythema EYES: PERRLA BL, no scleral icterus. NECK: Trachea midline, no meningismus, no midline TTP or step-off CV: regular rate and rhythm. No murmurs, rubs, or gallops PULM: No respiratory distress.  No crackles, wheezes, or rales. GI: soft, non-tender. No guarding, rebound, or tenderness. + bowel sounds  GU: deferred Neuro: Patient will grunt at painful stimuli and occasionally will look at me.  She will squeeze my hand when asked but will not let go on command.  No obvious focal deficits are noted otherwiese MSK: No deformity, edema, or injury noted Skin: No rashes petechiae, purpura, or jaundice   ED Course  Procedures (including critical care time)   Date: 02/01/2012  Rate: 87  Rhythm: normal sinus rhythm  and premature ventricular contractions (PVC)  QRS Axis: normal  Intervals: normal  ST/T Wave abnormalities: normal  Conduction Disutrbances:none  Narrative Interpretation:   Old EKG Reviewed: unchanged   Labs Reviewed  COMPREHENSIVE METABOLIC PANEL - Abnormal; Notable for the following:    Sodium 134 (*)    All other components within normal limits  URINALYSIS, ROUTINE W REFLEX MICROSCOPIC - Abnormal; Notable for the following:    APPearance CLOUDY (*)    Ketones, ur TRACE (*)    Nitrite POSITIVE (*)    Leukocytes, UA TRACE (*)    All other components within normal limits  BLOOD GAS, VENOUS - Abnormal; Notable for the following:    pH, Ven 7.394 (*)    pO2, Ven 28.8 (*)      Bicarbonate 27.5 (*)    Acid-Base Excess 2.5 (*)    All other components within normal limits  URINE MICROSCOPIC-ADD ON - Abnormal; Notable for the following:    Bacteria, UA MANY (*)    All other components within normal limits  CBC  DIFFERENTIAL  AMMONIA  LACTIC ACID, PLASMA  PROCALCITONIN  GLUCOSE, CAPILLARY  PROTIME-INR  BLOOD GAS, VENOUS  URINE CULTURE   Dg Chest 1 View  02/01/2012  *RADIOLOGY REPORT*  Clinical Data: Shortness of breath.  Altered mental status.  CHEST - 1 VIEW  Comparison: 11/27/2011 and 11/06/2011.  Findings: 1429 hours.  The heart size and mediastinal contours are stable.  There is stable mild chronic lung disease.  No superimposed airspace disease, edema or significant pleural effusion is identified.  Telemetry leads overlie the chest. Probable old fracture of the mid left clavicle is unchanged.  IMPRESSION: Stable chronic lung disease.  No acute cardiopulmonary process.  Original Report Authenticated By: Gerrianne Scale, M.D.   Ct Head Wo Contrast  02/01/2012  *RADIOLOGY REPORT*  Clinical Data:  Fall.  Found lying on the floor.  Dementia.  Head pain.  CT HEAD WITHOUT CONTRAST CT CERVICAL SPINE WITHOUT CONTRAST  Technique:  Multidetector CT imaging of the head and cervical spine was performed following the standard protocol without intravenous contrast.  Multiplanar CT image reconstructions of the cervical spine were also generated.  Comparison:  CT head and cervical spine 11/27/2011.  CT HEAD  Findings: Atrophy and extensive white matter disease is stable. Remote lacunar infarcts of the basal ganglia are again noted.  No acute cortical infarct, hemorrhage, mass lesion is present.  The ventricle size is stable proportionate to the degree of atrophy. No significant extra-axial fluid collection is present.  The paranasal sinuses and mastoid air cells are clear.  The osseous skull is intact.  No significant extracranial soft tissue injury is evident.  IMPRESSION:  1.   No acute intracranial abnormality or significant interval change. 2.  Stable atrophy and white matter disease.  CT CERVICAL SPINE  Findings: The cervical spine is imaged from skull base through T2. Degenerative anterolisthesis at C4-5 and C5-6 is stable. Spondylosis at C6-7 is again noted.  Multilevel facet degenerative changes are evident.  No acute fracture or traumatic subluxation is evident.  The soft tissues are unremarkable.  The lung apices are clear.  IMPRESSION:  1. 1.  Stable spondylosis of the cervical spine. 2.  No evidence for acute fracture or traumatic subluxation.  Original Report Authenticated By: Jamesetta Orleans. MATTERN, M.D.   Ct Cervical Spine Wo Contrast  02/01/2012  *RADIOLOGY REPORT*  Clinical Data:  Fall.  Found lying on the floor.  Dementia.  Head pain.  CT  HEAD WITHOUT CONTRAST CT CERVICAL SPINE WITHOUT CONTRAST  Technique:  Multidetector CT imaging of the head and cervical spine was performed following the standard protocol without intravenous contrast.  Multiplanar CT image reconstructions of the cervical spine were also generated.  Comparison:  CT head and cervical spine 11/27/2011.  CT HEAD  Findings: Atrophy and extensive white matter disease is stable. Remote lacunar infarcts of the basal ganglia are again noted.  No acute cortical infarct, hemorrhage, mass lesion is present.  The ventricle size is stable proportionate to the degree of atrophy. No significant extra-axial fluid collection is present.  The paranasal sinuses and mastoid air cells are clear.  The osseous skull is intact.  No significant extracranial soft tissue injury is evident.  IMPRESSION:  1.  No acute intracranial abnormality or significant interval change. 2.  Stable atrophy and white matter disease.  CT CERVICAL SPINE  Findings: The cervical spine is imaged from skull base through T2. Degenerative anterolisthesis at C4-5 and C5-6 is stable. Spondylosis at C6-7 is again noted.  Multilevel facet degenerative  changes are evident.  No acute fracture or traumatic subluxation is evident.  The soft tissues are unremarkable.  The lung apices are clear.  IMPRESSION:  1. 1.  Stable spondylosis of the cervical spine. 2.  No evidence for acute fracture or traumatic subluxation.  Original Report Authenticated By: Jamesetta Orleans. MATTERN, M.D.     1. UTI (urinary tract infection)   2. Fall       MDM  Upon my evaluation patient had had a significant mental status change.  She had work-up for her unwitnessed fall wit negative head CT and CT c-cspine.  CXR  And laboratory work-up were unremarkable but UA was concerning for UTI.  Patient had complete resolution of her symptoms in the ED.  She was given a dose of Rocephin here.  Her family arrived and reported that it is not uncommon for the patient to refuse to speak to anyone and to respond as she did to me.  They are not concerned about this and feel it is likely just a representation of some of her normal behaviors.  Patient had urine culture sent and the patient was discharged with a prescription for keflex and instructions to follow-up with her regular doctor.        Cyndra Numbers, MD 02/02/12 1256

## 2012-02-01 NOTE — ED Notes (Signed)
CBG registered 85 on ED Glucometer. 

## 2012-02-01 NOTE — Discharge Instructions (Signed)

## 2012-02-01 NOTE — ED Notes (Addendum)
Pt picked up from [facility] by EMS, had an unwitnessed fall this AM, found lying supine on floor, hx of demential, no LOC, no head injury. initially c/o back pain and head pain, denied pain at the moment. Poor historian. Skin appeared good, full ROM on all extremities. pt remember the fall but doesn't now why she fell. Pt alert, oriented. Has norovirus, and there are 14 residents that currently have nororvirus at the nursing facility the pt came from

## 2012-02-01 NOTE — ED Notes (Signed)
THis Primary Rn returned from [place], [place] reports that pt's status has changed and was first initially noticed by Dr. Alto Denver during her MD assessment. Per NT, pt was responding inappropriately to Dr. Alto Denver, unable to answer her questions, and no eye contact as if she just stares at the ceiling

## 2012-02-01 NOTE — ED Notes (Addendum)
MD Hunt called me  NT in to room to see if patients status was baseline. Pt not responding to voice or touch  Like she was upon arrival. Pt had stare look on face and was not able to tell us her name. Pt only humming every now and then. Upon arrival patient was able to recall first name.

## 2012-02-01 NOTE — ED Notes (Signed)
Pt returned from CT °

## 2012-02-01 NOTE — ED Notes (Signed)
PTAR called, report given to [facility] place

## 2012-02-01 NOTE — ED Notes (Signed)
Per family pt is acting her baseline now.

## 2012-02-01 NOTE — ED Notes (Signed)
[facility] at bedside 

## 2012-02-01 NOTE — ED Notes (Signed)
This primary RN went to [facility]. Report give to [facility]

## 2012-02-03 LAB — URINE CULTURE

## 2012-02-10 ENCOUNTER — Encounter (HOSPITAL_COMMUNITY): Payer: Self-pay | Admitting: Emergency Medicine

## 2012-02-10 ENCOUNTER — Emergency Department (HOSPITAL_COMMUNITY)
Admission: EM | Admit: 2012-02-10 | Discharge: 2012-02-10 | Disposition: A | Discharge status: Skilled Nursing Facility | Payer: Medicare Other | Attending: Emergency Medicine | Admitting: Emergency Medicine

## 2012-02-10 DIAGNOSIS — F0390 Unspecified dementia without behavioral disturbance: Secondary | ICD-10-CM | POA: Insufficient documentation

## 2012-02-10 DIAGNOSIS — M1990 Unspecified osteoarthritis, unspecified site: Secondary | ICD-10-CM | POA: Insufficient documentation

## 2012-02-10 DIAGNOSIS — W19XXXA Unspecified fall, initial encounter: Secondary | ICD-10-CM

## 2012-02-10 DIAGNOSIS — E785 Hyperlipidemia, unspecified: Secondary | ICD-10-CM | POA: Insufficient documentation

## 2012-02-10 DIAGNOSIS — M899 Disorder of bone, unspecified: Secondary | ICD-10-CM | POA: Insufficient documentation

## 2012-02-10 DIAGNOSIS — R51 Headache: Secondary | ICD-10-CM | POA: Insufficient documentation

## 2012-02-10 DIAGNOSIS — Z87898 Personal history of other specified conditions: Secondary | ICD-10-CM | POA: Insufficient documentation

## 2012-02-10 DIAGNOSIS — G8929 Other chronic pain: Secondary | ICD-10-CM | POA: Insufficient documentation

## 2012-02-10 DIAGNOSIS — M949 Disorder of cartilage, unspecified: Secondary | ICD-10-CM | POA: Insufficient documentation

## 2012-02-10 NOTE — ED Notes (Signed)
Pt to ED via [facility] after reported slipping out of a wheelchair at Nursing Home and bumping her head.  Pt has dementia and does not know why she is here

## 2012-02-10 NOTE — ED Provider Notes (Signed)
History   This chart was scribed for Shari Co, MD by Clarita Crane. The patient was seen in room STRE7/STRE7. Patient's care was started at 1657.    CSN: [id number]  Arrival date & time 02/10/12  1657   None     Chief Complaint  Patient presents with  . Fall    (Consider location/radiation/quality/duration/timing/severity/associated sxs/prior treatment) HPI Level 5 Caveat applies due to Dementia Shari Dean is a 71 y.o. female who presents to the Emergency Department for evaluation after falling out of wheelchair this afternoon and striking posterior aspect of head. Patient currently c/o mild pain to posterior aspect of head. Patient with h/o dementia, HLD, osteoarthritis.  Past Medical History  Diagnosis Date  . Dementia   . Osteoarthritis   . Epigastric pain   . Chronic pain   . Hyperlipidemia   . Osteopenia   . 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 Norovirus   . Osteopenia     History reviewed. No pertinent past surgical history.  No family history on file.  History  Substance Use Topics  . Smoking status: Unknown If Ever Smoked  . Smokeless tobacco: Not on file  . Alcohol Use: No    OB History    Grav Para Term Preterm Abortions TAB SAB Ect Mult Living                  Review of Systems  Unable to perform ROS: Dementia    Allergies  Review of patient's allergies indicates no known allergies.  Home Medications   Current Outpatient Rx  Name Route Sig Dispense Refill  . ACETAMINOPHEN 325 MG PO TABS Oral Take 650 mg by mouth every 6 (six) hours as needed. For pain    . CEPHALEXIN 500 MG PO CAPS Oral Take 1 capsule (500 mg total) by mouth 4 (four) times daily. 40 capsule 0  . MELOXICAM 7.5 MG PO TABS Oral Take 7.5 mg by mouth every morning.     Marland Kitchen PERPHENAZINE 4 MG PO TABS Oral Take 4 mg by mouth See admin instructions. Pt is currently being tapered off of medication. Currently taking 4mg  daily for 1  week.(01/31/12-02/06/12). Then pt will take 1 every other day for 1 week(02/07/12-02/13/12) then stop medication    . SERTRALINE HCL 25 MG PO TABS Oral Take 25 mg by mouth every morning.       BP 138/81  Pulse 70  Temp(Src) 98.1 F (36.7 C) (Oral)  Resp 16  SpO2 96%  Physical Exam  Nursing note and vitals reviewed. Constitutional: She appears well-developed and well-nourished. No distress.  HENT:  Head: Normocephalic and atraumatic.  Mouth/Throat: Oropharynx is clear and moist.       No hematomas noted. No signs of injury noted.   Eyes: EOM are normal. Pupils are equal, round, and reactive to light.  Neck: Neck supple. No tracheal deviation present.  Cardiovascular: Normal rate and regular rhythm.  Exam reveals no gallop and no friction rub.   No murmur heard. Pulmonary/Chest: Effort normal. No respiratory distress. She has no wheezes. She has no rales.  Abdominal: Soft. She exhibits no distension.  Musculoskeletal: Normal range of motion. She exhibits no edema.       FROM of left wrist with no tenderness. FROM bilateral lower extremities including hips, knees and ankles.   Neurological: She is alert. No sensory deficit.  Skin: Skin is warm and dry.  Psychiatric: She has a  normal mood and affect. Her behavior is normal.    ED Course  Procedures (including critical care time)  DIAGNOSTIC STUDIES: Oxygen Saturation is 96% on room air, adequate by my interpretation.    COORDINATION OF CARE:    Labs Reviewed - No data to display No results found.   1. Fall       MDM  The patient is not on anticoagulants.  She has no cervical spine step-offs.  There is no signs of trauma to her posterior head.  She has no ecchymosis or swelling.  Reported the patient is at baseline mental status.  She has full range of motion of her major joints bilaterally in both her upper lower extremities.  There is no signs of trauma.  She is pleasantly demented      I personally performed the  services described in this documentation, which was scribed in my presence. The recorded information has been reviewed and considered.      Shari Co, MD 02/10/12 [phone]

## 2012-02-10 NOTE — ED Notes (Signed)
PTAR called to transport pt back to Nursing Home. 

## 2012-02-10 NOTE — ED Notes (Signed)
Attempted to call report to Nursing Home ([facility]) received a answering machine.  Discharge instructions given to pt's son's who are going back to Nursing Home with pt.  PTAR transported pt back.

## 2012-02-10 NOTE — ED Notes (Signed)
Pt denies any pain.   St's she is ready to go.

## 2012-03-23 ENCOUNTER — Emergency Department (HOSPITAL_COMMUNITY): Payer: Medicare Other

## 2012-03-23 ENCOUNTER — Emergency Department (HOSPITAL_COMMUNITY)
Admission: EM | Admit: 2012-03-23 | Discharge: 2012-03-24 | Disposition: A | Discharge status: Home / Self Care | Payer: Medicare Other | Attending: Emergency Medicine | Admitting: Emergency Medicine

## 2012-03-23 ENCOUNTER — Encounter (HOSPITAL_COMMUNITY): Payer: Self-pay | Admitting: Emergency Medicine

## 2012-03-23 DIAGNOSIS — T1490XA Injury, unspecified, initial encounter: Secondary | ICD-10-CM | POA: Insufficient documentation

## 2012-03-23 DIAGNOSIS — Y9210 Unspecified residential institution as the place of occurrence of the external cause: Secondary | ICD-10-CM | POA: Insufficient documentation

## 2012-03-23 DIAGNOSIS — M5030 Other cervical disc degeneration, unspecified cervical region: Secondary | ICD-10-CM | POA: Insufficient documentation

## 2012-03-23 DIAGNOSIS — F0390 Unspecified dementia without behavioral disturbance: Secondary | ICD-10-CM | POA: Insufficient documentation

## 2012-03-23 DIAGNOSIS — S0003XA Contusion of scalp, initial encounter: Secondary | ICD-10-CM | POA: Insufficient documentation

## 2012-03-23 DIAGNOSIS — W19XXXA Unspecified fall, initial encounter: Secondary | ICD-10-CM | POA: Insufficient documentation

## 2012-03-23 DIAGNOSIS — R51 Headache: Secondary | ICD-10-CM | POA: Insufficient documentation

## 2012-03-23 DIAGNOSIS — M899 Disorder of bone, unspecified: Secondary | ICD-10-CM | POA: Insufficient documentation

## 2012-03-23 DIAGNOSIS — S1093XA Contusion of unspecified part of neck, initial encounter: Secondary | ICD-10-CM | POA: Insufficient documentation

## 2012-03-23 DIAGNOSIS — M542 Cervicalgia: Secondary | ICD-10-CM | POA: Insufficient documentation

## 2012-03-23 NOTE — ED Notes (Signed)
Per EMS pt fell out of a wheelchair at the nursing home, [facility], and struck her head  Pt has a hematoma to the left side of her forehead  Pt is c/o pain all over  Pt has dementia  Pt is at her norm per nursing home

## 2012-03-23 NOTE — ED Provider Notes (Signed)
History     CSN: [id number]  Arrival date & time 03/23/12  2308   First MD Initiated Contact with Patient 03/23/12 2346      Chief Complaint  Patient presents with  . Fall    (Consider location/radiation/quality/duration/timing/severity/associated sxs/prior treatment) HPI Comments: You have a history of dementia, and frequent falls, listed in nursing him predominantly in a wheelchair.  She was observed falling forward out of her wheelchair hitting her head.  No loss of consciousness.  No vomiting, no in the skin or hematoma  Patient is a 71 y.o. female presenting with fall. The history is provided by the nursing home and the patient.  Fall The accident occurred 3 to 5 hours ago. She fell from a height of 1 to 2 ft. The point of impact was the head. The pain is at a severity of 1/10. The pain is mild. She was not ambulatory at the scene. There was no alcohol use involved in the accident. Associated symptoms include headaches. Pertinent negatives include no fever.    Past Medical History  Diagnosis Date  . Dementia   . Osteoarthritis   . Epigastric pain   . Chronic pain   . Hyperlipidemia   . Osteopenia   . 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 Norovirus   . Osteopenia     History reviewed. No pertinent past surgical history.  History reviewed. No pertinent family history.  History  Substance Use Topics  . Smoking status: Unknown If Ever Smoked  . Smokeless tobacco: Not on file  . Alcohol Use: No    OB History    Grav Para Term Preterm Abortions TAB SAB Ect Mult Living                  Review of Systems  Constitutional: Negative for fever.  HENT: Negative for rhinorrhea and neck pain.   Musculoskeletal: Negative for back pain.  Skin: Negative for wound.  Neurological: Positive for headaches. Negative for dizziness and weakness.    Allergies  Review of patient's allergies indicates no known allergies.  Home Medications    Current Outpatient Rx  Name Route Sig Dispense Refill  . CIPROFLOXACIN HCL 250 MG PO TABS Oral Take 250 mg by mouth 2 (two) times daily. For 7 days only  Stop date:03/25/12    . DIVALPROEX SODIUM 250 MG PO TBEC Oral Take 250 mg by mouth 2 (two) times daily.    . MELOXICAM 7.5 MG PO TABS Oral Take 7.5 mg by mouth every morning.     Marland Kitchen RISPERIDONE 0.5 MG PO TABS Oral Take 0.5 mg by mouth at bedtime.     . SERTRALINE HCL 25 MG PO TABS Oral Take 25 mg by mouth every morning.     . ACETAMINOPHEN 325 MG PO TABS Oral Take 650 mg by mouth every 6 (six) hours as needed. For pain      BP 118/51  Pulse 69  Temp(Src) 97.5 F (36.4 C) (Oral)  Resp 20  SpO2 99%  Physical Exam  Constitutional: She appears well-developed and well-nourished.  HENT:  Head: Normocephalic and atraumatic.       No hematoma/breaks in the skin  Eyes: Pupils are equal, round, and reactive to light.  Cardiovascular: Normal rate.   Pulmonary/Chest: Effort normal.  Abdominal: Soft.  Musculoskeletal: Normal range of motion.  Neurological: She is alert. No cranial nerve deficit. Coordination normal.  Skin: Skin is warm.  ED Course  Procedures (including critical care time)  Labs Reviewed - No data to display Dg Pelvis 1-2 Views  03/24/2012  *RADIOLOGY REPORT*  Clinical Data: History of fall.  Now unable to straighten legs completely.  PELVIS - 1-2 VIEW  Comparison: 01/19/2012.  Findings: AP of the view of the pelvis demonstrates some mild irregularity of the sacral ala bilaterally, which could suggest underlying insufficiency fractures.  The remainder of the bony pelvic ring otherwise appears intact.  Visualized portions of the proximal femurs bilaterally also appear intact. Bones are osteopenic.  Atherosclerotic calcifications within the distal abdominal aorta and iliac vasculature.  IMPRESSION: 1.  Mild irregularity of the sacral ala bilaterally could suggest the presence of insufficiency fractures.  This could be  better evaluated with CT if clinically indicated. 2.  Osteopenia. 3.  Atherosclerosis.  Original Report Authenticated By: Florencia Reasons, M.D.   Ct Head Wo Contrast  03/24/2012  *RADIOLOGY REPORT*  Clinical Data:  History of trauma after fall.  Head and neck pain.  CT HEAD WITHOUT CONTRAST CT CERVICAL SPINE WITHOUT CONTRAST  Technique:  Multidetector CT imaging of the head and cervical spine was performed following the standard protocol without intravenous contrast.  Multiplanar CT image reconstructions of the cervical spine were also generated.  Comparison:  Head CT 02/01/2012.  C-spine CT 02/01/2012.  CT HEAD  Findings: No acute displaced fractures of the skull are identified. There is some soft tissue swelling in the left frontal scalp.  Mild cerebral and cerebellar atrophy is noted.  There is some dilatation of the ventricular system which is similar to prior examinations; this may be ex vacuo, however, the degree of ventricular dilatation is somewhat out of proportion to the degree of atrophy.  There are patchy and confluent areas of decreased attenuation throughout the deep and periventricular white matter of the cerebral hemispheres bilaterally, suggestive of chronic microvascular ischemic disease. No evidence of acute intracranial hemorrhage, no definite evidence of acute/subacute cerebral ischemia, no focal mass or mass effect.  IMPRESSION: 1.  Small left frontal scalp hematoma without evidence of underlying displaced skull fracture or definite acute intracranial abnormality. 2.  Again noted is mild cerebral and cerebellar atrophy and chronic microvascular ischemic changes in the white matter.  The ventricular system appears mildly dilated, which may simply be ex vacuo, however, the degree of dilatation may be slightly out of proportion to the degree of cerebral atrophy.  Clinical correlation for signs and symptoms of normal pressure hydrocephalous is recommended.  CT CERVICAL SPINE  Findings: No  acute displaced fractures of the cervical spine are noted.  Prevertebral soft tissues are normal.  There is multilevel degenerative disc disease, most severe at C5-C6 and C6-C7, as well as multilevel facet arthropathy throughout the entire cervical spine.  At C5-C6 there is a 2 mm of anterolisthesis of C5, favored to be chronic.  Alignment is otherwise anatomic.  Visualized portions of the lung apices are remarkable for bilateral apical pleuroparenchymal thickening, presumably post infectious scarring.  IMPRESSION: 1.  No definite evidence of significant acute traumatic injury to the cervical spine. 2.  There is multilevel degenerative disc disease and cervical spondylosis, including a 2 mm anterolisthesis of C5 upon C6, which is favored to be a chronic finding.  Original Report Authenticated By: Florencia Reasons, M.D.   Ct Cervical Spine Wo Contrast  03/24/2012  *RADIOLOGY REPORT*  Clinical Data:  History of trauma after fall.  Head and neck pain.  CT HEAD WITHOUT CONTRAST CT CERVICAL  SPINE WITHOUT CONTRAST  Technique:  Multidetector CT imaging of the head and cervical spine was performed following the standard protocol without intravenous contrast.  Multiplanar CT image reconstructions of the cervical spine were also generated.  Comparison:  Head CT 02/01/2012.  C-spine CT 02/01/2012.  CT HEAD  Findings: No acute displaced fractures of the skull are identified. There is some soft tissue swelling in the left frontal scalp.  Mild cerebral and cerebellar atrophy is noted.  There is some dilatation of the ventricular system which is similar to prior examinations; this may be ex vacuo, however, the degree of ventricular dilatation is somewhat out of proportion to the degree of atrophy.  There are patchy and confluent areas of decreased attenuation throughout the deep and periventricular white matter of the cerebral hemispheres bilaterally, suggestive of chronic microvascular ischemic disease. No evidence of acute  intracranial hemorrhage, no definite evidence of acute/subacute cerebral ischemia, no focal mass or mass effect.  IMPRESSION: 1.  Small left frontal scalp hematoma without evidence of underlying displaced skull fracture or definite acute intracranial abnormality. 2.  Again noted is mild cerebral and cerebellar atrophy and chronic microvascular ischemic changes in the white matter.  The ventricular system appears mildly dilated, which may simply be ex vacuo, however, the degree of dilatation may be slightly out of proportion to the degree of cerebral atrophy.  Clinical correlation for signs and symptoms of normal pressure hydrocephalous is recommended.  CT CERVICAL SPINE  Findings: No acute displaced fractures of the cervical spine are noted.  Prevertebral soft tissues are normal.  There is multilevel degenerative disc disease, most severe at C5-C6 and C6-C7, as well as multilevel facet arthropathy throughout the entire cervical spine.  At C5-C6 there is a 2 mm of anterolisthesis of C5, favored to be chronic.  Alignment is otherwise anatomic.  Visualized portions of the lung apices are remarkable for bilateral apical pleuroparenchymal thickening, presumably post infectious scarring.  IMPRESSION: 1.  No definite evidence of significant acute traumatic injury to the cervical spine. 2.  There is multilevel degenerative disc disease and cervical spondylosis, including a 2 mm anterolisthesis of C5 upon C6, which is favored to be a chronic finding.  Original Report Authenticated By: Florencia Reasons, M.D.     1. Fall       MDM   Patient is complaining of headache.  Will CT her head and neck.  She is not on any anticoagulant, but has a history of frequent falls        Arman Filter, NP 03/24/12 [phone]

## 2012-03-24 ENCOUNTER — Emergency Department (HOSPITAL_COMMUNITY): Payer: Medicare Other

## 2012-03-24 NOTE — ED Notes (Signed)
Patient transported to X-ray 

## 2012-03-24 NOTE — ED Provider Notes (Signed)
Medical screening examination/treatment/procedure(s) were performed by non-physician practitioner and as supervising physician I was immediately available for consultation/collaboration.    Darleene Cumpian D Daniel Johndrow, MD 03/24/12 0427 

## 2012-03-24 NOTE — ED Notes (Signed)
[facility] called for transport back to nursing home. 

## 2012-03-24 NOTE — ED Notes (Signed)
Returned from xray

## 2012-04-17 ENCOUNTER — Encounter (HOSPITAL_COMMUNITY): Payer: Self-pay | Admitting: Physical Medicine and Rehabilitation

## 2012-04-17 ENCOUNTER — Emergency Department (HOSPITAL_COMMUNITY): Payer: Medicare Other

## 2012-04-17 ENCOUNTER — Emergency Department (HOSPITAL_COMMUNITY)
Admission: EM | Admit: 2012-04-17 | Discharge: 2012-04-17 | Disposition: A | Discharge status: Home / Self Care | Payer: Medicare Other | Attending: Emergency Medicine | Admitting: Emergency Medicine

## 2012-04-17 DIAGNOSIS — F29 Unspecified psychosis not due to a substance or known physiological condition: Secondary | ICD-10-CM | POA: Insufficient documentation

## 2012-04-17 DIAGNOSIS — F0390 Unspecified dementia without behavioral disturbance: Secondary | ICD-10-CM | POA: Insufficient documentation

## 2012-04-17 DIAGNOSIS — R079 Chest pain, unspecified: Secondary | ICD-10-CM | POA: Insufficient documentation

## 2012-04-17 LAB — CBC WITH DIFFERENTIAL/PLATELET
HCT: 38.3 % (ref 36.0–46.0)
Hemoglobin: 13 g/dL (ref 12.0–15.0)
Lymphocytes Relative: 8 % — ABNORMAL LOW (ref 12–46)
Lymphs Abs: 0.3 10*3/uL — ABNORMAL LOW (ref 0.7–4.0)
Monocytes Absolute: 0.3 10*3/uL (ref 0.1–1.0)
Monocytes Relative: 8 % (ref 3–12)
Neutro Abs: 3.2 10*3/uL (ref 1.7–7.7)
Neutrophils Relative %: 83 % — ABNORMAL HIGH (ref 43–77)
RBC: 4.33 MIL/uL (ref 3.87–5.11)
WBC: 3.9 10*3/uL — ABNORMAL LOW (ref 4.0–10.5)

## 2012-04-17 LAB — BASIC METABOLIC PANEL
BUN: 14 mg/dL (ref 6–23)
Chloride: 100 mEq/L (ref 96–112)
GFR calc Af Amer: >90 mL/min (ref >90)
GFR calc non Af Amer: 88 mL/min — ABNORMAL LOW (ref >90)
Glucose, Bld: 108 mg/dL — ABNORMAL HIGH (ref 70–99)
Potassium: 4.3 mEq/L (ref 3.5–5.1)
Sodium: 139 mEq/L (ref 135–145)

## 2012-04-17 LAB — URINALYSIS, ROUTINE W REFLEX MICROSCOPIC
Bilirubin Urine: NEGATIVE
Hgb urine dipstick: NEGATIVE
Nitrite: NEGATIVE
Specific Gravity, Urine: 1.019 (ref 1.005–1.030)
Urobilinogen, UA: 1 mg/dL (ref 0.0–1.0)
pH: 8 (ref 5.0–8.0)

## 2012-04-17 MED ORDER — ACETAMINOPHEN 325 MG PO TABS
ORAL_TABLET | ORAL | Status: AC
  Filled 2012-04-17: qty 2

## 2012-04-17 MED ORDER — ACETAMINOPHEN 325 MG PO TABS: 650.0000 mg | ORAL_TABLET | Freq: Once | ORAL | Status: DC

## 2012-04-17 MED ORDER — ACETAMINOPHEN 325 MG PO TABS
650.0000 mg | ORAL_TABLET | Freq: Once | ORAL | Status: AC
  Administered 2012-04-17: 650 mg via ORAL

## 2012-04-17 NOTE — ED Provider Notes (Signed)
I saw and evaluated the patient, reviewed the resident's note and I agree with the findings and plan   Date: 04/17/2012  Rate: 104  Rhythm: sinus tachycardia  QRS Axis: normal  Intervals: normal  ST/T Wave abnormalities: nonspecific T wave changes  Conduction Disutrbances:none  Narrative Interpretation: multiple PVCs  Old EKG Reviewed: no significant change from previous  Alert to baseline in ED. No concern for acute stroke. Pt apparently leaning to left when in bed only. Imaging and labs unremarkable. Freq PVCs, similar to previous. Family and pt comfortable with discharge home. Doubt [facility] at this time. Plans for ambulation and home. Will f/u with PmD  Forbes Cellar, MD 04/17/12 2148

## 2012-04-17 NOTE — ED Notes (Signed)
Resident at the bedside to explain discharge. Pt and family member had no further questions. Vital signs stable. [facility] called for transport back to facility.

## 2012-04-17 NOTE — ED Notes (Signed)
Code Stoke cancelled per Dr. Ethelda Chick. Pt taken directly to CT scan.

## 2012-04-17 NOTE — ED Provider Notes (Signed)
History     CSN: [id number]  Arrival date & time 04/17/12  1443   First MD Initiated Contact with Patient 04/17/12 1507      Chief Complaint  Patient presents with  . Altered Mental Status    (Consider location/radiation/quality/duration/timing/severity/associated sxs/prior treatment) HPI  71 year old female past history dementia normally all oriented to person only per EMS report, hyperlipidemia chronic pain, osteopenia chronic pain presenting from nursing home with a complaint by the staff there of altered mental status above baseline as well as her body all listing to the left. They apparently denied any other concerns for the patient including fevers, cough, chest pain or complaints of pain.  On arrival she seemed to be stable with a mild tachycardia  Past Medical History  Diagnosis Date  . Dementia   . Osteoarthritis   . Epigastric pain   . Chronic pain   . Hyperlipidemia   . Osteopenia   . 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 Norovirus   . Osteopenia     No past surgical history on file.  No family history on file.  History  Substance Use Topics  . Smoking status: Unknown If Ever Smoked  . Smokeless tobacco: Not on file  . Alcohol Use: No    OB History    Grav Para Term Preterm Abortions TAB SAB Ect Mult Living                  Review of Systems Level 5 caveat   Allergies  Review of patient's allergies indicates no known allergies.  Home Medications   Current Outpatient Rx  Name Route Sig Dispense Refill  . ACETAMINOPHEN 325 MG PO TABS Oral Take 650 mg by mouth every 6 (six) hours as needed. For pain    . DIVALPROEX SODIUM ER 250 MG PO TB24 Oral Take 250 mg by mouth 2 (two) times daily.    . MELOXICAM 7.5 MG PO TABS Oral Take 7.5 mg by mouth every morning.     Marland Kitchen NITROFURANTOIN MONOHYD MACRO 100 MG PO CAPS Oral Take 100 mg by mouth 2 (two) times daily.    Marland Kitchen RISPERIDONE 0.5 MG PO TABS Oral Take 0.5 mg by mouth at  bedtime.     . SERTRALINE HCL 25 MG PO TABS Oral Take 25 mg by mouth every morning.       BP 131/75  Pulse 107  Temp 97.9 F (36.6 C) (Oral)  Resp 20  SpO2 99%  Physical Exam Consitutional: Pt in no acute distress.   Head: Normocephalic and atraumatic.  Eyes: Extraocular motion intact, no scleral icterus Neck: Supple without meningismus, mass, or overt JVD Respiratory: Effort normal and breath sounds normal. No respiratory distress. CV: Heart regular rate and rhythm, no obvious murmurs.  Pulses +2 and symmetric Abdomen: Soft, non-tender, non-distended MSK: Extremities are atraumatic without deformity, ROM intact Skin: Warm, dry, intact. No signs of gross external trauma. Neuro: Alert and oriented to person only, no motor deficit noted.  Good intrinsic hand strength bilaterally. Thigh and lower leg and ankle strength appears maintained.  PERRL.  Reflexes normal BLE, no clonus.  Unable to complete formal finger to nose testing  CNs 2-12 normal.      ED Course  Procedures (including critical care time)  Labs Reviewed  CBC WITH DIFFERENTIAL - Abnormal; Notable for the following:    WBC 3.9 (*)     Platelets 129 (*)  Neutrophils Relative 83 (*)     Lymphocytes Relative 8 (*)     Lymphs Abs 0.3 (*)     All other components within normal limits  URINALYSIS, ROUTINE W REFLEX MICROSCOPIC - Abnormal; Notable for the following:    APPearance TURBID (*)     All other components within normal limits  BASIC METABOLIC PANEL - Abnormal; Notable for the following:    Glucose, Bld 108 (*)     GFR calc non Af Amer 88 (*)     All other components within normal limits  URINE MICROSCOPIC-ADD ON   Dg Chest 2 View  04/17/2012  *RADIOLOGY REPORT*  Clinical Data: Altered mental status.  Chest pain.  CHEST - 2 VIEW  Comparison: Chest x-ray 02/01/2012.  Findings: Lung volumes are normal.  Linear bibasilar opacities are favored to represent areas of subsegmental atelectasis.  There are new  peripheral somewhat nodular opacities in the lateral aspect of the right mid and lower lung.  No pleural effusions.  Pulmonary vasculature is normal.  Heart size is normal.  Mediastinal contours are unremarkable.  Atherosclerotic calcifications are noted within the arch of the aorta.  Old healed left midclavicular fracture with post-traumatic deformity again noted.  IMPRESSION: 1.  New somewhat nodular opacities in the right mid and lower lung. In the appropriate clinical setting, these could represent sites of early infection.  However, they are highly nonspecific.  Follow-up chest radiograph in 1-2 months is recommended to ensure complete resolution of these findings. 2.  Bibasilar subsegmental atelectasis. 3.  Atherosclerosis.  Original Report Authenticated By: Florencia Reasons, M.D.   Ct Head Wo Contrast  04/17/2012  *RADIOLOGY REPORT*  Clinical Data: Code stroke.  Leaning to the left side.  Confusion and disorientation.  Dementia.  CT HEAD WITHOUT CONTRAST  Technique:  Contiguous axial images were obtained from the base of the skull through the vertex without contrast.  Comparison: Head CT 03/24/2012.  Findings: Again noted is moderate cerebral and mild cerebellar atrophy.  There is associated ex vacuo dilatation of the ventricular system (unchanged).  Patchy and confluent areas of decreased attenuation throughout the deep and periventricular white matter of the cerebral hemispheres bilaterally is similar to the prior examination and compatible with chronic microvascular ischemic changes.  No definite acute intracranial abnormality. Specifically, no definite signs of acute/subacute cerebral ischemia, no acute intraparenchymal hemorrhage, no focal mass, mass effect, hydrocephalus or abnormal intra or extra-axial fluid collections.  No acute displaced skull fractures are identified. Visualized paranasal sinuses and mastoids are well pneumatized.  IMPRESSION: 1.  No acute intracranial abnormalities. 2.   Cerebral and cerebellar atrophy with chronic microvascular ischemic changes in the cerebral white matter redemonstrated, as above.  Original Report Authenticated By: Florencia Reasons, M.D.     1. Dementia       MDM    Patient was apparently initially code stroke was called off on arrival. Patient has a dense dementia only oriented to person. She is no clear  Neurological deficit at this time. The left leaning complaint seems to be resolved and was a matter of positioning on the bed. Will complete basic laboratory work.  The patient's son is now here. He endorses that the "confusion" described by her caregivers is typical for her, and she's been having intermittent bouts of these symptoms for over 6 months. He also mentions that she has had specific left leaning tendencies previously in fact her wheelchair has a pillow to counter this leaning.  Regardless, the patient's  workup is negative. There is no evidence here of acute neurological deficit suggesting stroke.  No evidence of infectious etiology. The patient looks very well on exam.  Patient can be safely discharged home to followup as needed at her facility.   PT DC home stable.  Discussed with son the clinical impression, treatment in the ED, and follow up plan.  We alslo discussed the indications for returning to the ED, which include shortness or breath, confusion, fever, new weakness or numbness, chest pain, or any other concerning symptom.  The son understood the treatment and plan, is stable, and is able to leave the ED.         Larrie Kass, MD 04/17/12 [phone]

## 2012-04-17 NOTE — ED Notes (Signed)
Medicine given for c/o headache

## 2012-04-17 NOTE — ED Notes (Signed)
[facility] here to pick up pt.. 

## 2012-04-17 NOTE — ED Notes (Signed)
Son at the bedside, updated on plan on care.

## 2012-04-17 NOTE — Discharge Instructions (Signed)
Patient appears neurologically intact today. We found no evidence of infection in her urine. She has no symptoms of pneumonia.  The patient should see a doctor again if she has any new symptoms.  Return to the ED--with any new or troubling symptoms including fevers, weakness, new chest pain, shortness or breath, numbness, or any other concerning symptom.  Dementia    Dementia is a general term for problems with brain function. A person with dementia has memory loss and a hard time with at least one other brain function such as thinking, speaking, or problem solving. Dementia can affect social functioning, how you do your job, your mood, or your personality. The changes may be hidden for a long time. The earliest forms of this disease are usually not detected by family or friends. Dementia can be:  Irreversible.   Potentially reversible.   Partially reversible.   Progressive. This means it can get worse over time.  CAUSES  Irreversible dementia causes may include:  Degeneration of brain cells (Alzheimer's disease or lewy body dementia).   Multiple small strokes (vascular dementia).   Infection (chronic meningitis or Creutzfelt-Jakob disease).   Frontotemporal dementia. This affects younger people, age 20 to 44, compared to those who have Alzheimer's disease.   Dementia associated with other disorders like Parkinson's disease, Huntington's disease, or HIV-associated dementia.  Potentially or partially reversible dementia causes may include:  Medicines.   Metabolic causes such as excessive alcohol intake, vitamin B12 deficiency, or thyroid disease.   Masses or pressure in the brain such as a tumor, blood clot, or hydrocephalus.  SYMPTOMS  Symptoms are often hard to detect. Family members or coworkers may not notice them early in the disease process. Different people with dementia may have different symptoms. Symptoms can include:  A hard time with memory, especially recent memory.  Long-term memory may not be impaired.   Asking the same question multiple times or forgetting something someone just said.   A hard time speaking your thoughts or finding certain words.   A hard time solving problems or performing familiar tasks (such as how to use a telephone).   Sudden changes in mood.   Changes in personality, especially increasing moodiness or mistrust.   Depression.   A hard time understanding complex ideas that were never a problem in the past.  DIAGNOSIS  There are no specific tests for dementia.   Your caregiver may recommend a thorough evaluation. This is because some forms of dementia can be reversible. The evaluation will likely include a physical exam and getting a detailed history from you and a family member. The history often gives the best clues and suggestions for a diagnosis.   Memory testing may be done. A detailed brain function evaluation called neuropsychologic testing may be helpful.   Lab tests and brain imaging (such as a CT scan or MRI scan) are sometimes important.   Sometimes observation and re-evaluation over time is very helpful.  TREATMENT  Treatment depends on the cause.   If the problem is a vitamin deficiency, it may be helped or cured with supplements.   For dementias such as Alzheimer's disease, medicines are available to stabilize or slow the course of the disease. There are no cures for this type of dementia.   Your caregiver can help direct you to groups, organizations, and other caregivers to help with decisions in the care of you or your loved one.  HOME CARE INSTRUCTIONS The care of individuals with dementia is varied and  dependent upon the progression of the dementia. The following suggestions are intended for the person living with, or caring for, the person with dementia.  Create a safe environment.   Remove the locks on bathroom doors to prevent the person from accidentally locking himself or herself in.   Use  childproof latches on kitchen cabinets and any place where cleaning supplies, chemicals, or alcohol are kept.   Use childproof covers in unused electrical outlets.   Install childproof devices to keep doors and windows secured.   Remove stove knobs or install safety knobs and an automatic shut-off on the stove.   Lower the temperature on water heaters.   Label medicines and keep them locked up.   Secure knives, lighters, matches, power tools, and guns, and keep these items out of reach.   Keep the house free from clutter. Remove rugs or anything that might contribute to a fall.   Remove objects that might break and hurt the person.   Make sure lighting is good, both inside and outside.   Install grab rails as needed.   Use a monitoring device to alert you to falls or other needs for help.   Reduce confusion.   Keep familiar objects and people around.   Use night lights or dim lights at night.   Label items or areas.   Use reminders, notes, or directions for daily activities or tasks.   Keep a simple, consistent routine for waking, meals, bathing, dressing, and bedtime.   Create a calm, quiet environment.   Place large clocks and calendars prominently.   Display emergency numbers and home address near all telephones.   Use cues to establish different times of the day. An example is to open curtains to let the natural light in during the day.    Use effective communication.   Choose simple words and short sentences.   Use a gentle, calm tone of voice.   Be careful not to interrupt.   If the person is struggling to find a word or communicate a thought, try to provide the word or thought.   Ask one question at a time. Allow the person ample time to answer questions. Repeat the question again if the person does not respond.   Reduce nighttime restlessness.   Provide a comfortable bed.   Have a consistent nighttime routine.   Ensure a regular walking or  physical activity schedule. Involve the person in daily activities as much as possible.   Limit napping during the day.   Limit caffeine.   Attend social events that stimulate rather than overwhelm the senses.   Encourage good nutrition and hydration.   Reduce distractions during meal times and snacks.   Avoid foods that are too hot or too cold.   Monitor chewing and swallowing ability.   Continue with routine vision, hearing, dental, and medical screenings.   Only give over-the-counter or prescription medicines as directed by the caregiver.   Monitor driving abilities. Do not allow the person to drive when safe driving is no longer possible.   Register with an identification program which could provide location assistance in the event of a missing person situation.  SEEK MEDICAL CARE IF:   New behavioral problems start such as moodiness, aggressiveness, or seeing things that are not there (hallucinations).   Any new problem with brain function happens. This includes problems with balance, speech, or falling a lot.   Problems with swallowing develop.   Any symptoms of other illness happen.  Small changes or worsening in any aspect of brain function can be a sign that the illness is getting worse. It can also be a sign of another medical illness such as infection. Seeing a caregiver right away is important. SEEK IMMEDIATE MEDICAL CARE IF:   A fever develops.   New or worsened confusion develops.   New or worsened sleepiness develops.   Staying awake becomes hard to do.  Document Released: 04/01/2001 Document Revised: 09/25/2011 Document Reviewed: 03/03/2011 [facility] Patient Information 2012 [place], [place].

## 2012-04-17 NOTE — ED Notes (Signed)
Pt tolerated In and Out Cath without difficulty. Urine sent to lab for testing. Pt changed and re-positioned in bed. Vital signs stable. No signs of distress. Pt remains alert, but confused. Son at bedside.

## 2012-04-17 NOTE — ED Notes (Signed)
Pt presents to department via [facility] for evaluation of altered LOC. From [facility], per staff pt more confused than normal, also leaning to L side. Per EMS pt is alert, but confused. No slurred speech. No focal deficits noted. 20g L forearm. CBG 88.

## 2012-05-06 ENCOUNTER — Emergency Department (HOSPITAL_COMMUNITY)
Admission: EM | Admit: 2012-05-06 | Discharge: 2012-05-06 | Disposition: A | Discharge status: Skilled Nursing Facility | Payer: Medicare Other | Attending: Emergency Medicine | Admitting: Emergency Medicine

## 2012-05-06 ENCOUNTER — Encounter (HOSPITAL_COMMUNITY): Payer: Self-pay | Admitting: Emergency Medicine

## 2012-05-06 ENCOUNTER — Emergency Department (HOSPITAL_COMMUNITY): Payer: Medicare Other

## 2012-05-06 DIAGNOSIS — IMO0001 Reserved for inherently not codable concepts without codable children: Secondary | ICD-10-CM | POA: Insufficient documentation

## 2012-05-06 DIAGNOSIS — M899 Disorder of bone, unspecified: Secondary | ICD-10-CM | POA: Insufficient documentation

## 2012-05-06 DIAGNOSIS — E785 Hyperlipidemia, unspecified: Secondary | ICD-10-CM | POA: Insufficient documentation

## 2012-05-06 DIAGNOSIS — M1990 Unspecified osteoarthritis, unspecified site: Secondary | ICD-10-CM | POA: Insufficient documentation

## 2012-05-06 DIAGNOSIS — M949 Disorder of cartilage, unspecified: Secondary | ICD-10-CM | POA: Insufficient documentation

## 2012-05-06 DIAGNOSIS — W050XXA Fall from non-moving wheelchair, initial encounter: Secondary | ICD-10-CM

## 2012-05-06 DIAGNOSIS — F0390 Unspecified dementia without behavioral disturbance: Secondary | ICD-10-CM | POA: Insufficient documentation

## 2012-05-06 NOTE — ED Notes (Signed)
[facility] called to transport pt back to SNF. 

## 2012-05-06 NOTE — ED Provider Notes (Signed)
Medical screening examination/treatment/procedure(s) were conducted as a shared visit with non-physician practitioner(s) and myself.  I personally evaluated the patient during the encounter   Mechanical fall, imaging neg. Return to SNF.   Yoltzin Barg B. Bernette Mayers, MD 05/06/12 1032

## 2012-05-06 NOTE — ED Notes (Addendum)
Per EMS: pt from [place] c/o slid out of wheel chair this am onto buttocks; pt c/o pain to buttocks; no LOC; pt demented per baseline at present

## 2012-05-06 NOTE — ED Provider Notes (Signed)
History     CSN: [id number]  Arrival date & time 05/06/12  0841   First MD Initiated Contact with Patient 05/06/12 [phone]      Chief Complaint  Patient presents with  . Fall    (Consider location/radiation/quality/duration/timing/severity/associated sxs/prior treatment) HPI  71 year old female with history of dementia, chronic pain, and osteopenia presents to the ED by EMS for evaluation of fall. Per EMS, patient slid out of her wheel chair this morning and landed on her buttocks. History was limited due to pt baseline dementia.  Pt currently denies having any pain.  Pt is wheel chair bound.  Per nurse, staff at nursing home where pt stay specifically saw pt slipped out from wheelchair, and no LOC, or head trauma.    Past Medical History  Diagnosis Date  . Dementia   . Osteoarthritis   . Epigastric pain   . Chronic pain   . Hyperlipidemia   . Osteopenia   . 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 Norovirus   . Osteopenia     History reviewed. No pertinent past surgical history.  History reviewed. No pertinent family history.  History  Substance Use Topics  . Smoking status: Unknown If Ever Smoked  . Smokeless tobacco: Not on file  . Alcohol Use: No    OB History    Grav Para Term Preterm Abortions TAB SAB Ect Mult Living                  Review of Systems  Unable to perform ROS: Dementia    Allergies  Review of patient's allergies indicates no known allergies.  Home Medications   Current Outpatient Rx  Name Route Sig Dispense Refill  . ACETAMINOPHEN 325 MG PO TABS Oral Take 650 mg by mouth every 6 (six) hours as needed. For pain    . DIVALPROEX SODIUM ER 250 MG PO TB24 Oral Take 250 mg by mouth 2 (two) times daily.    . MELOXICAM 7.5 MG PO TABS Oral Take 7.5 mg by mouth every morning.     Marland Kitchen NITROFURANTOIN MONOHYD MACRO 100 MG PO CAPS Oral Take 100 mg by mouth 2 (two) times daily.    Marland Kitchen RISPERIDONE 0.5 MG PO TABS Oral Take 0.5 mg  by mouth at bedtime.     . SERTRALINE HCL 25 MG PO TABS Oral Take 25 mg by mouth every morning.       BP 132/63  Pulse 74  Temp 98 F (36.7 C) (Oral)  Resp 16  SpO2 100%  Physical Exam  Nursing note and vitals reviewed. Constitutional: She appears well-developed and well-nourished.       Thin-appearing  HENT:  Head: Normocephalic and atraumatic.  Eyes: Conjunctivae and EOM are normal. Pupils are equal, round, and reactive to light.  Neck: Normal range of motion. Neck supple.  Pulmonary/Chest: She exhibits no tenderness.  Abdominal: Soft. There is no tenderness.  Genitourinary:       Wearing adult diaper  Musculoskeletal:       Right hip: Normal.       Left hip: Normal.       Cervical back: Normal.       Thoracic back: Normal.       Lumbar back: Normal.  Neurological: She is alert.       Oriented to self only  Skin: Skin is warm. No rash noted.    ED Course  Procedures (including critical care time)  Labs Reviewed - No data to display No results found.   \ Dg Lumbar Spine Complete  05/06/2012  *RADIOLOGY REPORT*  Clinical Data: Fall with back pain and hip pain.  LUMBAR SPINE - COMPLETE 4+ VIEW  Comparison: 01/19/2012.  Findings: Alignment is anatomic.  Vertebral body height is maintained.  Endplate degenerative changes are worst at L5-S1. Multilevel facet hypertrophy.  Inferior vena cava filter is noted.  Atherosclerotic calcification of the arterial vasculature.  IMPRESSION: Multilevel spondylosis, worst at L5-S1.  No definite acute findings.  Original Report Authenticated By: Reyes Ivan, M.D.   Ct Head Wo Contrast  04/17/2012  *RADIOLOGY REPORT*  Clinical Data: Code stroke.  Leaning to the left side.  Confusion and disorientation.  Dementia.  CT HEAD WITHOUT CONTRAST  Technique:  Contiguous axial images were obtained from the base of the skull through the vertex without contrast.  Comparison: Head CT 03/24/2012.  Findings: Again noted is moderate cerebral and  mild cerebellar atrophy.  There is associated ex vacuo dilatation of the ventricular system (unchanged).  Patchy and confluent areas of decreased attenuation throughout the deep and periventricular white matter of the cerebral hemispheres bilaterally is similar to the prior examination and compatible with chronic microvascular ischemic changes.  No definite acute intracranial abnormality. Specifically, no definite signs of acute/subacute cerebral ischemia, no acute intraparenchymal hemorrhage, no focal mass, mass effect, hydrocephalus or abnormal intra or extra-axial fluid collections.  No acute displaced skull fractures are identified. Visualized paranasal sinuses and mastoids are well pneumatized.  IMPRESSION: 1.  No acute intracranial abnormalities. 2.  Cerebral and cerebellar atrophy with chronic microvascular ischemic changes in the cerebral white matter redemonstrated, as above.  Original Report Authenticated By: Florencia Reasons, M.D.    1. fall  MDM  Elderly female with history of osteopenia is wheel chair bound. EMS note states patient fell out of wheelchair this a.m. onto her buttocks.  On exam patient has no tenderness to her hip or buttock. No overlying skin changes.  Appears in NAD.  Plan to obtain lspine for further evaluation.  I do not think further work up needed at this time.    10:26 AM Xray of lspine unremarkable.  Discussed with my attending, who agrees pt stable to be d/c back to her nursing facility.  Pt currently in NAD, resting comfortably in room.        Fayrene Helper, PA-C 05/06/12 1028

## 2012-05-06 NOTE — ED Notes (Signed)
Report left on VM at [facility]

## 2012-06-01 ENCOUNTER — Encounter (HOSPITAL_COMMUNITY): Payer: Self-pay

## 2012-06-01 ENCOUNTER — Emergency Department (HOSPITAL_COMMUNITY)
Admission: EM | Admit: 2012-06-01 | Discharge: 2012-06-01 | Disposition: A | Discharge status: Home / Self Care | Payer: Medicare Other | Attending: Emergency Medicine | Admitting: Emergency Medicine

## 2012-06-01 ENCOUNTER — Emergency Department (HOSPITAL_COMMUNITY): Payer: Medicare Other

## 2012-06-01 DIAGNOSIS — W050XXA Fall from non-moving wheelchair, initial encounter: Secondary | ICD-10-CM | POA: Insufficient documentation

## 2012-06-01 DIAGNOSIS — M542 Cervicalgia: Secondary | ICD-10-CM

## 2012-06-01 DIAGNOSIS — M1990 Unspecified osteoarthritis, unspecified site: Secondary | ICD-10-CM | POA: Insufficient documentation

## 2012-06-01 DIAGNOSIS — Z79899 Other long term (current) drug therapy: Secondary | ICD-10-CM | POA: Insufficient documentation

## 2012-06-01 DIAGNOSIS — W19XXXA Unspecified fall, initial encounter: Secondary | ICD-10-CM

## 2012-06-01 DIAGNOSIS — F068 Other specified mental disorders due to known physiological condition: Secondary | ICD-10-CM | POA: Insufficient documentation

## 2012-06-01 DIAGNOSIS — E785 Hyperlipidemia, unspecified: Secondary | ICD-10-CM | POA: Insufficient documentation

## 2012-06-01 NOTE — ED Notes (Signed)
WUJ:[id number]<JY> Expected date:06/01/12<BR> Expected time: 1:50 PM<BR> Means of arrival:Ambulance<BR> Comments:<BR> ems

## 2012-06-01 NOTE — ED Notes (Signed)
Called [facility] and spoke with Aram Beecham rn about patient discharge and return to facility

## 2012-06-01 NOTE — ED Notes (Signed)
Pt. Put on monitor,vitals signs were take,

## 2012-06-01 NOTE — Discharge Instructions (Signed)
Return if worse, new symptoms, severe pain, change in mental status, vomiting, other concern.     Home Safety and Preventing Falls Falls are a leading cause of injury and while they affect all age groups, falls have greater short-term and long-term impact on older age groups. However, falls should not be a part of life or aging. It is possible for individuals and their families to use preventive measures to significantly decrease the likelihood that anyone, especially an older adult, will fall. There are many simple measures which can make your home safer with respect to preventing falls. The following actions can help reduce falls among all members of your family and are especially important as you age, when your balance, lower limb strength, coordination, and eyesight may be declining. The use of preventive measures will help to reduce you and your family's risk of falls and serious medical consequences. OUTDOORS  Repair cracks and edges of walkways and driveways.   Remove high doorway thresholds and trim shrubbery on the main path into your home.   Ensure there is good outside lighting at main entrances and along main walkways.   Clear walkways of tools, rocks, debris, and clutter.   Check that handrails are not broken and are securely fastened. Both sides of steps should have handrails.   In the garage, be attentive to and clean up grease or oil spills on the cement. This can make the surface extremely slippery.   In winter, have leaves, snow, and ice cleared regularly.   Use sand or salt on walkways during winter months.  BATHROOM  Install grab bars by the toilet and in the tub and shower.   Use non-skid mats or decals in the tub or shower.   If unable to easily stand unsupported while showering, place a plastic non slip stool in the shower to sit on when needed.   Install night lights.   Keep floors dry and clean up all water on the floor immediately.   Remove soap buildup  in tub or shower on a regular basis.   Secure bath mats with non-slip, double-sided rug tape.   Remove tripping hazards from the floors.  BEDROOMS  Install night lights.   Do not use oversized bedding.   Make sure a bedside light is easy to reach.   Keep a telephone by your bedside.   Make sure that you can get in and out of your bed easily.   Have a firm chair, with side arms, to use for getting dressed.   Remove clutter from around closets.   Store clothing, bed coverings, and other household items where you can reach them comfortably.   Remove tripping hazards from the floor.  LIVING AREAS AND STAIRWAYS  Turn on lights to avoid having to walk through dark areas.   Keep lighting uniform in each room. Place brighter lightbulbs in darker areas, including stairways.   Replace lightbulbs that burn out in stairways immediately.   Arrange furniture to provide for clear pathways.   Keep furniture in the same place.   Eliminate or tape down electrical cables in high traffic areas.   Place handrails on both sides of stairways. Use handrails when going up or down stairs.   Most falls occur on the top or bottom 3 steps.   Fix any loose handrails. Make sure handrails on both sides of the stairways are as long as the stairs.   Remove all walkway obstacles.   Coil or tape electrical cords off to  the side of walking areas and out of the way. If using many extension cords, have an electrician put in a new wall outlet to reduce or eliminate them.   Make sure spills are cleaned up quickly and allow time for drying before walking on freshly cleaned floors.   Firmly attach carpet with non-skid or two-sided tape.   Keep frequently used items within easy reach.   Remove tripping hazards such as throw rugs and clutter in walkways. Never leave objects on stairs.   Get rid of throw rugs elsewhere if possible.   Eliminate uneven floor surfaces.   Make sure couches and chairs are  easy to get into and out of.   Check carpeting to make sure it is firmly attached along stairs.   Make repairs to worn or loose carpet promptly.   Select a carpet pattern that does not visually hide the edge of steps.   Avoid placing throw rugs or scatter rugs at the top or bottom of stairways, or properly secure with carpet tape to prevent slippage.   Have an electrician put in a light switch at the top and bottom of the stairs.   Get light switches that glow.   Avoid the following practices: hurrying, inattention, obscured vision, carrying large loads, and wearing slip-on shoes.   Be aware of all pets.  KITCHEN  Place items that are used frequently, such as dishes and food, within easy reach.   Keep handles on pots and pans toward the center of the stove. Use back burners when possible.   Make sure spills are cleaned up quickly and allow time for drying.   Avoid walking on wet floors.   Avoid hot utensils and knives.   Position shelves so they are not too high or low.   Place commonly used objects within easy reach.   If necessary, use a sturdy step stool with a grab bar when reaching.   Make sure electrical cables are out of the way.   Do not use floor polish or wax that makes floors slippery.  OTHER HOME FALL PREVENTION STRATEGIES  Wear low heel or rubber sole shoes that are supportive and fit well.   Wear closed toe shoes.   Know and watch for side effects of medications. Have your caregiver or pharmacist look at all your medicines, even over-the-counter medicines. Some medicines can make you sleepy or dizzy.   Exercise regularly. Exercise makes you stronger and improves your balance and coordination.   Limit use of alcohol.   Use eyeglasses if necessary and keep them clean. Have your vision checked every year.   Organize your household in a manner that minimizes the need to walk distances when hurried, or go up and down stairs unnecessarily. For example, have  a phone placed on at least each floor of your home. If possible, have a phone beside each sitting or lying area where you spend the most time at home. Keep emergency numbers posted at all phones.   Use non-skid floor wax.   When using a ladder, make sure:   The base is firm.   All ladder feet are on level ground.   The ladder is angled against the wall properly.   When climbing a ladder, face the ladder and hold the ladder rungs firmly.   If reaching, always keep your hips and body weight centered between the rails.   When using a stepladder, make sure it is fully opened and both spreaders are firmly locked.  Do not climb a closed stepladder.   Avoid climbing beyond the second step from the top of a stepladder and the 4th rung from the top of an extension ladder.   Learn and use mobility aids as needed.   Change positions slowly. Arise slowly from sitting and lying positions. Sit on the edge of your bed before getting to your feet.   If you have a history of falls, ask someone to add color or contrast paint or tape to grab bars and handrails in your home.   If you have a history of falls, ask someone to place contrasting color strips on first and last steps.   Install an electrical emergency response system if you need one, and know how to use it.   If you have a medical or other condition that causes you to have limited physical strength, it is important that you reach out to family and friends for occasional help.  FOR CHILDREN:  If young children are in the home, use safety gates. At the top of stairs use screw-mounted gates; use pressure-mounted gates for the bottom of the stairs and doorways between rooms.   Young children should be taught to descend stairs on their stomachs, feet first, and later using the handrail.   Keep drawers fully closed to prevent them from being climbed on or pulled out entirely.   Move chairs, cribs, beds and other furniture away from windows.    Consider installing window guards on windows ground floor and up, unless they are emergency fire exits. Make sure they have easy release mechanisms.   Consider installing special locks that only allow the window to be opened to a certain height.   Never rely on window screens to prevent falls.   Never leave babies alone on changing tables, beds or sofas. Use a changing table that has a restraining strap.   When a child can pull to a standing position, the crib mattress should be adjusted to its lowest position. There should be at least 26 inches between the top rails of the crib drop side and the mattress. Toys, bumper pads, and other objects that can be used as steps to climb out should be removed from the crib.   On bunk beds never allow a child under age 71 to sleep on the top bunk. For older children, if the upper bunk is not against a wall, use guard rails on both sides. No matter how old a child is, keep the guard rails in place on the top bunk since children roll during sleep. Do not permit horseplay on bunks.   Grass and soil surfaces beneath backyard playground equipment should be replaced with hardwood chips, shredded wood mulch, sand, pea gravel, rubber, crushed stone, or another safer material at depths of at least 9 to 12 inches.   When riding bikes or using skates, skateboards, skis, or snowboards, require children to wear helmets. Look for those that have stickers stating that they meet or exceed safety standards.   Vertical posts or pickets in deck, balcony, and stairway railings should be no more than 3 1/2 inches apart if a young baby will have access to the area. The space between horizontal rails or bars, and between the floor and the first horizontal rail or bar, should be no more than 3 1/2 inches.  Document Released: 09/26/2002 Document Revised: 09/25/2011 Document Reviewed: 07/26/2009 [facility] Patient Information 2012 [place], [place].

## 2012-06-01 NOTE — ED Notes (Signed)
Pt fell. Was in wheelchair, reached for an object and fell forward. No LOC. Denied injury, emeritus send pt here for evaluation due to policy. Pt has dementia. Poor historian.

## 2012-06-01 NOTE — ED Provider Notes (Signed)
History     CSN: [id number]  Arrival date & time 06/01/12  1356   First MD Initiated Contact with Patient 06/01/12 1414      Chief Complaint  Patient presents with  . Fall    (Consider location/radiation/quality/duration/timing/severity/associated sxs/prior treatment) Patient is a 71 y.o. female presenting with fall. The history is provided by the patient. The history is limited by the condition of the patient (level 5 caveat).  Fall  pt with hx dementia, from [facility], was in wheelchair, reached forward for object, fell forward. No loc. C/o mid to upper neck pain. No radiation. No headache. No vomiting. Pt denies other pain/injury. Level 5 caveat - dementia.     Past Medical History  Diagnosis Date  . Dementia   . Osteoarthritis   . Epigastric pain   . Chronic pain   . Hyperlipidemia   . Osteopenia   . 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 Norovirus   . Osteopenia     History reviewed. No pertinent past surgical history.  No family history on file.  History  Substance Use Topics  . Smoking status: Unknown If Ever Smoked  . Smokeless tobacco: Not on file  . Alcohol Use: No    OB History    Grav Para Term Preterm Abortions TAB SAB Ect Mult Living                  Review of Systems  Unable to perform ROS: Dementia  level 5 caveat  Allergies  Review of patient's allergies indicates no known allergies.  Home Medications   Current Outpatient Rx  Name Route Sig Dispense Refill  . DIVALPROEX SODIUM ER 250 MG PO TB24 Oral Take 250 mg by mouth 2 (two) times daily.    . MELOXICAM 7.5 MG PO TABS Oral Take 7.5 mg by mouth every morning.     Marland Kitchen RISPERIDONE 0.5 MG PO TABS Oral Take 0.5 mg by mouth 2 (two) times daily.     . SERTRALINE HCL 25 MG PO TABS Oral Take 25 mg by mouth every morning.     . ACETAMINOPHEN 325 MG PO TABS Oral Take 650 mg by mouth every 6 (six) hours as needed. For pain      BP 124/76  Pulse 77  Temp 98.7 F (37.1  C) (Oral)  Resp 17  SpO2 100%  Physical Exam  Nursing note and vitals reviewed. Constitutional: She appears well-developed and well-nourished. No distress.  HENT:  Head: Atraumatic.  Eyes: Conjunctivae are normal. Pupils are equal, round, and reactive to light. No scleral icterus.  Neck: Neck supple. No tracheal deviation present.  Cardiovascular: Normal rate, regular rhythm, normal heart sounds and intact distal pulses.   Pulmonary/Chest: Effort normal and breath sounds normal. No respiratory distress. She exhibits no tenderness.  Abdominal: Soft. Normal appearance and bowel sounds are normal. She exhibits no distension. There is no tenderness.  Musculoskeletal: Normal range of motion. She exhibits no edema and no tenderness.       Good rom bil extremities without pain or focal bony tenderness  Neurological: She is alert.       Awake and alert. Smiling, content. Confused/dementia - ms described c/w pts baseline. Moves bil extremities purposefully.   Skin: Skin is warm and dry. No rash noted.    ED Course  Procedures (including critical care time)   Ct Cervical Spine Wo Contrast  06/01/2012  *RADIOLOGY REPORT*  Clinical Data: Fall.  Neck pain  CT CERVICAL SPINE WITHOUT CONTRAST  Technique:  Multidetector CT imaging of the cervical spine was performed. Multiplanar CT image reconstructions were also generated.  Comparison: None.  Findings: Negative for acute fracture.  2 mm anterior slip C5-6.  Moderate disc degeneration and spurring at C5-6 and C6-7.  Advanced facet degeneration.  Degenerative changes C1-C2.  Scoliosis is present.  IMPRESSION: Negative for fracture.  Original Report Authenticated By: Camelia Phenes, M.D.     MDM  Ct.   Date: 06/01/2012  Rate: 77  Rhythm: normal sinus rhythm  QRS Axis: normal  Intervals: normal  ST/T Wave abnormalities: normal  Conduction Disutrbances:none  Narrative Interpretation:   Old EKG Reviewed: unchanged   Recheck pt comfortable.        Suzi Roots, MD 06/01/12 [phone]

## 2012-06-25 ENCOUNTER — Emergency Department (HOSPITAL_COMMUNITY): Payer: Medicare Other

## 2012-06-25 ENCOUNTER — Encounter (HOSPITAL_COMMUNITY): Payer: Self-pay | Admitting: *Deleted

## 2012-06-25 ENCOUNTER — Emergency Department (HOSPITAL_COMMUNITY)
Admission: EM | Admit: 2012-06-25 | Discharge: 2012-06-25 | Disposition: A | Discharge status: Home / Self Care | Payer: Medicare Other | Attending: Emergency Medicine | Admitting: Emergency Medicine

## 2012-06-25 DIAGNOSIS — M7989 Other specified soft tissue disorders: Secondary | ICD-10-CM | POA: Insufficient documentation

## 2012-06-25 DIAGNOSIS — F0390 Unspecified dementia without behavioral disturbance: Secondary | ICD-10-CM | POA: Insufficient documentation

## 2012-06-25 DIAGNOSIS — M79609 Pain in unspecified limb: Secondary | ICD-10-CM

## 2012-06-25 DIAGNOSIS — W050XXA Fall from non-moving wheelchair, initial encounter: Secondary | ICD-10-CM | POA: Insufficient documentation

## 2012-06-25 DIAGNOSIS — Y92129 Unspecified place in nursing home as the place of occurrence of the external cause: Secondary | ICD-10-CM

## 2012-06-25 DIAGNOSIS — S0990XA Unspecified injury of head, initial encounter: Secondary | ICD-10-CM | POA: Insufficient documentation

## 2012-06-25 DIAGNOSIS — M542 Cervicalgia: Secondary | ICD-10-CM | POA: Insufficient documentation

## 2012-06-25 NOTE — ED Notes (Signed)
[place] called for transport to [facility]

## 2012-06-25 NOTE — Progress Notes (Signed)
*  Preliminary Results*  Left lower extremity venous duplex completed. Extremely technically limited study due to patient cooperation/combativeness and suboptimal pt positioning. No obvious evidence of left lower extremity deep vein thrombosis. Preliminary results discussed with RN Marcelle Smiling.  06/25/2012 6:55 PM Gertie Fey, RDMS, RDCS

## 2012-06-25 NOTE — ED Notes (Signed)
Pt comes in from [facility] facility; pt slipped out of her wheelchair and fell to the floor. Fall was unwitnessed; pt c/o neck and back pain.

## 2012-06-25 NOTE — ED Provider Notes (Addendum)
History     CSN: [id number]  Arrival date & time 06/25/12  1658   First MD Initiated Contact with Patient 06/25/12 1736      Chief Complaint  Patient presents with  . Fall    (Consider location/radiation/quality/duration/timing/severity/associated sxs/prior treatment) HPI Comments: LEVEL 5 CAVEAT FOR SEVERE DEMENTIA Pt with dementia comes from the nursing home after a mechanical fall from the wheel chair. Pt has no complains. Has hx of fall per son who is at the bedside.   Patient is a 71 y.o. female presenting with fall. The history is provided by a relative.  Fall    Past Medical History  Diagnosis Date  . Dementia   . Osteoarthritis   . Epigastric pain   . Chronic pain   . Hyperlipidemia   . Osteopenia   . 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 Norovirus   . Osteopenia     History reviewed. No pertinent past surgical history.  History reviewed. No pertinent family history.  History  Substance Use Topics  . Smoking status: Unknown If Ever Smoked  . Smokeless tobacco: Not on file  . Alcohol Use: No    OB History    Grav Para Term Preterm Abortions TAB SAB Ect Mult Living                  Review of Systems  Unable to perform ROS: Dementia    Allergies  Review of patient's allergies indicates no known allergies.  Home Medications   Current Outpatient Rx  Name Route Sig Dispense Refill  . ACETAMINOPHEN 325 MG PO TABS Oral Take 650 mg by mouth every 6 (six) hours as needed. For pain    . CRANBERRY 500 MG PO CAPS Oral Take 1 capsule by mouth daily.    Marland Kitchen DIVALPROEX SODIUM 125 MG PO CPSP Oral Take 375 mg by mouth 2 (two) times daily. Take three capsules (375 mg total) twice a day    . MELOXICAM 7.5 MG PO TABS Oral Take 7.5 mg by mouth every morning.     Marland Kitchen NITROFURANTOIN MACROCRYSTAL 50 MG PO CAPS Oral Take 50 mg by mouth daily.    Marland Kitchen RISPERIDONE 0.5 MG PO TABS Oral Take 0.5 mg by mouth 2 (two) times daily. Take in the morning  and in the evening    . SERTRALINE HCL 25 MG PO TABS Oral Take 25 mg by mouth daily.       BP 124/58  Pulse 79  Temp 98.6 F (37 C) (Oral)  Resp 24  SpO2 98%  Physical Exam  Nursing note and vitals reviewed. Constitutional: She appears well-developed.  Eyes: Conjunctivae normal are normal. Pupils are equal, round, and reactive to light.  Cardiovascular: Normal rate and regular rhythm.   Pulmonary/Chest: No respiratory distress.  Abdominal: There is no rebound and no guarding.  Skin: Skin is warm and dry.    ED Course  Procedures (including critical care time)  Labs Reviewed - No data to display Ct Head Wo Contrast  06/25/2012  *RADIOLOGY REPORT*  Clinical Data:  Fall.  Head injury.  Neck pain.  CT HEAD WITHOUT CONTRAST CT CERVICAL SPINE WITHOUT CONTRAST  Technique:  Multidetector CT imaging of the head and cervical spine was performed following the standard protocol without intravenous contrast.  Multiplanar CT image reconstructions of the cervical spine were also generated.  Comparison:  06/01/2012 and 04/17/2012  CT HEAD  Findings: There is no evidence  of intracranial hemorrhage, brain edema or other signs of acute infarction.  There is no evidence of intracranial mass lesion or mass effect.  No abnormal extra-axial fluid collections are identified.  Central pattern of cerebral atrophy and associated ventriculomegaly remains stable.  Mild to moderate chronic small vessel disease also stable in appearance.  No skull abnormality identified.  IMPRESSION:  1.  No acute intracranial abnormality. 2.  Stable cerebral atrophy and chronic small vessel disease.  CT CERVICAL SPINE  Findings: No evidence of cervical spine fracture or subluxation.  Degenerative disc disease is again seen which is most pronounced at C6-7.  Moderate bilateral facet DJD is also seen at most cervical levels with moderate to severe atlantoaxial degenerative changes. These findings show no change compared to prior exam.   IMPRESSION:  1.  No evidence of acute cervical spine fracture or subluxation. 2.  Stable degenerative cervical spondylosis, as described above.   Original Report Authenticated By: Danae Orleans, M.D.    Ct Cervical Spine Wo Contrast  06/25/2012  *RADIOLOGY REPORT*  Clinical Data:  Fall.  Head injury.  Neck pain.  CT HEAD WITHOUT CONTRAST CT CERVICAL SPINE WITHOUT CONTRAST  Technique:  Multidetector CT imaging of the head and cervical spine was performed following the standard protocol without intravenous contrast.  Multiplanar CT image reconstructions of the cervical spine were also generated.  Comparison:  06/01/2012 and 04/17/2012  CT HEAD  Findings: There is no evidence of intracranial hemorrhage, brain edema or other signs of acute infarction.  There is no evidence of intracranial mass lesion or mass effect.  No abnormal extra-axial fluid collections are identified.  Central pattern of cerebral atrophy and associated ventriculomegaly remains stable.  Mild to moderate chronic small vessel disease also stable in appearance.  No skull abnormality identified.  IMPRESSION:  1.  No acute intracranial abnormality. 2.  Stable cerebral atrophy and chronic small vessel disease.  CT CERVICAL SPINE  Findings: No evidence of cervical spine fracture or subluxation.  Degenerative disc disease is again seen which is most pronounced at C6-7.  Moderate bilateral facet DJD is also seen at most cervical levels with moderate to severe atlantoaxial degenerative changes. These findings show no change compared to prior exam.  IMPRESSION:  1.  No evidence of acute cervical spine fracture or subluxation. 2.  Stable degenerative cervical spondylosis, as described above.   Original Report Authenticated By: Danae Orleans, M.D.      No diagnosis found.    MDM  DDx includes: - Mechanical falls - ICH - Fractures - Contusions - Soft tissue injury  Pt comes in post witnessed mechanical fall. Will get CT head, Cpsine. Also has  LLE unialteral swelling, and she is not very mobile - so we will get DVT r/o.  Derwood Kaplan, MD 06/26/12 [id number]  Derwood Kaplan, MD 08/14/12 [id number]

## 2012-07-17 ENCOUNTER — Encounter (HOSPITAL_COMMUNITY): Payer: Self-pay | Admitting: Emergency Medicine

## 2012-07-17 ENCOUNTER — Emergency Department (HOSPITAL_COMMUNITY): Payer: Medicare Other

## 2012-07-17 ENCOUNTER — Inpatient Hospital Stay (HOSPITAL_COMMUNITY)
Admission: EM | Admit: 2012-07-17 | Discharge: 2012-07-19 | DRG: 534 | Disposition: A | Discharge status: Home / Self Care | Payer: Medicare Other | Attending: Internal Medicine | Admitting: Internal Medicine

## 2012-07-17 DIAGNOSIS — Z993 Dependence on wheelchair: Secondary | ICD-10-CM

## 2012-07-17 DIAGNOSIS — M899 Disorder of bone, unspecified: Secondary | ICD-10-CM | POA: Diagnosis present

## 2012-07-17 DIAGNOSIS — F72 Severe intellectual disabilities: Secondary | ICD-10-CM | POA: Diagnosis present

## 2012-07-17 DIAGNOSIS — S7290XA Unspecified fracture of unspecified femur, initial encounter for closed fracture: Principal | ICD-10-CM | POA: Diagnosis present

## 2012-07-17 DIAGNOSIS — Y92129 Unspecified place in nursing home as the place of occurrence of the external cause: Secondary | ICD-10-CM

## 2012-07-17 DIAGNOSIS — W19XXXA Unspecified fall, initial encounter: Secondary | ICD-10-CM | POA: Diagnosis present

## 2012-07-17 DIAGNOSIS — S72009A Fracture of unspecified part of neck of unspecified femur, initial encounter for closed fracture: Secondary | ICD-10-CM

## 2012-07-17 DIAGNOSIS — G8929 Other chronic pain: Secondary | ICD-10-CM | POA: Diagnosis present

## 2012-07-17 DIAGNOSIS — M1990 Unspecified osteoarthritis, unspecified site: Secondary | ICD-10-CM | POA: Diagnosis present

## 2012-07-17 DIAGNOSIS — F0280 Dementia in other diseases classified elsewhere without behavioral disturbance: Secondary | ICD-10-CM | POA: Diagnosis present

## 2012-07-17 DIAGNOSIS — F0390 Unspecified dementia without behavioral disturbance: Secondary | ICD-10-CM | POA: Diagnosis present

## 2012-07-17 DIAGNOSIS — W050XXA Fall from non-moving wheelchair, initial encounter: Secondary | ICD-10-CM | POA: Diagnosis present

## 2012-07-17 DIAGNOSIS — N390 Urinary tract infection, site not specified: Secondary | ICD-10-CM | POA: Diagnosis present

## 2012-07-17 LAB — CBC WITH DIFFERENTIAL/PLATELET
Eosinophils Absolute: 0.1 10*3/uL (ref 0.0–0.7)
Hemoglobin: 11.6 g/dL — ABNORMAL LOW (ref 12.0–15.0)
Lymphocytes Relative: 24 % (ref 12–46)
Lymphs Abs: 1.4 10*3/uL (ref 0.7–4.0)
MCH: 30.5 pg (ref 26.0–34.0)
Monocytes Relative: 10 % (ref 3–12)
Neutro Abs: 3.8 10*3/uL (ref 1.7–7.7)
Neutrophils Relative %: 65 % (ref 43–77)
Platelets: 161 10*3/uL (ref 150–400)
RBC: 3.8 MIL/uL — ABNORMAL LOW (ref 3.87–5.11)
WBC: 5.9 10*3/uL (ref 4.0–10.5)

## 2012-07-17 LAB — BASIC METABOLIC PANEL
Calcium: 8.6 mg/dL (ref 8.4–10.5)
GFR calc non Af Amer: 87 mL/min — ABNORMAL LOW (ref >90)
Glucose, Bld: 99 mg/dL (ref 70–99)
Potassium: 3.9 mEq/L (ref 3.5–5.1)
Sodium: 136 mEq/L (ref 135–145)

## 2012-07-17 LAB — PROTIME-INR
INR: 1.02 (ref 0.00–1.49)
Prothrombin Time: 13.3 seconds (ref 11.6–15.2)

## 2012-07-17 LAB — GLUCOSE, CAPILLARY

## 2012-07-17 MED ORDER — PREGABALIN 150 MG PO CAPS: 150.0000 mg | ORAL_CAPSULE | Freq: Two times a day (BID) | ORAL | Status: DC

## 2012-07-17 MED ORDER — MORPHINE SULFATE 2 MG/ML IJ SOLN
2.0000 mg | INTRAMUSCULAR | Status: DC | PRN
  Administered 2012-07-17 – 2012-07-18 (×2): 2 mg via INTRAVENOUS
  Filled 2012-07-17 (×2): qty 1

## 2012-07-17 MED ORDER — PROMETHAZINE HCL 25 MG PO TABS: 25.0000 mg | ORAL_TABLET | Freq: Four times a day (QID) | ORAL | Status: DC | PRN

## 2012-07-17 NOTE — ED Notes (Signed)
Pt with hx of dementia and alzhiemers experienced fall at nursing home earlier today. Patient sts pain in her left leg, hematoma to left forehead, and skin tear to left hand. Pt sts no other pain anywhere. Patients sons sts she has been refusing to stand all day.

## 2012-07-17 NOTE — ED Notes (Signed)
Wound on patients right hand cleaned and bandaged.

## 2012-07-17 NOTE — ED Notes (Signed)
OZH:[id number]<HQ> Expected date:07/17/12<BR> Expected time:<BR> Means of arrival:Ambulance<BR> Comments:<BR> Elderly fall

## 2012-07-17 NOTE — ED Provider Notes (Signed)
History     CSN: [id number]  Arrival date & time 07/17/12  1743   First MD Initiated Contact with Patient 07/17/12 1752      Chief Complaint  Patient presents with  . Fall     HPI Per ems. Pt from [facility]. Pt had fall in lobby.  No LOC. Staff stated pt leaned too far forward in chair and fell onto face. Pt complained of bilateral knee and butt pain. Staff report no loc. Pt has hx of dementia and staff report that pt is mentally at norm. Pt has chronic pain.  According to her son she is at her baseline from a mental status standpoint  Past Medical History  Diagnosis Date  . Dementia   . Osteoarthritis   . Epigastric pain   . Chronic pain   . Hyperlipidemia   . 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 Norovirus   . Osteopenia     History reviewed. No pertinent past surgical history.  Family History  Problem Relation Age of Onset  . Diabetes type II    . Multiple sclerosis    . Dementia      History  Substance Use Topics  . Smoking status: Former Games developer  . Smokeless tobacco: Not on file  . Alcohol Use: No    OB History    Grav Para Term Preterm Abortions TAB SAB Ect Mult Living                  Review of Systems  Unable to perform ROS  level V caveat because of dementia  Allergies  Review of patient's allergies indicates no known allergies.  Home Medications   Current Outpatient Rx  Name Route Sig Dispense Refill  . PREGABALIN 150 MG PO CAPS Oral Take 1 capsule (150 mg total) by mouth 2 (two) times daily. 15 capsule 0  . PROMETHAZINE HCL 25 MG PO TABS Oral Take 1 tablet (25 mg total) by mouth every 6 (six) hours as needed for nausea. 12 tablet 0    BP 116/66  Pulse 93  Temp 97.7 F (36.5 C) (Oral)  Resp 20  Ht 5\' 2"  (1.575 m)  Wt 137 lb 2 oz (62.2 kg)  BMI 25.08 kg/m2  SpO2 99%  Physical Exam  Nursing note and vitals reviewed. Constitutional: She is oriented to person, place, and time. She appears well-developed.  She is uncooperative. No distress.  HENT:  Head: Normocephalic and atraumatic.    Eyes: Pupils are equal, round, and reactive to light.  Neck: Normal range of motion. No spinous process tenderness and no muscular tenderness present.  Cardiovascular: Normal rate and intact distal pulses.   Pulmonary/Chest: No respiratory distress.    Abdominal: Normal appearance. She exhibits no distension.  Musculoskeletal:       Left hip: She exhibits tenderness and bony tenderness.       Hands: Neurological: She is alert and oriented to person, place, and time. No cranial nerve deficit.  Skin: Skin is warm and dry. No rash noted.  Psychiatric: She is agitated.    ED Course  Procedures (including critical care time)  Labs Reviewed  GLUCOSE, CAPILLARY - Abnormal; Notable for the following:    Glucose-Capillary 106 (*)     All other components within normal limits  BASIC METABOLIC PANEL - Abnormal; Notable for the following:    BUN 24 (*)     GFR calc non Af Amer 87 (*)  All other components within normal limits  CBC WITH DIFFERENTIAL - Abnormal; Notable for the following:    RBC 3.80 (*)     Hemoglobin 11.6 (*)     HCT 34.1 (*)     All other components within normal limits  BASIC METABOLIC PANEL - Abnormal; Notable for the following:    GFR calc non Af Amer 88 (*)     All other components within normal limits  PROTIME-INR  CBC  PROTIME-INR  MRSA PCR SCREENING   Dg Hip Complete Left  07/17/2012  *RADIOLOGY REPORT*  Clinical Data: Left hip and knee pain  LEFT HIP - COMPLETE 2+ VIEW  Comparison: The pelvis film 11/27/2011  Findings: There is a fracture of the left femoral neck which appears to be sub capital.  Hip is located.  IMPRESSION: Left femoral neck fracture.   Original Report Authenticated By: Genevive Bi, M.D.    Dg Knee 1-2 Views Left  07/17/2012  *RADIOLOGY REPORT*  Clinical Data: Knee pain, left hip fracture  LEFT KNEE - 1-2 VIEW  Comparison: Pelvis films same day   Findings: No evidence of fracture of the distal femur.  Knee joint is intact.  IMPRESSION: No distal femur fracture.   Original Report Authenticated By: Genevive Bi, M.D.    Dg Chest Portable 1 View  07/17/2012  *RADIOLOGY REPORT*  Clinical Data: Fall and preop evaluation.  PORTABLE CHEST - 1 VIEW  Comparison: 04/17/2012  Findings: Single view of the chest was obtained.  No evidence for a pneumothorax.  Previous examination demonstrated nodular densities in the right lung that are not clearly identified on this examination.  Those nodular densities probably represented an infectious or inflammatory process.  The lungs are clear with chronic coarse lung markings.  Heart and mediastinum are within normal limits.  Trachea is midline.  IMPRESSION: No acute chest findings.   Original Report Authenticated By: Richarda Overlie, M.D.      1. Hip fracture   2. Dementia       MDM          Nelia Shi, MD 07/18/12 1040

## 2012-07-17 NOTE — ED Notes (Signed)
Pt also has abrasion to back of L hand.

## 2012-07-17 NOTE — ED Notes (Signed)
Pt report called to michael.

## 2012-07-17 NOTE — ED Notes (Signed)
Per ems.  Pt from [facility].  Pt had fall in lobby.  Staff stated pt leaned too far forward in chair and fell onto face.  Pt complained of bilateral knee and butt pain.  Staff report no loc.  Pt has hx of dementia and staff report that pt is mentally at norm.  Pt has chronic pain

## 2012-07-17 NOTE — ED Notes (Signed)
Family states pt has refused to get up all day.

## 2012-07-18 ENCOUNTER — Inpatient Hospital Stay (HOSPITAL_COMMUNITY): Payer: Medicare Other

## 2012-07-18 ENCOUNTER — Encounter (HOSPITAL_COMMUNITY): Payer: Self-pay | Admitting: Family Medicine

## 2012-07-18 LAB — CBC
HCT: 38.8 % (ref 36.0–46.0)
Hemoglobin: 12.9 g/dL (ref 12.0–15.0)
MCH: 29.9 pg (ref 26.0–34.0)
MCHC: 33.2 g/dL (ref 30.0–36.0)
MCV: 90 fL (ref 78.0–100.0)
RDW: 13.3 % (ref 11.5–15.5)

## 2012-07-18 LAB — PROTIME-INR: INR: 1.02 (ref 0.00–1.49)

## 2012-07-18 LAB — URINALYSIS, ROUTINE W REFLEX MICROSCOPIC
Glucose, UA: NEGATIVE mg/dL
Hgb urine dipstick: NEGATIVE
Leukocytes, UA: NEGATIVE
Specific Gravity, Urine: 1.011 (ref 1.005–1.030)
pH: 6 (ref 5.0–8.0)

## 2012-07-18 LAB — URINE MICROSCOPIC-ADD ON

## 2012-07-18 LAB — BASIC METABOLIC PANEL
BUN: 17 mg/dL (ref 6–23)
CO2: 26 mEq/L (ref 19–32)
Chloride: 101 mEq/L (ref 96–112)
Creatinine, Ser: 0.64 mg/dL (ref 0.50–1.10)
Glucose, Bld: 97 mg/dL (ref 70–99)
Potassium: 4 mEq/L (ref 3.5–5.1)

## 2012-07-18 MED ORDER — SENNOSIDES-DOCUSATE SODIUM 8.6-50 MG PO TABS
1.0000 | ORAL_TABLET | Freq: Every evening | ORAL | Status: DC | PRN
  Filled 2012-07-18: qty 1

## 2012-07-18 MED ORDER — RISPERIDONE 0.5 MG PO TABS
0.5000 mg | ORAL_TABLET | Freq: Every day | ORAL | Status: DC
  Administered 2012-07-18: 0.5 mg via ORAL
  Filled 2012-07-18 (×3): qty 1

## 2012-07-18 MED ORDER — ENOXAPARIN SODIUM 40 MG/0.4ML ~~LOC~~ SOLN
40.0000 mg | SUBCUTANEOUS | Status: DC
  Filled 2012-07-18: qty 0.4

## 2012-07-18 MED ORDER — RISPERIDONE 0.25 MG PO TABS: 0.2500 mg | ORAL_TABLET | Freq: Two times a day (BID) | ORAL | Status: DC

## 2012-07-18 MED ORDER — SERTRALINE HCL 25 MG PO TABS
25.0000 mg | ORAL_TABLET | Freq: Every day | ORAL | Status: DC
  Administered 2012-07-18 – 2012-07-19 (×2): 25 mg via ORAL
  Filled 2012-07-18 (×2): qty 1

## 2012-07-18 MED ORDER — DIVALPROEX SODIUM 125 MG PO CPSP
375.0000 mg | ORAL_CAPSULE | Freq: Two times a day (BID) | ORAL | Status: DC
  Administered 2012-07-18 – 2012-07-19 (×3): 375 mg via ORAL
  Filled 2012-07-18 (×5): qty 3

## 2012-07-18 MED ORDER — HYDROCODONE-ACETAMINOPHEN 5-325 MG PO TABS
1.0000 | ORAL_TABLET | ORAL | Status: DC | PRN
  Administered 2012-07-19: 1 via ORAL
  Filled 2012-07-18: qty 1

## 2012-07-18 MED ORDER — POTASSIUM CHLORIDE IN NACL 20-0.9 MEQ/L-% IV SOLN
INTRAVENOUS | Status: DC
  Administered 2012-07-18: 1000 mL via INTRAVENOUS
  Administered 2012-07-18: 02:00:00 via INTRAVENOUS
  Filled 2012-07-18 (×4): qty 1000

## 2012-07-18 MED ORDER — INFLUENZA VIRUS VACC SPLIT PF IM SUSP
0.5000 mL | INTRAMUSCULAR | Status: AC
  Administered 2012-07-19: 0.5 mL via INTRAMUSCULAR
  Filled 2012-07-18 (×2): qty 0.5

## 2012-07-18 MED ORDER — ACETAMINOPHEN 325 MG PO TABS: 650.0000 mg | ORAL_TABLET | Freq: Four times a day (QID) | ORAL | Status: DC | PRN

## 2012-07-18 MED ORDER — RISPERIDONE 0.25 MG PO TABS
0.2500 mg | ORAL_TABLET | Freq: Every day | ORAL | Status: DC
  Administered 2012-07-18 – 2012-07-19 (×2): 0.25 mg via ORAL
  Filled 2012-07-18 (×2): qty 1

## 2012-07-18 MED ORDER — ONDANSETRON HCL 4 MG PO TABS: 4.0000 mg | ORAL_TABLET | Freq: Four times a day (QID) | ORAL | Status: DC | PRN

## 2012-07-18 MED ORDER — ONDANSETRON HCL 4 MG/2ML IJ SOLN: 4.0000 mg | Freq: Four times a day (QID) | INTRAMUSCULAR | Status: DC | PRN

## 2012-07-18 MED ORDER — ALUM & MAG HYDROXIDE-SIMETH 200-200-20 MG/5ML PO SUSP: 30.0000 mL | Freq: Four times a day (QID) | ORAL | Status: DC | PRN

## 2012-07-18 MED ORDER — ACETAMINOPHEN 650 MG RE SUPP: 650.0000 mg | Freq: Four times a day (QID) | RECTAL | Status: DC | PRN

## 2012-07-18 NOTE — H&P (Signed)
PCP:   Yates Decamp, MD   Chief Complaint:  Fall  HPI: This is a 71 year old female resident of [facility] nursing home. She is wheelchair-bound for approximately a year. Today she somehow fell forward out of the wheelchair. She did not hit her head, she did not loose consciousness. She was sent to the ER where x-rays revealed fracture of the left lower extremity. Of note, the patient had fallen out of her wheelchair a few weeks ago. At that point she being taken to the ER when swelling of the left lower extremity was noted. She had an ultrasound which was negative for DVT, it is unclear if she could have had a fracture prior to this. The patient is a poor historian as she does suffer from severe dementia. History provided by the patient's son's were present at the bedside.  Patient has no known cardiac history. No report of prior MIs, chest pains, congestive heart failure. No recent surgeries. Patient's mobility is minimal, as stated patient is wheelchair-bound. She does not follow with a cardiologist.  Review of Systems:  Difficult to obtain due to patient's dementia  Past Medical History: Past Medical History  Diagnosis Date  . Dementia   . Osteoarthritis   . Epigastric pain   . Chronic pain   . Hyperlipidemia   . 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 Norovirus   . Osteopenia    History reviewed. No pertinent past surgical history.  Medications: Prior to Admission medications   Medication Sig Start Date End Date Taking? Authorizing Provider  acetaminophen (TYLENOL) 325 MG tablet Take 650 mg by mouth every 6 (six) hours as needed. For pain   Yes Historical Provider, MD  Cranberry 500 MG CAPS Take 1 capsule by mouth daily.   Yes Historical Provider, MD  divalproex (DEPAKOTE SPRINKLE) 125 MG capsule Take 375 mg by mouth 2 (two) times daily. Take three capsules (375 mg total) twice a day   Yes Historical Provider, MD  meloxicam (MOBIC) 15 MG tablet Take 15  mg by mouth daily.   Yes Historical Provider, MD  nitrofurantoin (MACRODANTIN) 50 MG capsule Take 50 mg by mouth daily.   Yes Historical Provider, MD  risperiDONE (RISPERDAL) 0.5 MG tablet Take 0.25-0.5 mg by mouth 2 (two) times daily. 0.5 tab in am, 1 tab in pm   Yes Historical Provider, MD  sertraline (ZOLOFT) 25 MG tablet Take 25 mg by mouth daily.   Yes Historical Provider, MD  pregabalin (LYRICA) 150 MG capsule Take 1 capsule (150 mg total) by mouth 2 (two) times daily. 07/17/12   Nelia Shi, MD  promethazine (PHENERGAN) 25 MG tablet Take 1 tablet (25 mg total) by mouth every 6 (six) hours as needed for nausea. 07/17/12   Nelia Shi, MD    Allergies:  No Known Allergies  Social History:  has an unknown smoking status. She does not have any smokeless tobacco history on file. She reports that she does not drink alcohol or use illicit drugs. resides at [facility], wheelchair-bound   Family History: Multiple sclerosis, diabetes, dementia  Physical Exam: Filed Vitals:   07/17/12 1744 07/17/12 1753 07/18/12 0003 07/18/12 0015  BP:  118/58 129/88   Pulse:  94 91   Temp:   98.3 F (36.8 C)   TempSrc:   Oral   Resp:  18 16   Height:    5\' 2"  (1.575 m)  Weight:    62.2  kg (137 lb 2 oz)  SpO2: 98% 99% 97%     General:  Alert conversant with sons somewhat confused, well developed and nourished, no acute distress Eyes: PERRLA, pink conjunctiva, no scleral icterus ENT: Moist oral mucosa, neck supple, no thyromegaly Lungs: clear to ascultation, no wheeze, no crackles, no use of accessory muscles Cardiovascular: regular rate and rhythm, no regurgitation, no gallops, no murmurs. No carotid bruits, no JVD Abdomen: soft, positive BS, non-tender, non-distended, no organomegaly, not an acute abdomen GU: not examined Neuro: CN II - XII grossly intact, sensation intact Musculoskeletal: strength 5/5 all extremities, no clubbing, cyanosis or 2+ left lower extremity pitting  edema Skin: no rash, no subcutaneous crepitation, no decubitus   Labs on Admission:   [facility] 07/17/12 2126  NA 136  K 3.9  CL 101  CO2 27  GLUCOSE 99  BUN 24*  CREATININE 0.67  CALCIUM 8.6  MG --  PHOS --   No results found for this basename: AST:2,ALT:2,ALKPHOS:2,BILITOT:2,PROT:2,ALBUMIN:2 in the last 72 hours No results found for this basename: LIPASE:2,AMYLASE:2 in the last 72 hours  Basename 07/17/12 2126  WBC 5.9  NEUTROABS 3.8  HGB 11.6*  HCT 34.1*  MCV 89.7  PLT 161   No results found for this basename: CKTOTAL:3,CKMB:3,CKMBINDEX:3,TROPONINI:3 in the last 72 hours No components found with this basename: POCBNP:3 No results found for this basename: DDIMER:2 in the last 72 hours No results found for this basename: HGBA1C:2 in the last 72 hours No results found for this basename: CHOL:2,HDL:2,LDLCALC:2,TRIG:2,CHOLHDL:2,LDLDIRECT:2 in the last 72 hours No results found for this basename: TSH,T4TOTAL,FREET3,T3FREE,THYROIDAB in the last 72 hours No results found for this basename: VITAMINB12:2,FOLATE:2,FERRITIN:2,TIBC:2,IRON:2,RETICCTPCT:2 in the last 72 hours  Micro Results: No results found for this or any previous visit (from the past 240 hour(s)).   Radiological Exams on Admission: Dg Hip Complete Left  07/17/2012  *RADIOLOGY REPORT*  Clinical Data: Left hip and knee pain  LEFT HIP - COMPLETE 2+ VIEW  Comparison: The pelvis film 11/27/2011  Findings: There is a fracture of the left femoral neck which appears to be sub capital.  Hip is located.  IMPRESSION: Left femoral neck fracture.   Original Report Authenticated By: Genevive Bi, M.D.    Dg Knee 1-2 Views Left  07/17/2012  *RADIOLOGY REPORT*  Clinical Data: Knee pain, left hip fracture  LEFT KNEE - 1-2 VIEW  Comparison: Pelvis films same day  Findings: No evidence of fracture of the distal femur.  Knee joint is intact.  IMPRESSION: No distal femur fracture.   Original Report Authenticated By: Genevive Bi, M.D.    Dg Chest Portable 1 View  07/17/2012  *RADIOLOGY REPORT*  Clinical Data: Fall and preop evaluation.  PORTABLE CHEST - 1 VIEW  Comparison: 04/17/2012  Findings: Single view of the chest was obtained.  No evidence for a pneumothorax.  Previous examination demonstrated nodular densities in the right lung that are not clearly identified on this examination.  Those nodular densities probably represented an infectious or inflammatory process.  The lungs are clear with chronic coarse lung markings.  Heart and mediastinum are within normal limits.  Trachea is midline.  IMPRESSION: No acute chest findings.   Original Report Authenticated By: Richarda Overlie, M.D.     EKG: Normal sinus rhythm  Assessment/Plan Present on Admission:  .Femur fracture, left Admit to MedSurg Orthopedics Dr. Fonnie Jarvis aware N.p.o. midnight with IV fluids ordered Pain medications as needed  Patient's a low to moderate risk for surgical intervention Severe dementia Osteoarthritis  Full code DVT prophylaxis  Kalasia Crafton 07/18/2012, 12:27 AM

## 2012-07-18 NOTE — Plan of Care (Signed)
Problem: Phase I Progression Outcomes Goal: Post op clear liquids, advance diet as tolerated Outcome: Not Applicable Date Met:  07/18/12 Pt did not need surgery

## 2012-07-18 NOTE — Consult Note (Signed)
Reason for Consult:left hip pain Referring Physician: Dhungel   Shari Dean is an 71 y.o. female.  HPI: 71 year old female fell out of her wheel chair last night. Son states no LOC. Reports patient hit her head but is at baseline currently. 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 Family reports patient did minimal ambulation prior to recent fall. Family has concerns over left lower leg swelling. Doppler performed 06/25/12 no evidence of deep vein thrombosis involving the left lower extremity and right common femoral vein.     Past Medical History  Diagnosis Date  . Dementia   . Osteoarthritis   . Epigastric pain   . Chronic pain   . Hyperlipidemia   . 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 Norovirus   . Osteopenia     History reviewed. No pertinent past surgical history.  Family History  Problem Relation Age of Onset  . Diabetes type II    . Multiple sclerosis    . Dementia      Social History:  reports that she has quit smoking. She does not have any smokeless tobacco history on file. She reports that she does not drink alcohol or use illicit drugs.  Allergies: No Known Allergies  Medications: I have reviewed the patient's current medications.  Results for orders placed during the hospital encounter of 07/17/12 (from the past 48 hour(s))  GLUCOSE, CAPILLARY     Status: Abnormal   Collection Time   07/17/12  6:17 PM      Component Value Range Comment   Glucose-Capillary 106 (*) 70 - 99 mg/dL    Comment 1 Notify RN     BASIC METABOLIC PANEL     Status: Abnormal   Collection Time   07/17/12  9:26 PM      Component Value Range Comment   Sodium 136  135 - 145 mEq/L    Potassium 3.9  3.5 - 5.1 mEq/L    Chloride 101  96 - 112 mEq/L    CO2 27  19 - 32 mEq/L    Glucose, Bld 99  70 - 99 mg/dL    BUN 24 (*) 6 - 23 mg/dL    Creatinine, Ser 1.61  0.50 - 1.10 mg/dL    Calcium 8.6  8.4 - 09.6 mg/dL    GFR calc non Af Amer 87 (*) >90  mL/min    GFR calc Af Amer >90  >90 mL/min   CBC WITH DIFFERENTIAL     Status: Abnormal   Collection Time   07/17/12  9:26 PM      Component Value Range Comment   WBC 5.9  4.0 - 10.5 K/uL    RBC 3.80 (*) 3.87 - 5.11 MIL/uL    Hemoglobin 11.6 (*) 12.0 - 15.0 g/dL    HCT 04.5 (*) 40.9 - 46.0 %    MCV 89.7  78.0 - 100.0 fL    MCH 30.5  26.0 - 34.0 pg    MCHC 34.0  30.0 - 36.0 g/dL    RDW 81.1  91.4 - 78.2 %    Platelets 161  150 - 400 K/uL    Neutrophils Relative 65  43 - 77 %    Neutro Abs 3.8  1.7 - 7.7 K/uL    Lymphocytes Relative 24  12 - 46 %    Lymphs Abs 1.4  0.7 - 4.0 K/uL    Monocytes Relative 10  3 - 12 %  Monocytes Absolute 0.6  0.1 - 1.0 K/uL    Eosinophils Relative 1  0 - 5 %    Eosinophils Absolute 0.1  0.0 - 0.7 K/uL    Basophils Relative 0  0 - 1 %    Basophils Absolute 0.0  0.0 - 0.1 K/uL   PROTIME-INR     Status: Normal   Collection Time   07/17/12  9:26 PM      Component Value Range Comment   Prothrombin Time 13.3  11.6 - 15.2 seconds    INR 1.02  0.00 - 1.49   MRSA PCR SCREENING     Status: Normal   Collection Time   07/18/12  1:42 AM      Component Value Range Comment   MRSA by PCR NEGATIVE  NEGATIVE   BASIC METABOLIC PANEL     Status: Abnormal   Collection Time   07/18/12  4:40 AM      Component Value Range Comment   Sodium 137  135 - 145 mEq/L    Potassium 4.0  3.5 - 5.1 mEq/L    Chloride 101  96 - 112 mEq/L    CO2 26  19 - 32 mEq/L    Glucose, Bld 97  70 - 99 mg/dL    BUN 17  6 - 23 mg/dL    Creatinine, Ser 1.61  0.50 - 1.10 mg/dL    Calcium 9.0  8.4 - 09.6 mg/dL    GFR calc non Af Amer 88 (*) >90 mL/min    GFR calc Af Amer >90  >90 mL/min   CBC     Status: Normal   Collection Time   07/18/12  4:40 AM      Component Value Range Comment   WBC 6.0  4.0 - 10.5 K/uL    RBC 4.31  3.87 - 5.11 MIL/uL    Hemoglobin 12.9  12.0 - 15.0 g/dL    HCT 04.5  40.9 - 81.1 %    MCV 90.0  78.0 - 100.0 fL    MCH 29.9  26.0 - 34.0 pg    MCHC 33.2  30.0 - 36.0  g/dL    RDW 91.4  78.2 - 95.6 %    Platelets 160  150 - 400 K/uL   PROTIME-INR     Status: Normal   Collection Time   07/18/12  4:40 AM      Component Value Range Comment   Prothrombin Time 13.3  11.6 - 15.2 seconds    INR 1.02  0.00 - 1.49     Dg Hip Complete Left  07/17/2012  *RADIOLOGY REPORT*  Clinical Data: Left hip and knee pain  LEFT HIP - COMPLETE 2+ VIEW  Comparison: The pelvis film 11/27/2011  Findings: There is a fracture of the left femoral neck which appears to be sub capital.  Hip is located.  IMPRESSION: Left femoral neck fracture.   Original Report Authenticated By: Genevive Bi, M.D.    Dg Knee 1-2 Views Left  07/17/2012  *RADIOLOGY REPORT*  Clinical Data: Knee pain, left hip fracture  LEFT KNEE - 1-2 VIEW  Comparison: Pelvis films same day  Findings: No evidence of fracture of the distal femur.  Knee joint is intact.  IMPRESSION: No distal femur fracture.   Original Report Authenticated By: Genevive Bi, M.D.    Dg Chest Portable 1 View  07/17/2012  *RADIOLOGY REPORT*  Clinical Data: Fall and preop evaluation.  PORTABLE CHEST - 1 VIEW  Comparison: 04/17/2012  Findings:  Single view of the chest was obtained.  No evidence for a pneumothorax.  Previous examination demonstrated nodular densities in the right lung that are not clearly identified on this examination.  Those nodular densities probably represented an infectious or inflammatory process.  The lungs are clear with chronic coarse lung markings.  Heart and mediastinum are within normal limits.  Trachea is midline.  IMPRESSION: No acute chest findings.   Original Report Authenticated By: Richarda Overlie, M.D.     Review of Systems  Unable to perform ROS: dementia   Blood pressure 116/66, pulse 93, temperature 97.7 F (36.5 C), temperature source Oral, resp. rate 20, height 5\' 2"  (1.575 m), weight 62.2 kg (137 lb 2 oz), SpO2 99.00%. Physical Exam  Constitutional:       Frail appearing female with legs drawn . Appears  comfortable but does not awaken to verbal or gentle sternal rub. Did awaken with straightening of left leg for radiographs  HENT:       Ecchymosis left frontal region and left cheek. Opens eyes when legs straightened.  Eyes:       Opens eyes when left leg straightened  Cardiovascular: Normal rate and intact distal pulses.   Respiratory: Effort normal.  Musculoskeletal:       No obvious leg length discrepancy. No obvious deformity of either lower leg or feet/ankles.   Neurological:       Does not awaken will wiggle toes on verbal command  Skin: Skin is warm and dry.    Assessment/Plan: Possible left hip subcapital fracture- will obtain CT scan to r/o fracture Dementia  Minimal Ambulator prior to fall  [place], Nyhla Mountjoy 07/18/2012, 9:58 AM

## 2012-07-18 NOTE — Progress Notes (Signed)
Patient seen and examined. Family at bedside. Appears sleepy and poorly interactive. Family at bedside. Vital stable. Recent H&P reviewed. Patient seen by orthopedic consult and had a repeat chest CT of the left hip done which was negative for a fracture. Orthopedic have signed out Family concerned about left leg swelling. Patient was seen in the ED 3 weeks ago with left leg swelling and had Doppler off her extremities which was negative for DVT.  Admission history mentions patient having fall from  wheelchair and landing on her face. On my exam today patient is not really entered active. I will get a head CT to rule out any intracerebral bleed. We'll also check for UA. If workup negative patient can be discharged back to nursing home tomorrow

## 2012-07-18 NOTE — Plan of Care (Signed)
Problem: Phase II Progression Outcomes Goal: Discharge plan established Outcome: Completed/Met Date Met:  07/18/12 To return to SNF

## 2012-07-18 NOTE — Progress Notes (Signed)
CSW was contacted by MD concerning Pt returning to [facility] today.   CSW contacted [facility] to assess ability to return to facility today. [facility] will not be able to accept the Pt back today. Facility stated that the Pt has to be reassessed by the nurse before acceptance back into facility.   CSW notified MD.   Weekday CSW will assist with d/c planning back to [facility].   Leron Croak, LCSWA [employer] [phone]

## 2012-07-19 DIAGNOSIS — N390 Urinary tract infection, site not specified: Secondary | ICD-10-CM | POA: Diagnosis present

## 2012-07-19 DIAGNOSIS — W19XXXA Unspecified fall, initial encounter: Secondary | ICD-10-CM

## 2012-07-19 DIAGNOSIS — F0390 Unspecified dementia without behavioral disturbance: Secondary | ICD-10-CM

## 2012-07-19 MED ORDER — CIPROFLOXACIN HCL 250 MG PO TABS: 250.0000 mg | ORAL_TABLET | Freq: Two times a day (BID) | ORAL | Status: DC

## 2012-07-19 NOTE — Clinical Documentation Improvement (Signed)
Abnormal Labs Clarification  THIS DOCUMENT IS NOT A PERMANENT PART OF THE MEDICAL RECORD  TO RESPOND TO THE THIS QUERY, FOLLOW THE INSTRUCTIONS BELOW:  1. If needed, update documentation for the patient's encounter via the notes activity.  2. Access this query again and click edit on the [employer].  3. After updating, or not, click F2 to complete all highlighted (required) fields concerning your review. Select "additional documentation in the medical record" OR "no additional documentation provided".  4. Click Sign note button.  5. The deficiency will fall out of your InBasket *Please let us know if you are not able to complete this workflow by phone or e-mail (listed below).  Please update your documentation within the medical record to reflect your response to this query.                                                                                   07/19/12  Dear Dr.  Gonzella Lex, N[facility]Associ[facility]  In a better effort to capture your patient's severity of illness, reflect appropriate length of stay and utilization of resources, a review of the medical record has revealed the following indicators.    Based on your clinical judgment, please clarify and document in a progress note and/or discharge summary the clinical condition associated with the following supporting information:  In responding to this query please exercise your independent judgment.  The fact that a query is asked, does not imply that any particular answer is desired or expected.  Abnormal findings (laboratory, x-ray, pathologic, and other diagnostic results) are not coded and reported unless the physician indicates their clinical significance.   The medical record reflects the following clinical findings, please clarify the diagnostic and/or clinical significance:       Based on your clinical judgment can you provide a diagnosis that represents the below listed clinical indicators?  In this patient admitted with  left femur fracture a review of the medical records reveals the following:  Urine analysis  Many Bacteria  Cloudy   Positive for Nitrite  Continued monitoring  Please document the underlying diagnosis responsible for the above abnormal lab values in the medical record.    Possible Clinical Conditions?                                                                       Other Condition___________________                Cannot Clinically Determine_________       Supporting Information: Femur fracture, Severe Dementia, OA,   Signs / Symptoms: Abnormal UA lab test  Diagnostics:   Component     Latest Ref Rng 07/18/2012  APPearance     CLEAR CLOUDY (A)   Component     Latest Ref Rng 07/18/2012  Nitrite     NEGATIVE POSITIVE (A)   Component     Latest Ref Rng 07/18/2012  Bacteria, UA  RARE MANY (A)    Treatment  monitoring   Reviewed:   Thank You,  Enis Slipper  RN, BSN, MSN/Inf, CCDS Clinical Documentation Specialist [facility] HIM Dept Pager: [phone] / E-mail: [email or web address]  Health Information Management [place]

## 2012-07-19 NOTE — Progress Notes (Signed)
CSW was consulted to complete discharge of patient. Pt to transfer to [facility] today via [facility]. [facility] and the patient's legal guardian are aware of d/c. D/C packet complete with chart copy, signed FL2, and signed hard Rx.  CSW signing off as no other CSW needs identified at this time.  Lia Foyer, LCSWA [facility] Clinical Social Worker Contact #: [phone] (weekend)

## 2012-07-19 NOTE — Discharge Summary (Signed)
Physician Discharge Summary  Shari Dean ZOX:[id number] DOB: [birth year] DOA: 07/17/2012  PCP: Yates Decamp, MD  Admit date: 07/17/2012 Discharge date: 07/19/2012  Recommendations for Outpatient Follow-up:  1. Return to ALF  Discharge Diagnoses:  Principal Problem:  *Fall at nursing home with concern for her left hip fracture  Active Problems:  Dementia  Osteoarthritis  UTI (urinary tract infection)   Discharge Condition: Fair  Diet recommendation: Regular  Filed Weights   07/18/12 0015  Weight: 62.2 kg (137 lb 2 oz)    History of present illness:  71 year old female resident of [facility] nursing home. She is wheelchair-bound for approximately a year. Today she somehow fell forward out of the wheelchair. She did not hit her head, she did not loose consciousness. She was sent to the ER where x-rays revealed fracture of the left lower extremity. Of note, the patient had fallen out of her wheelchair a few weeks ago. At that point she being taken to the ER when swelling of the left lower extremity was noted. She had an ultrasound which was negative for DVT, it is unclear if she could have had a fracture prior to this. The patient is a poor historian as she does suffer from severe dementia. History provided by the patient's son's were present at the bedside.   Hospital Course:  Patient was admitted to medical floor with concern for left femur fracture seen on x-ray. She was seen by orthopedics next morning and had a CT of the left hip done with doubt about the fracture. CT of the left hip showed no finding of fracture and orthopedics signed off. Patient was noted to be sleepy on my exam yesterday and given that she fell on her left side of the face a head CT was done with concern for a head injury and was negative as well. She was likely sleepy and lethargic because of the narcotics she had received on admission. A urinalysis done for patient's foul-smelling urine was positive for UTI  and patient has been started on oral ciprofloxacin since this morning for a 3 days duration. A urine culture has been sent and can be followed up as outpatient. Patient has left leg swelling for several weeks and family concerned. she was seen in the ED 3 weeks back and had doppler of kef leg which was negative for DVT. Patient is clinically stable and can be discharged back to ALF with outpatient followup.  Procedures:  None  Consultations:  Orthopedics  Discharge Exam: Filed Vitals:   07/18/12 0508 07/18/12 1430 07/18/12 2135 07/19/12 0612  BP: 116/66 143/83 122/68 122/78  Pulse: 93 89 79 79  Temp: 97.7 F (36.5 C) 99 F (37.2 C) 99 F (37.2 C) 97.7 F (36.5 C)  TempSrc: Oral Axillary Axillary Oral  Resp: 20 16 16 20   Height:      Weight:      SpO2: 99% 90% 95% 96%     General:  Elderly female in bed in NAD speaks few words  Cardiovascular: NS1 and S2, no murmurs  Respiratory: Clear breath sounds bilaterally, no added sounds  Abdomen: Soft, nontender nondistended bowel sounds present  Extremities warm, left leg swelling , moves all limbs   CNS : alert and awake.oriented to self only.    Discharge Instructions     Medication List     As of 07/19/2012  9:59 AM    STOP taking these medications         nitrofurantoin 50 MG capsule  Commonly known as: MACRODANTIN      TAKE these medications         acetaminophen 325 MG tablet   Commonly known as: TYLENOL   Take 650 mg by mouth every 6 (six) hours as needed. For pain      ciprofloxacin 250 MG tablet   Commonly known as: CIPRO   Take 1 tablet (250 mg total) by mouth 2 (two) times daily.      Cranberry 500 MG Caps   Take 1 capsule by mouth daily.      divalproex 125 MG capsule   Commonly known as: DEPAKOTE SPRINKLE   Take 375 mg by mouth 2 (two) times daily. Take three capsules (375 mg total) twice a day      meloxicam 15 MG tablet   Commonly known as: MOBIC   Take 15 mg by mouth daily.       pregabalin 150 MG capsule   Commonly known as: LYRICA   Take 1 capsule (150 mg total) by mouth 2 (two) times daily.      promethazine 25 MG tablet   Commonly known as: PHENERGAN   Take 1 tablet (25 mg total) by mouth every 6 (six) hours as needed for nausea.      risperiDONE 0.5 MG tablet   Commonly known as: RISPERDAL   Take 0.25-0.5 mg by mouth 2 (two) times daily. 0.5 tab in am, 1 tab in pm      sertraline 25 MG tablet   Commonly known as: ZOLOFT   Take 25 mg by mouth daily.           Follow-up Information    Follow up with Yates Decamp, MD. In 1 week. (Return to emergency department if symptoms worsen)    Contact information:   [address] [place] [address] [phone]           The results of significant diagnostics from this hospitalization (including imaging, microbiology, ancillary and laboratory) are listed below for reference.    Significant Diagnostic Studies: Dg Hip Complete Left  07/17/2012  *RADIOLOGY REPORT*  Clinical Data: Left hip and knee pain  LEFT HIP - COMPLETE 2+ VIEW  Comparison: The pelvis film 11/27/2011  Findings: There is a fracture of the left femoral neck which appears to be sub capital.  Hip is located.  IMPRESSION: Left femoral neck fracture.   Original Report Authenticated By: Genevive Bi, M.D.    Dg Knee 1-2 Views Left  07/17/2012  *RADIOLOGY REPORT*  Clinical Data: Knee pain, left hip fracture  LEFT KNEE - 1-2 VIEW  Comparison: Pelvis films same day  Findings: No evidence of fracture of the distal femur.  Knee joint is intact.  IMPRESSION: No distal femur fracture.   Original Report Authenticated By: Genevive Bi, M.D.    Ct Head Wo Contrast  07/18/2012  *RADIOLOGY REPORT*  Clinical Data: Larey Seat out of wheelchair 07/17/12.  No obvious external injuries.  History of dementia.  CT HEAD WITHOUT CONTRAST  Technique:  Contiguous axial images were obtained from the base of the skull through the vertex without contrast.   Comparison: 06/25/2012  Findings: There is no evidence for acute infarction, intracranial hemorrhage, mass lesion, hydrocephalus, or extra-axial fluid. Moderate to severe atrophy is present.  Advanced chronic microvascular ischemic change is present in the periventricular and subcortical white matter.  Chronic microvascular ischemic changes noted.  The calvarium is intact.  There is no sinus or mastoid disease.  There is no significant scalp  hematoma. Stable appearance compared with priors.  IMPRESSION: Stable exam.  Chronic changes as described.  No skull fracture, significant scalp hematoma, or intracranial hemorrhage.   Original Report Authenticated By: Elsie Stain, M.D.    Ct Head Wo Contrast  06/25/2012  *RADIOLOGY REPORT*  Clinical Data:  Fall.  Head injury.  Neck pain.  CT HEAD WITHOUT CONTRAST CT CERVICAL SPINE WITHOUT CONTRAST  Technique:  Multidetector CT imaging of the head and cervical spine was performed following the standard protocol without intravenous contrast.  Multiplanar CT image reconstructions of the cervical spine were also generated.  Comparison:  06/01/2012 and 04/17/2012  CT HEAD  Findings: There is no evidence of intracranial hemorrhage, brain edema or other signs of acute infarction.  There is no evidence of intracranial mass lesion or mass effect.  No abnormal extra-axial fluid collections are identified.  Central pattern of cerebral atrophy and associated ventriculomegaly remains stable.  Mild to moderate chronic small vessel disease also stable in appearance.  No skull abnormality identified.  IMPRESSION:  1.  No acute intracranial abnormality. 2.  Stable cerebral atrophy and chronic small vessel disease.  CT CERVICAL SPINE  Findings: No evidence of cervical spine fracture or subluxation.  Degenerative disc disease is again seen which is most pronounced at C6-7.  Moderate bilateral facet DJD is also seen at most cervical levels with moderate to severe atlantoaxial degenerative  changes. These findings show no change compared to prior exam.  IMPRESSION:  1.  No evidence of acute cervical spine fracture or subluxation. 2.  Stable degenerative cervical spondylosis, as described above.   Original Report Authenticated By: Danae Orleans, M.D.    Ct Cervical Spine Wo Contrast  06/25/2012  *RADIOLOGY REPORT*  Clinical Data:  Fall.  Head injury.  Neck pain.  CT HEAD WITHOUT CONTRAST CT CERVICAL SPINE WITHOUT CONTRAST  Technique:  Multidetector CT imaging of the head and cervical spine was performed following the standard protocol without intravenous contrast.  Multiplanar CT image reconstructions of the cervical spine were also generated.  Comparison:  06/01/2012 and 04/17/2012  CT HEAD  Findings: There is no evidence of intracranial hemorrhage, brain edema or other signs of acute infarction.  There is no evidence of intracranial mass lesion or mass effect.  No abnormal extra-axial fluid collections are identified.  Central pattern of cerebral atrophy and associated ventriculomegaly remains stable.  Mild to moderate chronic small vessel disease also stable in appearance.  No skull abnormality identified.  IMPRESSION:  1.  No acute intracranial abnormality. 2.  Stable cerebral atrophy and chronic small vessel disease.  CT CERVICAL SPINE  Findings: No evidence of cervical spine fracture or subluxation.  Degenerative disc disease is again seen which is most pronounced at C6-7.  Moderate bilateral facet DJD is also seen at most cervical levels with moderate to severe atlantoaxial degenerative changes. These findings show no change compared to prior exam.  IMPRESSION:  1.  No evidence of acute cervical spine fracture or subluxation. 2.  Stable degenerative cervical spondylosis, as described above.   Original Report Authenticated By: Danae Orleans, M.D.    Ct Hip Left Wo Contrast  07/18/2012  *RADIOLOGY REPORT*  Clinical Data: Status post fall.  Pain.  Question fracture.  CT OF THE LEFT HIP WITHOUT  CONTRAST  Technique:  Multidetector CT imaging was performed according to the standard protocol. Multiplanar CT image reconstructions were also generated.  Comparison: Plain films 07/18/2012 and 07/17/2012. CT abdomen and pelvis 10/12/2011.  Findings: The left  hip is located.  No acute fracture is identified. Old right sacral ala fracture is identified as on the prior CT scan.  Moderate left hip degenerative change is noted.  No hip joint effusion is identified.  No hematoma or other fluid collection is present.  Imaged intrapelvic contents demonstrate postoperative change of hysterectomy.  Extensive atherosclerotic vascular disease is noted.  The patient has lower lumbar spondylosis.  IMPRESSION:  1.  Negative for fracture or other acute abnormality. Old right sacral ala fracture is noted. 2.  Osteoarthritis of the hips and lower lumbar spondylosis. 3.  Atherosclerosis.   Original Report Authenticated By: Bernadene Bell. D'ALESSIO, M.D.    Dg Chest Portable 1 View  07/17/2012  *RADIOLOGY REPORT*  Clinical Data: Fall and preop evaluation.  PORTABLE CHEST - 1 VIEW  Comparison: 04/17/2012  Findings: Single view of the chest was obtained.  No evidence for a pneumothorax.  Previous examination demonstrated nodular densities in the right lung that are not clearly identified on this examination.  Those nodular densities probably represented an infectious or inflammatory process.  The lungs are clear with chronic coarse lung markings.  Heart and mediastinum are within normal limits.  Trachea is midline.  IMPRESSION: No acute chest findings.   Original Report Authenticated By: Richarda Overlie, M.D.    Dg Hip Portable 1 View Left  07/18/2012  *RADIOLOGY REPORT*  Clinical Data: Re-evaluate for left hip fracture  PORTABLE LEFT HIP - 1 VIEW  Comparison: Left hip radiographs dated 07/17/2012  Findings: No left hip fracture is seen on this single frontal view. Appearance on the prior study apparently reflected rotation/patient  position.  Mild degenerative changes.  If there is continued clinical concern for fracture, consider a frog leg lateral view.  IMPRESSION: No left hip fracture is seen.  If there is continued clinical concern for fracture, consider a frog leg lateral view.   Original Report Authenticated By: Charline Bills, M.D.     Microbiology: Recent Results (from the past 240 hour(s))  MRSA PCR SCREENING     Status: Normal   Collection Time   07/18/12  1:42 AM      Component Value Range Status Comment   MRSA by PCR NEGATIVE  NEGATIVE Final      Labs: Basic Metabolic Panel:  Lab 07/18/12 1610 07/17/12 2126  NA 137 136  K 4.0 3.9  CL 101 101  CO2 26 27  GLUCOSE 97 99  BUN 17 24*  CREATININE 0.64 0.67  CALCIUM 9.0 8.6  MG -- --  PHOS -- --   Liver Function Tests: No results found for this basename: AST:5,ALT:5,ALKPHOS:5,BILITOT:5,PROT:5,ALBUMIN:5 in the last 168 hours No results found for this basename: LIPASE:5,AMYLASE:5 in the last 168 hours No results found for this basename: AMMONIA:5 in the last 168 hours CBC:  Lab 07/18/12 0440 07/17/12 2126  WBC 6.0 5.9  NEUTROABS -- 3.8  HGB 12.9 11.6*  HCT 38.8 34.1*  MCV 90.0 89.7  PLT 160 161   Cardiac Enzymes: No results found for this basename: CKTOTAL:5,CKMB:5,CKMBINDEX:5,TROPONINI:5 in the last 168 hours BNP: BNP (last 3 results) No results found for this basename: PROBNP:3 in the last 8760 hours CBG:  Lab 07/17/12 1817  GLUCAP 106*    Time coordinating discharge: 20 minutes  Signed:  Eddie North  [facility] 07/19/2012, 9:59 AM

## 2012-07-21 LAB — URINE CULTURE

## 2012-08-22 ENCOUNTER — Emergency Department (HOSPITAL_COMMUNITY): Payer: Medicare Other

## 2012-08-22 ENCOUNTER — Emergency Department (HOSPITAL_COMMUNITY)
Admission: EM | Admit: 2012-08-22 | Discharge: 2012-08-22 | Disposition: A | Discharge status: Home / Self Care | Payer: Medicare Other | Attending: Emergency Medicine | Admitting: Emergency Medicine

## 2012-08-22 ENCOUNTER — Encounter (HOSPITAL_COMMUNITY): Payer: Self-pay | Admitting: *Deleted

## 2012-08-22 DIAGNOSIS — Z79899 Other long term (current) drug therapy: Secondary | ICD-10-CM | POA: Insufficient documentation

## 2012-08-22 DIAGNOSIS — W19XXXA Unspecified fall, initial encounter: Secondary | ICD-10-CM

## 2012-08-22 DIAGNOSIS — Z87891 Personal history of nicotine dependence: Secondary | ICD-10-CM | POA: Insufficient documentation

## 2012-08-22 DIAGNOSIS — Z043 Encounter for examination and observation following other accident: Secondary | ICD-10-CM | POA: Insufficient documentation

## 2012-08-22 DIAGNOSIS — M949 Disorder of cartilage, unspecified: Secondary | ICD-10-CM | POA: Insufficient documentation

## 2012-08-22 DIAGNOSIS — M899 Disorder of bone, unspecified: Secondary | ICD-10-CM | POA: Insufficient documentation

## 2012-08-22 DIAGNOSIS — M1990 Unspecified osteoarthritis, unspecified site: Secondary | ICD-10-CM | POA: Insufficient documentation

## 2012-08-22 DIAGNOSIS — W050XXA Fall from non-moving wheelchair, initial encounter: Secondary | ICD-10-CM | POA: Insufficient documentation

## 2012-08-22 DIAGNOSIS — E785 Hyperlipidemia, unspecified: Secondary | ICD-10-CM | POA: Insufficient documentation

## 2012-08-22 DIAGNOSIS — Y9210 Unspecified residential institution as the place of occurrence of the external cause: Secondary | ICD-10-CM | POA: Insufficient documentation

## 2012-08-22 DIAGNOSIS — G8929 Other chronic pain: Secondary | ICD-10-CM | POA: Insufficient documentation

## 2012-08-22 DIAGNOSIS — F0390 Unspecified dementia without behavioral disturbance: Secondary | ICD-10-CM | POA: Insufficient documentation

## 2012-08-22 DIAGNOSIS — Z862 Personal history of diseases of the blood and blood-forming organs and certain disorders involving the immune mechanism: Secondary | ICD-10-CM | POA: Insufficient documentation

## 2012-08-22 DIAGNOSIS — Y9389 Activity, other specified: Secondary | ICD-10-CM | POA: Insufficient documentation

## 2012-08-22 NOTE — ED Notes (Signed)
[facility] called to transport pt. 

## 2012-08-22 NOTE — ED Notes (Signed)
Per ems pt is from [facility] living. Hx of dementia, pt alert and oriented x4 to her norm. Pt was sitting in wheelchair on carpetted area, staff described how pt fell. Pt slid out of her chair onto carpetted floor, no injury noted. But "fall" was unwittnessed so pt must be evaluted at ED. No new complaints. Staff reports pt has chronic pain, "hurting all over". Staff reports "pt cries all the time, and she is very dramatic."

## 2012-08-22 NOTE — ED Notes (Signed)
Pt to CT and xray  

## 2012-08-22 NOTE — ED Notes (Signed)
ZOX:[id number]<AV> Expected date:08/22/12<BR> Expected time: 7:18 AM<BR> Means of arrival:Ambulance<BR> Comments:<BR> Fall From WC no injury

## 2012-08-22 NOTE — ED Notes (Signed)
[facility] arrived 

## 2012-08-22 NOTE — ED Notes (Signed)
Pt alert and oriented x4. Respirations even and unlabored, bilateral symmetrical rise and fall of chest. Skin warm and dry. In no acute distress. Denies needs.   

## 2012-08-22 NOTE — ED Notes (Signed)
Pt sons at bedside with pt.

## 2012-08-22 NOTE — ED Provider Notes (Signed)
History     CSN: [id number]  Arrival date & time 08/22/12  7846   First MD Initiated Contact with Patient 08/22/12 [phone]      Chief Complaint  Patient presents with  . slid from wheelchair to ground   . Fall    (Consider location/radiation/quality/duration/timing/severity/associated sxs/prior treatment) Patient is a 71 y.o. female presenting with fall. The history is provided by the nursing home and the EMS personnel.  Fall   level V caveat applies to the history due to her dementia. [facility] history of dementia. According to the nursing home report the patient slid out of a chair wheelchair onto a carpeted area. There is no evidence of injury fall was unwitnessed though so she was sent in here for evaluation. Patient has history of significant dementia and also has a history of chronic pain. Patient here has no complaints.  Past Medical History  Diagnosis Date  . Dementia   . Osteoarthritis   . Epigastric pain   . Chronic pain   . Hyperlipidemia   . 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 Norovirus   . Osteopenia     History reviewed. No pertinent past surgical history.  Family History  Problem Relation Age of Onset  . Diabetes type II    . Multiple sclerosis    . Dementia      History  Substance Use Topics  . Smoking status: Former Games developer  . Smokeless tobacco: Not on file  . Alcohol Use: No    OB History    Grav Para Term Preterm Abortions TAB SAB Ect Mult Living                  Review of Systems  Unable to perform ROS  level V caveat applies review systems due to her dementia.  Allergies  Review of patient's allergies indicates no known allergies.  Home Medications   Current Outpatient Rx  Name  Route  Sig  Dispense  Refill  . ACETAMINOPHEN 325 MG PO TABS   Oral   Take 650 mg by mouth every 6 (six) hours as needed. For pain         . CRANBERRY 500 MG PO CAPS   Oral   Take 1  capsule by mouth daily.         Marland Kitchen DIVALPROEX SODIUM 125 MG PO CPSP   Oral   Take 375 mg by mouth 2 (two) times daily. Take three capsules (375 mg total) twice a day         . MELOXICAM 15 MG PO TABS   Oral   Take 15 mg by mouth daily.         Marland Kitchen RISPERIDONE 0.25 MG PO TABS   Oral   Take 0.25 mg by mouth daily. Take at 5pm         . SERTRALINE HCL 25 MG PO TABS   Oral   Take 25 mg by mouth daily.         Marland Kitchen TEMAZEPAM 7.5 MG PO CAPS   Oral   Take 7.5 mg by mouth at bedtime.           BP 160/66  Pulse 61  Temp 97.9 F (36.6 C) (Oral)  Resp 16  SpO2 97%  Physical Exam  Nursing note and vitals reviewed. Constitutional: No distress.  HENT:  Head: Normocephalic and atraumatic.  Eyes: EOM are normal. Pupils  are equal, round, and reactive to light.  Neck: Neck supple.  Cardiovascular: Normal rate, regular rhythm and normal heart sounds.   Pulmonary/Chest: Effort normal and breath sounds normal.  Abdominal: Soft. Bowel sounds are normal. There is no tenderness.  Musculoskeletal: She exhibits no tenderness.  Neurological: She is alert.       Moving all 4 extremities some.  Skin: Skin is warm. No rash noted.    ED Course  Procedures (including critical care time)  Labs Reviewed - No data to display Dg Hip Bilateral W/pelvis  08/22/2012  *RADIOLOGY REPORT*  Clinical Data: Fall, pain. Dementia.  BILATERAL HIP WITH PELVIS - 4+ VIEW  Comparison: Plain films 03/24/2012.  CT of the left hip 07/18/2012  Findings: There are irregularities of both sacral alae consistent with old insufficiency fractures.  No definite new fracture is seen.  There is no SI joint diastasis.  The patient is osteopenic. Slight irregularity of the right medial pubis bone is preexisting.  Both hips are located.  There is no visible hip fracture within limits of detection due to inability of the patient to cooperate for positioning.  Degenerative change lumbar spine.  Prior IVC filter is incompletely  visualized.  Scattered phleboliths.  IMPRESSION: No visible acute hip fracture or dislocation.  Previous sacral ala fractures.   Original Report Authenticated By: Davonna Belling, M.D.    Ct Head Wo Contrast  08/22/2012  *RADIOLOGY REPORT*  Clinical Data:  History of trauma from a fall.  Head and neck pain.  CT HEAD WITHOUT CONTRAST CT CERVICAL SPINE WITHOUT CONTRAST  Technique:  Multidetector CT imaging of the head and cervical spine was performed following the standard protocol without intravenous contrast.  Multiplanar CT image reconstructions of the cervical spine were also generated.  Comparison:  Head CT 07/18/2012.  The c-spine CT 06/25/2012.  CT HEAD  Findings: Cerebral atrophy with ex vacuo dilatation of the ventricular system.  Patchy and confluent areas of decreased attenuation throughout the deep and periventricular white matter of the cerebral hemispheres bilaterally, similar to the prior examination, compatible with chronic microvascular ischemic changes.  No acute displaced skull fractures are identified.  No acute intracranial abnormality.  Specifically, no evidence of acute post-traumatic intracranial hemorrhage, no definite regions of acute/subacute cerebral ischemia, no focal mass, mass effect, hydrocephalus or abnormal intra or extra-axial fluid collections. The visualized paranasal sinuses and mastoids are well pneumatized, with exception of some mucosal thickening throughout the ethmoid sinuses bilaterally, and a small air fluid level in the left frontal sinus.  IMPRESSION: 1.  No acute displaced skull fractures or findings of significant acute traumatic injury to the brain. 2.  The appearance of the brain is unchanged compared to prior study 07/18/2012, as above. 3.  Paranasal sinus disease, as above, including a small air-fluid level in the left frontal sinus.  Clinical correlation for signs and symptoms of acute sinusitis is recommended.  CT CERVICAL SPINE  Findings: No acute displaced  fractures of the cervical spine are identified. 3 mm of anterolisthesis of C5 on C6 is unchanged. Alignment is otherwise anatomic.  There is multilevel degenerative disc disease and facet arthropathy, most severe at C5-C6 and C6-C7. Prevertebral soft tissues are normal.  Visualized portions of the upper thorax are remarkable for some bilateral apical pleuroparenchymal thickening, most compatible with chronic scarring.  IMPRESSION: 1.  No evidence of significant acute traumatic injury to the cervical spine. 2.  Multilevel degenerative disc disease and cervical spondylosis redemonstrated, as above.   Original Report  Authenticated By: Trudie Reed, M.D.    Ct Cervical Spine Wo Contrast  08/22/2012  *RADIOLOGY REPORT*  Clinical Data:  History of trauma from a fall.  Head and neck pain.  CT HEAD WITHOUT CONTRAST CT CERVICAL SPINE WITHOUT CONTRAST  Technique:  Multidetector CT imaging of the head and cervical spine was performed following the standard protocol without intravenous contrast.  Multiplanar CT image reconstructions of the cervical spine were also generated.  Comparison:  Head CT 07/18/2012.  The c-spine CT 06/25/2012.  CT HEAD  Findings: Cerebral atrophy with ex vacuo dilatation of the ventricular system.  Patchy and confluent areas of decreased attenuation throughout the deep and periventricular white matter of the cerebral hemispheres bilaterally, similar to the prior examination, compatible with chronic microvascular ischemic changes.  No acute displaced skull fractures are identified.  No acute intracranial abnormality.  Specifically, no evidence of acute post-traumatic intracranial hemorrhage, no definite regions of acute/subacute cerebral ischemia, no focal mass, mass effect, hydrocephalus or abnormal intra or extra-axial fluid collections. The visualized paranasal sinuses and mastoids are well pneumatized, with exception of some mucosal thickening throughout the ethmoid sinuses bilaterally, and a  small air fluid level in the left frontal sinus.  IMPRESSION: 1.  No acute displaced skull fractures or findings of significant acute traumatic injury to the brain. 2.  The appearance of the brain is unchanged compared to prior study 07/18/2012, as above. 3.  Paranasal sinus disease, as above, including a small air-fluid level in the left frontal sinus.  Clinical correlation for signs and symptoms of acute sinusitis is recommended.  CT CERVICAL SPINE  Findings: No acute displaced fractures of the cervical spine are identified. 3 mm of anterolisthesis of C5 on C6 is unchanged. Alignment is otherwise anatomic.  There is multilevel degenerative disc disease and facet arthropathy, most severe at C5-C6 and C6-C7. Prevertebral soft tissues are normal.  Visualized portions of the upper thorax are remarkable for some bilateral apical pleuroparenchymal thickening, most compatible with chronic scarring.  IMPRESSION: 1.  No evidence of significant acute traumatic injury to the cervical spine. 2.  Multilevel degenerative disc disease and cervical spondylosis redemonstrated, as above.   Original Report Authenticated By: Trudie Reed, M.D.      1. Fall at nursing home   2. Dementia       MDM    Patient with dementia. No evidence of injuries here today did CT head neck and bilateral hip films to be on safe side and Desyrel negative. Patient cleared to be discharged back to nursing facility.     Shelda Jakes, MD 08/22/12 [id number]

## 2013-03-15 ENCOUNTER — Ambulatory Visit: Payer: Medicare Other | Admitting: Family

## 2013-03-20 ENCOUNTER — Emergency Department (HOSPITAL_COMMUNITY)
Admission: EM | Admit: 2013-03-20 | Discharge: 2013-03-20 | Disposition: A | Discharge status: Skilled Nursing Facility | Payer: Medicare Other | Attending: Emergency Medicine | Admitting: Emergency Medicine

## 2013-03-20 ENCOUNTER — Encounter (HOSPITAL_COMMUNITY): Payer: Self-pay | Admitting: *Deleted

## 2013-03-20 DIAGNOSIS — R1013 Epigastric pain: Secondary | ICD-10-CM | POA: Insufficient documentation

## 2013-03-20 DIAGNOSIS — Y9389 Activity, other specified: Secondary | ICD-10-CM | POA: Insufficient documentation

## 2013-03-20 DIAGNOSIS — Z79899 Other long term (current) drug therapy: Secondary | ICD-10-CM | POA: Insufficient documentation

## 2013-03-20 DIAGNOSIS — G8929 Other chronic pain: Secondary | ICD-10-CM | POA: Insufficient documentation

## 2013-03-20 DIAGNOSIS — S0990XA Unspecified injury of head, initial encounter: Secondary | ICD-10-CM | POA: Insufficient documentation

## 2013-03-20 DIAGNOSIS — E785 Hyperlipidemia, unspecified: Secondary | ICD-10-CM | POA: Insufficient documentation

## 2013-03-20 DIAGNOSIS — Y9210 Unspecified residential institution as the place of occurrence of the external cause: Secondary | ICD-10-CM | POA: Insufficient documentation

## 2013-03-20 DIAGNOSIS — S0003XA Contusion of scalp, initial encounter: Secondary | ICD-10-CM | POA: Insufficient documentation

## 2013-03-20 DIAGNOSIS — C9030 Solitary plasmacytoma not having achieved remission: Secondary | ICD-10-CM | POA: Insufficient documentation

## 2013-03-20 DIAGNOSIS — Y92129 Unspecified place in nursing home as the place of occurrence of the external cause: Secondary | ICD-10-CM

## 2013-03-20 DIAGNOSIS — S1093XA Contusion of unspecified part of neck, initial encounter: Secondary | ICD-10-CM | POA: Insufficient documentation

## 2013-03-20 DIAGNOSIS — M1990 Unspecified osteoarthritis, unspecified site: Secondary | ICD-10-CM | POA: Insufficient documentation

## 2013-03-20 DIAGNOSIS — W1809XA Striking against other object with subsequent fall, initial encounter: Secondary | ICD-10-CM | POA: Insufficient documentation

## 2013-03-20 DIAGNOSIS — Z87891 Personal history of nicotine dependence: Secondary | ICD-10-CM | POA: Insufficient documentation

## 2013-03-20 NOTE — ED Notes (Signed)
[employer] and spoke with med tech who reported that pt hit her head on the night stand when falling out of the bed this morning. On the assessment, pt without any injuries to her head. There is an old, healing bruise on right chin.

## 2013-03-20 NOTE — ED Notes (Signed)
NGE:[id number]<UX> Expected date:<BR> Expected time:<BR> Means of arrival:<BR> Comments:<BR> 72 y/o F, fall, no complaints, no obvious injury

## 2013-03-20 NOTE — ED Notes (Signed)
[facility] called to transport pt. Report given to [place] at [place].

## 2013-03-20 NOTE — ED Notes (Signed)
Per EMS pt coming from [facility] with c/o fall. Per EMS staff reported pt fell this am while staff was assisting pt to turn and pivot to sit in a wheelchair. Per EMS no LOC, no obvious injuries. Unknown if pt hit her head,Pt has hx of advanced dementia. Pt denies pain.

## 2013-03-20 NOTE — ED Provider Notes (Signed)
History     CSN: [id number]  Arrival date & time 03/20/13  4098   First MD Initiated Contact with Patient 03/20/13 [phone]      Chief Complaint  Patient presents with  . Fall    (Consider location/radiation/quality/duration/timing/severity/associated sxs/prior treatment) Patient is a 72 y.o. female presenting with fall. The history is provided by the nursing home. The history is limited by the condition of the patient.  Fall   patient to the nursing home while being transferred from the bed to a chair and according to the staff did strike the right side of her head without loss of consciousness. She does have a history of dementia and is at her baseline. No other injuries noted. No vomiting. She was transported by EMS for evaluation. She does not take any blood thinners at this time  Past Medical History  Diagnosis Date  . Dementia   . Osteoarthritis   . Epigastric pain   . Chronic pain   . Hyperlipidemia   . 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 Norovirus   . Osteopenia     History reviewed. No pertinent past surgical history.  Family History  Problem Relation Age of Onset  . Diabetes type II    . Multiple sclerosis    . Dementia      History  Substance Use Topics  . Smoking status: Former Games developer  . Smokeless tobacco: Not on file  . Alcohol Use: No    OB History   Grav Para Term Preterm Abortions TAB SAB Ect Mult Living                  Review of Systems  Unable to perform ROS   Allergies  Review of patient's allergies indicates no known allergies.  Home Medications   Current Outpatient Rx  Name  Route  Sig  Dispense  Refill  . acetaminophen (TYLENOL) 325 MG tablet   Oral   Take 650 mg by mouth every 6 (six) hours as needed. For pain         . Cranberry 500 MG CAPS   Oral   Take 1 capsule by mouth daily.         . divalproex (DEPAKOTE SPRINKLE) 125 MG capsule   Oral   Take 375 mg by mouth 2 (two) times daily.  Take three capsules (375 mg total) twice a day         . meloxicam (MOBIC) 15 MG tablet   Oral   Take 15 mg by mouth daily.         . risperiDONE (RISPERDAL) 0.25 MG tablet   Oral   Take 0.25 mg by mouth daily. Take at 5pm         . sertraline (ZOLOFT) 25 MG tablet   Oral   Take 25 mg by mouth daily.         . temazepam (RESTORIL) 7.5 MG capsule   Oral   Take 7.5 mg by mouth at bedtime.           BP 119/96  Pulse 71  Temp(Src) 99.1 F (37.3 C) (Oral)  Resp 25  SpO2 99%  Physical Exam  Nursing note and vitals reviewed. Constitutional: She is oriented to person, place, and time. She appears well-developed and well-nourished.  Non-toxic appearance. No distress.  HENT:  Head: Normocephalic and atraumatic.    No evidence of scalp hematoma  Eyes: Conjunctivae, EOM and lids are  normal. Pupils are equal, round, and reactive to light.  Neck: Normal range of motion. Neck supple. No tracheal deviation present. No mass present.  Cardiovascular: Normal rate, regular rhythm and normal heart sounds.  Exam reveals no gallop.   No murmur heard. Pulmonary/Chest: Effort normal and breath sounds normal. No stridor. No respiratory distress. She has no decreased breath sounds. She has no wheezes. She has no rhonchi. She has no rales.  Abdominal: Soft. Normal appearance and bowel sounds are normal. She exhibits no distension. There is no tenderness. There is no rebound and no CVA tenderness.  Musculoskeletal: Normal range of motion. She exhibits no edema and no tenderness.  Neurological: She is alert and oriented to person, place, and time. She has normal strength. No cranial nerve deficit or sensory deficit. GCS eye subscore is 4. GCS verbal subscore is 5. GCS motor subscore is 6.  Skin: Skin is warm and dry. No abrasion and no rash noted.  Psychiatric: She has a normal mood and affect. Her speech is normal and behavior is normal.    ED Course  Procedures (including critical care  time)  Labs Reviewed - No data to display No results found.   No diagnosis found.    MDM  Patient is without signs of cranial trauma. She does have an old bruise at her right jaw. No other bruising noted on her body. We'll not do xrays at this time and discharge patient        Toy Baker, MD 03/20/13 [phone]

## 2015-11-09 ENCOUNTER — Encounter (HOSPITAL_COMMUNITY): Payer: Self-pay | Admitting: *Deleted

## 2015-11-09 ENCOUNTER — Emergency Department (HOSPITAL_COMMUNITY)
Admission: EM | Admit: 2015-11-09 | Discharge: 2015-11-09 | Disposition: A | Discharge status: Home / Self Care | Payer: Medicare Other | Attending: Emergency Medicine | Admitting: Emergency Medicine

## 2015-11-09 DIAGNOSIS — Y998 Other external cause status: Secondary | ICD-10-CM | POA: Diagnosis not present

## 2015-11-09 DIAGNOSIS — Z8579 Personal history of other malignant neoplasms of lymphoid, hematopoietic and related tissues: Secondary | ICD-10-CM | POA: Diagnosis not present

## 2015-11-09 DIAGNOSIS — Z79899 Other long term (current) drug therapy: Secondary | ICD-10-CM | POA: Insufficient documentation

## 2015-11-09 DIAGNOSIS — G8929 Other chronic pain: Secondary | ICD-10-CM | POA: Insufficient documentation

## 2015-11-09 DIAGNOSIS — M1990 Unspecified osteoarthritis, unspecified site: Secondary | ICD-10-CM | POA: Insufficient documentation

## 2015-11-09 DIAGNOSIS — Z043 Encounter for examination and observation following other accident: Secondary | ICD-10-CM | POA: Insufficient documentation

## 2015-11-09 DIAGNOSIS — Y9389 Activity, other specified: Secondary | ICD-10-CM | POA: Diagnosis not present

## 2015-11-09 DIAGNOSIS — Y92129 Unspecified place in nursing home as the place of occurrence of the external cause: Secondary | ICD-10-CM | POA: Insufficient documentation

## 2015-11-09 DIAGNOSIS — Z8619 Personal history of other infectious and parasitic diseases: Secondary | ICD-10-CM | POA: Diagnosis not present

## 2015-11-09 DIAGNOSIS — W1839XA Other fall on same level, initial encounter: Secondary | ICD-10-CM | POA: Diagnosis not present

## 2015-11-09 DIAGNOSIS — Z87891 Personal history of nicotine dependence: Secondary | ICD-10-CM | POA: Insufficient documentation

## 2015-11-09 DIAGNOSIS — F0390 Unspecified dementia without behavioral disturbance: Secondary | ICD-10-CM | POA: Diagnosis not present

## 2015-11-09 DIAGNOSIS — W19XXXA Unspecified fall, initial encounter: Secondary | ICD-10-CM

## 2015-11-09 NOTE — Progress Notes (Signed)
CSW attempted to speak with patient at bedside. CSW was unable to assess patient at this time. CSW asked patient two questions and patient did not respond. Patient was mumble something while holding the cover sheet in her hand.   Genice Rouge [id number] ED CSW 11/09/2015 2:00 PM

## 2015-11-09 NOTE — ED Notes (Signed)
Bed: WA21 Expected date:  Expected time:  Means of arrival:  Comments: EMS- Eval post fall, no injury

## 2015-11-09 NOTE — ED Notes (Signed)
Per EMS-staff found her laying on mat beside bed around 0330-placed back in bed, upon first shift's arrival patient had already gotten up with assistance in wheelchair at breakfast for eval per nursing home-per nursing home she is at baseline physically and mentally-no obvious deformities-patient at norm

## 2015-11-09 NOTE — ED Provider Notes (Signed)
CSN: [id number]     Arrival date & time 11/09/15  1203 History   First MD Initiated Contact with Patient 11/09/15 1244     Chief Complaint  Patient presents with  . Fall     (Consider location/radiation/quality/duration/timing/severity/associated sxs/prior Treatment) HPI  A LEVEL 5 CAVEAT PERTAINS DUE TO DEMENTIA Pt presents after being found next to her bed in her nursing facility this morning at 3am.  Hx obtained via EMS and nursing facility report.  She was presumed to have fallen.  Did not have evidence of injury, no pain.  She was placed in her wheelchair this morning and has eaten breakfast normally.  Per protocol she was sent to the ED for evaluation.  Pt is reported to be at her mental and physical baseline.    Past Medical History  Diagnosis Date  . Dementia   . Osteoarthritis   . Epigastric pain   . Chronic pain   . Hyperlipidemia   . 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 Norovirus   . Osteopenia    History reviewed. No pertinent past surgical history. Family History  Problem Relation Age of Onset  . Diabetes type II    . Multiple sclerosis    . Dementia     Social History  Substance Use Topics  . Smoking status: Former Research scientist (life sciences)  . Smokeless tobacco: None  . Alcohol Use: No   OB History    No data available     Review of Systems  UNABLE TO OBTAIN ROS DUE TO LEVEL 5 CAVEAT    Allergies  Review of patient's allergies indicates no known allergies.  Home Medications   Prior to Admission medications   Medication Sig Start Date End Date Taking? Authorizing Provider  citalopram (CELEXA) 10 MG tablet Take 10 mg by mouth daily.   Yes Historical Provider, MD  Cranberry 450 MG TABS Take 1 tablet by mouth 2 (two) times daily.   Yes Historical Provider, MD  divalproex (DEPAKOTE SPRINKLE) 125 MG capsule Take 125 mg by mouth 2 (two) times daily.    Yes Historical Provider, MD  hydrocortisone (ANUSOL-HC) 2.5 % rectal cream Place 1  application rectally every 6 (six) hours as needed for hemorrhoids or itching.   Yes Historical Provider, MD  loperamide (IMODIUM A-D) 2 MG tablet Take 2 mg by mouth 4 (four) times daily as needed for diarrhea or loose stools.   Yes Historical Provider, MD  Multiple Vitamin (DAILY VITE) TABS Take 1 tablet by mouth daily.   Yes Historical Provider, MD  nystatin (MYCOSTATIN/NYSTOP) 100000 UNIT/GM POWD Apply 1 Bottle topically 3 (three) times daily.  11/07/15  Yes Historical Provider, MD  senna (SENOKOT) 8.6 MG TABS Take 1 tablet by mouth at bedtime.    Yes Historical Provider, MD  acetaminophen (TYLENOL) 325 MG tablet Take 650 mg by mouth every 6 (six) hours as needed. For pain    Historical Provider, MD   BP 97/55 mmHg  Pulse 91  Temp(Src) 98.7 F (37.1 C) (Oral)  Resp 18  SpO2 98%  Vitals reviewed Physical Exam  Physical Examination: General appearance - alert, well appearing, and in no distress Mental status - alert, oriented to person, not to place or time Head- NCAT Eyes - no conjunctival injection no scleral icterus Mouth - mucous membranes moist, pharynx normal without lesions  Neck- no midline tenderness to palpation Chest - clear to auscultation, no wheezes, rales or rhonchi, symmetric air entry Heart -  normal rate, regular rhythm, normal S1, S2, no murmurs, rubs, clicks or gallops Abdomen - soft, nontender, nondistended, no masses or organomegaly Back exam - no midline tenderness to palpation Neurological - alert, oriented only to self, moving all extremities, extremities with some degree of muscle contractures Extremities - peripheral pulses normal, no pedal edema, no clubbing or cyanosis Skin - normal coloration and turgor, no rashes  ED Course  Procedures (including critical care time) Labs Review Labs Reviewed - No data to display  Imaging Review No results found. I have personally reviewed and evaluated these images and lab results as part of my medical  decision-making.   EKG Interpretation None      MDM   Final diagnoses:  Fall, initial encounter    Pt presenting after fall, no evidence of injuries.  Fall occurred approx 8 hours prior to arrival and patient continues to be at her baseline.  Pt discharged back to nursing facility.      Alfonzo Beers, MD 11/09/15 [phone]

## 2015-11-09 NOTE — Discharge Instructions (Signed)
Return to the ED with any concerns including vomiting, seizure activity, difficulty breathing, fainting, decreased level of alertness/lethargy, or any other alarming symptoms °

## 2016-11-08 ENCOUNTER — Encounter (HOSPITAL_COMMUNITY): Payer: Self-pay

## 2016-11-08 ENCOUNTER — Emergency Department (HOSPITAL_COMMUNITY): Payer: Medicare Other

## 2016-11-08 ENCOUNTER — Inpatient Hospital Stay (HOSPITAL_COMMUNITY)
Admission: EM | Admit: 2016-11-08 | Discharge: 2016-11-12 | DRG: 871 | Disposition: A | Discharge status: Skilled Nursing Facility | Payer: Medicare Other | Attending: Internal Medicine | Admitting: Internal Medicine

## 2016-11-08 DIAGNOSIS — R32 Unspecified urinary incontinence: Secondary | ICD-10-CM | POA: Diagnosis present

## 2016-11-08 DIAGNOSIS — A419 Sepsis, unspecified organism: Principal | ICD-10-CM

## 2016-11-08 DIAGNOSIS — M8580 Other specified disorders of bone density and structure, unspecified site: Secondary | ICD-10-CM | POA: Diagnosis present

## 2016-11-08 DIAGNOSIS — Z7401 Bed confinement status: Secondary | ICD-10-CM | POA: Diagnosis not present

## 2016-11-08 DIAGNOSIS — Z79899 Other long term (current) drug therapy: Secondary | ICD-10-CM | POA: Diagnosis not present

## 2016-11-08 DIAGNOSIS — E860 Dehydration: Secondary | ICD-10-CM | POA: Diagnosis present

## 2016-11-08 DIAGNOSIS — Z515 Encounter for palliative care: Secondary | ICD-10-CM | POA: Diagnosis not present

## 2016-11-08 DIAGNOSIS — Z87891 Personal history of nicotine dependence: Secondary | ICD-10-CM | POA: Diagnosis not present

## 2016-11-08 DIAGNOSIS — I10 Essential (primary) hypertension: Secondary | ICD-10-CM | POA: Diagnosis present

## 2016-11-08 DIAGNOSIS — R159 Full incontinence of feces: Secondary | ICD-10-CM | POA: Diagnosis present

## 2016-11-08 DIAGNOSIS — Y95 Nosocomial condition: Secondary | ICD-10-CM | POA: Diagnosis present

## 2016-11-08 DIAGNOSIS — R6520 Severe sepsis without septic shock: Secondary | ICD-10-CM | POA: Diagnosis present

## 2016-11-08 DIAGNOSIS — G309 Alzheimer's disease, unspecified: Secondary | ICD-10-CM | POA: Diagnosis present

## 2016-11-08 DIAGNOSIS — R1310 Dysphagia, unspecified: Secondary | ICD-10-CM | POA: Diagnosis present

## 2016-11-08 DIAGNOSIS — R000 Tachycardia, unspecified: Secondary | ICD-10-CM | POA: Diagnosis present

## 2016-11-08 DIAGNOSIS — L8990 Pressure ulcer of unspecified site, unspecified stage: Secondary | ICD-10-CM | POA: Insufficient documentation

## 2016-11-08 DIAGNOSIS — E870 Hyperosmolality and hypernatremia: Secondary | ICD-10-CM | POA: Diagnosis not present

## 2016-11-08 DIAGNOSIS — R509 Fever, unspecified: Secondary | ICD-10-CM | POA: Diagnosis not present

## 2016-11-08 DIAGNOSIS — Z7189 Other specified counseling: Secondary | ICD-10-CM | POA: Diagnosis not present

## 2016-11-08 DIAGNOSIS — J189 Pneumonia, unspecified organism: Secondary | ICD-10-CM | POA: Diagnosis present

## 2016-11-08 DIAGNOSIS — F0280 Dementia in other diseases classified elsewhere without behavioral disturbance: Secondary | ICD-10-CM | POA: Diagnosis present

## 2016-11-08 DIAGNOSIS — F329 Major depressive disorder, single episode, unspecified: Secondary | ICD-10-CM | POA: Diagnosis present

## 2016-11-08 DIAGNOSIS — R296 Repeated falls: Secondary | ICD-10-CM | POA: Diagnosis present

## 2016-11-08 DIAGNOSIS — N179 Acute kidney failure, unspecified: Secondary | ICD-10-CM

## 2016-11-08 DIAGNOSIS — E785 Hyperlipidemia, unspecified: Secondary | ICD-10-CM | POA: Diagnosis present

## 2016-11-08 HISTORY — DX: Other chronic cystitis without hematuria: N30.20

## 2016-11-08 HISTORY — DX: Dysphagia, unspecified: R13.10

## 2016-11-08 HISTORY — DX: Depression, unspecified: F32.A

## 2016-11-08 HISTORY — DX: Major depressive disorder, single episode, unspecified: F32.9

## 2016-11-08 LAB — CBC WITH DIFFERENTIAL/PLATELET
Basophils Absolute: 0 10*3/uL (ref 0.0–0.1)
Basophils Relative: 0 %
EOS ABS: 0 10*3/uL (ref 0.0–0.7)
Eosinophils Relative: 0 %
HEMATOCRIT: 45.7 % (ref 36.0–46.0)
HEMOGLOBIN: 13.8 g/dL (ref 12.0–15.0)
LYMPHS ABS: 2.1 10*3/uL (ref 0.7–4.0)
LYMPHS PCT: 14 %
MCH: 29.3 pg (ref 26.0–34.0)
MCHC: 30.2 g/dL (ref 30.0–36.0)
MCV: 97 fL (ref 78.0–100.0)
MONOS PCT: 7 %
Monocytes Absolute: 1 10*3/uL (ref 0.1–1.0)
NEUTROS ABS: 11.8 10*3/uL — AB (ref 1.7–7.7)
NEUTROS PCT: 79 %
Platelets: 150 10*3/uL (ref 150–400)
RBC: 4.71 MIL/uL (ref 3.87–5.11)
RDW: 15 % (ref 11.5–15.5)
WBC: 15 10*3/uL — AB (ref 4.0–10.5)

## 2016-11-08 LAB — COMPREHENSIVE METABOLIC PANEL
ALBUMIN: 3.4 g/dL — AB (ref 3.5–5.0)
ALT: 42 U/L (ref 14–54)
ANION GAP: 18 — AB (ref 5–15)
AST: 29 U/L (ref 15–41)
Alkaline Phosphatase: 107 U/L (ref 38–126)
BILIRUBIN TOTAL: 0.9 mg/dL (ref 0.3–1.2)
BUN: 67 mg/dL — AB (ref 6–20)
CO2: 25 mmol/L (ref 22–32)
Calcium: 9.2 mg/dL (ref 8.9–10.3)
Chloride: 126 mmol/L — ABNORMAL HIGH (ref 101–111)
Creatinine, Ser: 1.88 mg/dL — ABNORMAL HIGH (ref 0.44–1.00)
GFR calc Af Amer: 29 mL/min — ABNORMAL LOW (ref >60)
GFR calc non Af Amer: 25 mL/min — ABNORMAL LOW (ref >60)
GLUCOSE: 183 mg/dL — AB (ref 65–99)
POTASSIUM: 4.1 mmol/L (ref 3.5–5.1)
SODIUM: 169 mmol/L — AB (ref 135–145)
TOTAL PROTEIN: 8.1 g/dL (ref 6.5–8.1)

## 2016-11-08 LAB — I-STAT CG4 LACTIC ACID, ED
Lactic Acid, Venous: 3.31 mmol/L (ref 0.5–1.9)
Lactic Acid, Venous: 3 mmol/L (ref 0.5–1.9)

## 2016-11-08 LAB — NA AND K (SODIUM & POTASSIUM), RAND UR
Potassium Urine: 120 mmol/L
Sodium, Ur: <10 mmol/L

## 2016-11-08 LAB — BASIC METABOLIC PANEL
Anion gap: 10 (ref 5–15)
Anion gap: 13 (ref 5–15)
BUN: 62 mg/dL — ABNORMAL HIGH (ref 6–20)
BUN: 60 mg/dL — ABNORMAL HIGH (ref 6–20)
CALCIUM: 7.9 mg/dL — AB (ref 8.9–10.3)
CO2: 27 mmol/L (ref 22–32)
CO2: 25 mmol/L (ref 22–32)
CREATININE: 1.78 mg/dL — AB (ref 0.44–1.00)
CREATININE: 1.58 mg/dL — AB (ref 0.44–1.00)
Calcium: 7.7 mg/dL — ABNORMAL LOW (ref 8.9–10.3)
Chloride: 128 mmol/L — ABNORMAL HIGH (ref 101–111)
Chloride: 129 mmol/L — ABNORMAL HIGH (ref 101–111)
GFR calc non Af Amer: 27 mL/min — ABNORMAL LOW (ref >60)
GFR calc non Af Amer: 31 mL/min — ABNORMAL LOW (ref >60)
GFR, EST AFRICAN AMERICAN: 31 mL/min — AB (ref >60)
GFR, EST AFRICAN AMERICAN: 36 mL/min — AB (ref >60)
Glucose, Bld: 161 mg/dL — ABNORMAL HIGH (ref 65–99)
Glucose, Bld: 140 mg/dL — ABNORMAL HIGH (ref 65–99)
Potassium: 3.3 mmol/L — ABNORMAL LOW (ref 3.5–5.1)
Potassium: 3.5 mmol/L (ref 3.5–5.1)
SODIUM: 165 mmol/L — AB (ref 135–145)
SODIUM: 167 mmol/L — AB (ref 135–145)

## 2016-11-08 LAB — PROTIME-INR
INR: 1.31
Prothrombin Time: 16.4 seconds — ABNORMAL HIGH (ref 11.4–15.2)

## 2016-11-08 LAB — URINALYSIS, ROUTINE W REFLEX MICROSCOPIC
BILIRUBIN URINE: NEGATIVE
GLUCOSE, UA: NEGATIVE mg/dL
Hgb urine dipstick: NEGATIVE
Ketones, ur: NEGATIVE mg/dL
Leukocytes, UA: NEGATIVE
NITRITE: NEGATIVE
PH: 5 (ref 5.0–8.0)
Protein, ur: NEGATIVE mg/dL
SPECIFIC GRAVITY, URINE: 1.02 (ref 1.005–1.030)

## 2016-11-08 LAB — OSMOLALITY, URINE: Osmolality, Ur: 626 mOsm/kg (ref 300–900)

## 2016-11-08 LAB — INFLUENZA PANEL BY PCR (TYPE A & B)
INFLBPCR: NEGATIVE
Influenza A By PCR: NEGATIVE

## 2016-11-08 LAB — OSMOLALITY: Osmolality: 379 mOsm/kg (ref 275–295)

## 2016-11-08 LAB — MRSA PCR SCREENING: MRSA by PCR: NEGATIVE

## 2016-11-08 MED ORDER — POTASSIUM CHLORIDE IN NACL 20-0.45 MEQ/L-% IV SOLN
INTRAVENOUS | Status: DC
  Filled 2016-11-08 (×5): qty 1000

## 2016-11-08 MED ORDER — PANTOPRAZOLE SODIUM 40 MG IV SOLR
40.0000 mg | INTRAVENOUS | Status: DC
  Administered 2016-11-08 – 2016-11-11 (×4): 40 mg via INTRAVENOUS
  Filled 2016-11-08 (×4): qty 40

## 2016-11-08 MED ORDER — PIPERACILLIN-TAZOBACTAM 3.375 G IVPB
3.3750 g | Freq: Three times a day (TID) | INTRAVENOUS | Status: DC
  Administered 2016-11-08 – 2016-11-12 (×12): 3.375 g via INTRAVENOUS
  Filled 2016-11-08 (×11): qty 50

## 2016-11-08 MED ORDER — VANCOMYCIN HCL IN DEXTROSE 750-5 MG/150ML-% IV SOLN
750.0000 mg | INTRAVENOUS | Status: DC
  Administered 2016-11-09 – 2016-11-10 (×2): 750 mg via INTRAVENOUS
  Filled 2016-11-08 (×3): qty 150

## 2016-11-08 MED ORDER — ENOXAPARIN SODIUM 30 MG/0.3ML ~~LOC~~ SOLN
30.0000 mg | SUBCUTANEOUS | Status: DC
  Administered 2016-11-08: 30 mg via SUBCUTANEOUS
  Filled 2016-11-08: qty 0.3

## 2016-11-08 MED ORDER — ACETAMINOPHEN 325 MG PO TABS: 650.0000 mg | ORAL_TABLET | Freq: Four times a day (QID) | ORAL | Status: DC | PRN

## 2016-11-08 MED ORDER — KCL IN DEXTROSE-NACL 20-5-0.45 MEQ/L-%-% IV SOLN
INTRAVENOUS | Status: DC
  Administered 2016-11-08: 18:00:00 via INTRAVENOUS

## 2016-11-08 MED ORDER — PIPERACILLIN-TAZOBACTAM 3.375 G IVPB 30 MIN
3.3750 g | Freq: Once | INTRAVENOUS | Status: AC
  Administered 2016-11-08: 3.375 g via INTRAVENOUS
  Filled 2016-11-08: qty 50

## 2016-11-08 MED ORDER — SODIUM CHLORIDE 0.9 % IV BOLUS (SEPSIS)
1000.0000 mL | Freq: Once | INTRAVENOUS | Status: AC
  Administered 2016-11-08: 1000 mL via INTRAVENOUS

## 2016-11-08 MED ORDER — CEFEPIME HCL 1 G IJ SOLR: 1.0000 g | Freq: Three times a day (TID) | INTRAMUSCULAR | Status: DC

## 2016-11-08 MED ORDER — IPRATROPIUM-ALBUTEROL 0.5-2.5 (3) MG/3ML IN SOLN
3.0000 mL | Freq: Four times a day (QID) | RESPIRATORY_TRACT | Status: DC
  Administered 2016-11-08 – 2016-11-09 (×3): 3 mL via RESPIRATORY_TRACT
  Filled 2016-11-08 (×3): qty 3

## 2016-11-08 MED ORDER — DEXTROSE 5 % IV SOLN
INTRAVENOUS | Status: DC
  Administered 2016-11-08: 14:00:00 via INTRAVENOUS

## 2016-11-08 MED ORDER — VANCOMYCIN HCL IN DEXTROSE 1-5 GM/200ML-% IV SOLN
1000.0000 mg | Freq: Once | INTRAVENOUS | Status: DC
  Filled 2016-11-08: qty 200

## 2016-11-08 MED ORDER — SODIUM CHLORIDE 0.9 % IV SOLN: INTRAVENOUS | Status: DC

## 2016-11-08 MED ORDER — POTASSIUM CL IN DEXTROSE 5% 20 MEQ/L IV SOLN
20.0000 meq | INTRAVENOUS | Status: DC
  Administered 2016-11-08 – 2016-11-09 (×2): 20 meq via INTRAVENOUS
  Filled 2016-11-08 (×6): qty 1000

## 2016-11-08 MED ORDER — SODIUM CHLORIDE 0.9 % IV SOLN
INTRAVENOUS | Status: DC
  Administered 2016-11-08: 14:00:00 via INTRAVENOUS

## 2016-11-08 NOTE — Progress Notes (Signed)
Pharmacy Antibiotic Note  Shari Dean is a 76 y.o. female admitted on 11/08/2016 with sepsis.  Pharmacy has been consulted for Vancomycin & Zosyn dosing. Vancomycin 1 GM and Zosyn 3.375 GM given in ED  Plan: Vancomycin 750 IV every 24 hours.  Goal trough 15-20 mcg/mL.  Zosyn 3.375 GM IV every 8 hours Labs per protocol  Height: 5\' 5"  (165.1 cm) Weight: 140 lb (63.5 kg) IBW/kg (Calculated) : 57  Temp (24hrs), Avg:101.2 F (38.4 C), Min:101.2 F (38.4 C), Max:101.2 F (38.4 C)   Recent Labs Lab 11/08/16 1023 11/08/16 1035  WBC 15.0*  --   CREATININE 1.88*  --   LATICACIDVEN  --  3.31*    Estimated Creatinine Clearance: 23.3 mL/min (by C-G formula based on SCr of 1.88 mg/dL (H)).    No Known Allergies  Antimicrobials this admission: Vancomycin 1/20 >>  Zosyn 1/20 >>      Thank you for allowing pharmacy to be a part of this patient's care.  Chriss Czar 11/08/2016 12:24 PM

## 2016-11-08 NOTE — ED Provider Notes (Signed)
[facility] DEPT Provider Note   CSN: [id number] Arrival date & time: 11/08/16  1000  By signing my name below, I, Shari Dean, attest that this documentation has been prepared under the direction and in the presence of Lajean Saver, MD.  Electronically Signed: Julien Nordmann, ED Scribe. 11/08/16. 10:39 AM.    History   Chief Complaint Chief Complaint  Patient presents with  . Fever   The history is provided by the EMS personnel and the nursing home. The history is limited by the condition of the patient. No language interpreter was used.   LEVEL V CAVEAT: HPI and ROS limited due to pt being non-verbal.  HPI Comments: Shari Dean is a 76 y.o. female brought in by ambulance, who has a PMhx of dementia, HLD, osteopenia, osteoarthritis, and chronic pain presents to the Emergency Department complaining of sudden onset fever (tmax 99.8 orally) x last night. Pt is non-verbal and non-ambulatory at baseline. Pt is currently a resident at [facility]. Per nursing staff, pt had a low grade fever this morning with white "frothy" sputum in her mouth. Son states this has been occurring intermittently for the past week. They express that pt has been pocketing food for one week and has been placed on a dysphagia diet. She is total care and is incontinent of bowel and bladder at baseline.    Past Medical History:  Diagnosis Date  . Chronic cystitis   . Chronic pain   . Dementia   . Depression   . Dysphagia   . Epigastric pain   . Hyperlipidemia   . Hyperlipidemia   . 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 Norovirus   . Osteoarthritis   . Osteopenia     Patient Active Problem List   Diagnosis Date Noted  . Fall at nursing home 07/19/2012  . UTI (urinary tract infection) 07/19/2012  . Dementia 07/17/2012  . Osteoarthritis 07/17/2012    History reviewed. No pertinent surgical history.  OB History    No data available        Home Medications    Prior to Admission medications   Medication Sig Start Date End Date Taking? Authorizing Provider  acetaminophen (TYLENOL) 325 MG tablet Take 650 mg by mouth every 6 (six) hours as needed. For pain    Historical Provider, MD  citalopram (CELEXA) 10 MG tablet Take 10 mg by mouth daily.    Historical Provider, MD  Cranberry 450 MG TABS Take 1 tablet by mouth 2 (two) times daily.    Historical Provider, MD  divalproex (DEPAKOTE SPRINKLE) 125 MG capsule Take 125 mg by mouth 2 (two) times daily.     Historical Provider, MD  hydrocortisone (ANUSOL-HC) 2.5 % rectal cream Place 1 application rectally every 6 (six) hours as needed for hemorrhoids or itching.    Historical Provider, MD  loperamide (IMODIUM A-D) 2 MG tablet Take 2 mg by mouth 4 (four) times daily as needed for diarrhea or loose stools.    Historical Provider, MD  Multiple Vitamin (DAILY VITE) TABS Take 1 tablet by mouth daily.    Historical Provider, MD  nystatin (MYCOSTATIN/NYSTOP) 100000 UNIT/GM POWD Apply 1 Bottle topically 3 (three) times daily.  11/07/15   Historical Provider, MD  senna (SENOKOT) 8.6 MG TABS Take 1 tablet by mouth at bedtime.     Historical Provider, MD    Family History Family History  Problem Relation Age of Onset  . Diabetes type  II    . Multiple sclerosis    . Dementia      Social History Social History  Substance Use Topics  . Smoking status: Former Research scientist (life sciences)  . Smokeless tobacco: Never Used  . Alcohol use No     Allergies   Patient has no known allergies.   Review of Systems Review of Systems  Unable to perform ROS: Patient nonverbal    LEVEL V CAVEAT: HPI and ROS limited due to pt being non-verbal  Physical Exam Updated Vital Signs BP (!) 200/162 (BP Location: Right Arm)   Pulse (!) 44   Temp 101.2 F (38.4 C) (Rectal)   Resp (!) 30   Wt 143 lb (64.9 kg)   SpO2 90%   BMI 26.16 kg/m   Physical Exam  Constitutional: She appears well-developed and  well-nourished. No distress.  Elderly, chronically ill appearing  HENT:  Head: Normocephalic and atraumatic.  Dry mucus membranes  Eyes: EOM are normal.  Neck: Normal range of motion.  Cardiovascular: Normal rate, regular rhythm and normal heart sounds.   Pulmonary/Chest: Effort normal. She has rhonchi.  Congestion and rhonchi throughout  Abdominal: Soft.  Musculoskeletal: Normal range of motion.  Skin: Skin is warm and dry.  Psychiatric: She has a normal mood and affect. Judgment normal.  Nursing note and vitals reviewed.    ED Treatments / Results  DIAGNOSTIC STUDIES: Oxygen Saturation is 90% on RA, low by my interpretation.  COORDINATION OF CARE:  10:35 AM Discussed treatment plan with pt at bedside and pt agreed to plan.  Labs (all labs ordered are listed, but only abnormal results are displayed) Results for orders placed or performed during the hospital encounter of 11/08/16  Culture, blood (Routine x 2)  Result Value Ref Range   Specimen Description BLOOD RIGHT ARM DRAWN BY IV THERAPY DRAWN BY RN    Special Requests BOTTLES DRAWN AEROBIC AND ANAEROBIC 6CC    Culture PENDING    Report Status PENDING   Culture, blood (Routine x 2)  Result Value Ref Range   Specimen Description BLOOD BLOOD LEFT WRIST    Special Requests BOTTLES DRAWN AEROBIC AND ANAEROBIC 6CC    Culture PENDING    Report Status PENDING   Comprehensive metabolic panel  Result Value Ref Range   Sodium 169 (HH) 135 - 145 mmol/L   Potassium 4.1 3.5 - 5.1 mmol/L   Chloride 126 (H) 101 - 111 mmol/L   CO2 25 22 - 32 mmol/L   Glucose, Bld 183 (H) 65 - 99 mg/dL   BUN 67 (H) 6 - 20 mg/dL   Creatinine, Ser 1.88 (H) 0.44 - 1.00 mg/dL   Calcium 9.2 8.9 - 10.3 mg/dL   Total Protein 8.1 6.5 - 8.1 g/dL   Albumin 3.4 (L) 3.5 - 5.0 g/dL   AST 29 15 - 41 U/L   ALT 42 14 - 54 U/L   Alkaline Phosphatase 107 38 - 126 U/L   Total Bilirubin 0.9 0.3 - 1.2 mg/dL   GFR calc non Af Amer 25 (L) >60 mL/min   GFR calc Af  Amer 29 (L) >60 mL/min   Anion gap 18 (H) 5 - 15  CBC with Differential  Result Value Ref Range   WBC 15.0 (H) 4.0 - 10.5 K/uL   RBC 4.71 3.87 - 5.11 MIL/uL   Hemoglobin 13.8 12.0 - 15.0 g/dL   HCT 45.7 36.0 - 46.0 %   MCV 97.0 78.0 - 100.0 fL   MCH 29.3 26.0 -  34.0 pg   MCHC 30.2 30.0 - 36.0 g/dL   RDW 15.0 11.5 - 15.5 %   Platelets 150 150 - 400 K/uL   Neutrophils Relative % 79 %   Neutro Abs 11.8 (H) 1.7 - 7.7 K/uL   Lymphocytes Relative 14 %   Lymphs Abs 2.1 0.7 - 4.0 K/uL   Monocytes Relative 7 %   Monocytes Absolute 1.0 0.1 - 1.0 K/uL   Eosinophils Relative 0 %   Eosinophils Absolute 0.0 0.0 - 0.7 K/uL   Basophils Relative 0 %   Basophils Absolute 0.0 0.0 - 0.1 K/uL  Protime-INR  Result Value Ref Range   Prothrombin Time 16.4 (H) 11.4 - 15.2 seconds   INR 1.31   Urinalysis, Routine w reflex microscopic  Result Value Ref Range   Color, Urine AMBER (A) YELLOW   APPearance CLEAR CLEAR   Specific Gravity, Urine 1.020 1.005 - 1.030   pH 5.0 5.0 - 8.0   Glucose, UA NEGATIVE NEGATIVE mg/dL   Hgb urine dipstick NEGATIVE NEGATIVE   Bilirubin Urine NEGATIVE NEGATIVE   Ketones, ur NEGATIVE NEGATIVE mg/dL   Protein, ur NEGATIVE NEGATIVE mg/dL   Nitrite NEGATIVE NEGATIVE   Leukocytes, UA NEGATIVE NEGATIVE  I-Stat CG4 Lactic Acid, ED  Result Value Ref Range   Lactic Acid, Venous 3.31 (HH) 0.5 - 1.9 mmol/L   Comment NOTIFIED PHYSICIAN    Dg Chest Portable 1 View  Result Date: 11/08/2016 CLINICAL DATA:  Short of breath EXAM: PORTABLE CHEST 1 VIEW COMPARISON:  07/17/2012 FINDINGS: There is hazy airspace disease at the left base. Lungs otherwise clear. Normal heart size. No pneumothorax. IMPRESSION: Hazy left lower lobe airspace disease. Followup PA and lateral chest X-ray is recommended in 3-4 weeks following trial of antibiotic therapy to ensure resolution and exclude underlying malignancy. Electronically Signed   By: Marybelle Killings M.D.   On: 11/08/2016 10:42    EKG  EKG  Interpretation None       Radiology Dg Chest Portable 1 View  Result Date: 11/08/2016 CLINICAL DATA:  Short of breath EXAM: PORTABLE CHEST 1 VIEW COMPARISON:  07/17/2012 FINDINGS: There is hazy airspace disease at the left base. Lungs otherwise clear. Normal heart size. No pneumothorax. IMPRESSION: Hazy left lower lobe airspace disease. Followup PA and lateral chest X-ray is recommended in 3-4 weeks following trial of antibiotic therapy to ensure resolution and exclude underlying malignancy. Electronically Signed   By: Marybelle Killings M.D.   On: 11/08/2016 10:42    Procedures Procedures (including critical care time)  Medications Ordered in ED Medications - No data to display   Initial Impression / Assessment and Plan / ED Course  I have reviewed the triage vital signs and the nursing notes.  Pertinent labs & imaging results that were available during my care of the patient were reviewed by me and considered in my medical decision making (see chart for details).  Iv ns. Labs. Cultures. Continuous pulse ox and monitor.   Patient w suspected sepsis. Also appears dehydrated.  Iv ns boluses. Iv abx.  CRITICAL CARE Performed by: Mirna Mires Total critical care time: 35 minutes Critical care time was exclusive of separately billable procedures and treating other patients. Critical care was necessary to treat or prevent imminent or life-threatening deterioration. Critical care was time spent personally by me on the following activities: development of treatment plan with patient and/or surrogate as well as nursing, discussions with consultants, evaluation of patient's response to treatment, examination of patient, obtaining history from  patient or surrogate, ordering and performing treatments and interventions, ordering and review of laboratory studies, ordering and review of radiographic studies, pulse oximetry and re-evaluation of patient's condition.  Additional iv  fluids.  Recheck, no change in exam. bp stable.   Hospitalist consulted for admission/stepdown.      Final Clinical Impressions(s) / ED Diagnoses   Final diagnoses:  None   I personally performed the services described in this documentation, which was scribed in my presence. The recorded information has been reviewed and considered. Lajean Saver, MD   New Prescriptions New Prescriptions   No medications on file     Lajean Saver, MD 11/08/16 1147

## 2016-11-08 NOTE — ED Notes (Signed)
2nd blood culture drawn by Kennyth Lose, RN with 2nd IV start.

## 2016-11-08 NOTE — H&P (Signed)
History and Physical    Shari Dean [id number] DOB: 09/20/1941 DOA: 11/08/2016  PCP: Madelyn Brunner, MD  Patient coming from:SNF  Chief Complaint:fever, cough  HPI: Shari Dean is a 76 y.o. female with medical history significant of dementia, frequent falls, dyslipidemia, osteoarthritis, nonverbal, nursing home resident presented with fever and cough worsened from last night. Patient is not verbal and bed bound therefore history is unreliable. Patient currently resides at [facility]. As per nursing home, patient had fever this morning associated with frothy sputum. Patient had difficulty swallowing and pocketing food. She is on dysphagia diet. Patient is total care and is incontinent of bowel and bladder at baseline. History is unreliable. ED Course: In the ER patient was found to be septic with fever, leukocytosis, abnormal x-ray finding, tachycardia. Cultures were sent. X-ray with possible pneumonia. He started on Vanco and Zosyn. Admitted for further evaluation. Patient's son and daughter at bedside.  Review of Systems: Unable to obtain review of system for the patient.   Past Medical History:  Diagnosis Date  . Chronic cystitis   . Chronic pain   . Dementia   . Depression   . Dysphagia   . Epigastric pain   . Hyperlipidemia   . Hyperlipidemia   . 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 Norovirus   . Osteoarthritis   . Osteopenia     History reviewed. No pertinent surgical history.   Social history: reports that she has quit smoking. She has never used smokeless tobacco. She reports that she does not drink alcohol or use drugs.  No Known Allergies no known drug allergies  Family History  Problem Relation Age of Onset  . Diabetes type II    . Multiple sclerosis    . Dementia       Prior to Admission medications   Medication Sig Start Date End Date Taking? Authorizing Provider  acetaminophen (TYLENOL) 325  MG tablet Take 650 mg by mouth every 6 (six) hours as needed. For pain   Yes Historical Provider, MD  senna (SENOKOT) 8.6 MG TABS Take 1 tablet by mouth at bedtime.    Yes Historical Provider, MD  sertraline (ZOLOFT) 25 MG tablet Take 25 mg by mouth daily.   Yes Historical Provider, MD    Physical Exam: Vitals:   11/08/16 1123 11/08/16 1130 11/08/16 1202 11/08/16 1204  BP:  120/61 138/74   Pulse: (!) 123  114   Resp: 24  22   Temp:      TempSrc:      SpO2: 94%  96%   Weight:    63.5 kg (140 lb)  Height:    5\' 5"  (1.651 m)      Constitutional: Ill-looking female lying on bed comfortable, very minimally responsive to painful stimuli .  Vitals:   11/08/16 1123 11/08/16 1130 11/08/16 1202 11/08/16 1204  BP:  120/61 138/74   Pulse: (!) 123  114   Resp: 24  22   Temp:      TempSrc:      SpO2: 94%  96%   Weight:    63.5 kg (140 lb)  Height:    5\' 5"  (1.651 m)   Eyes:Not opening eyes  ENMT: Mucous membranes arevery dry  Neck: normal Respiratory:Bibasal crackles, no wheezing . Normal respiratory effort. No accessory muscle use.  Cardiovascular: Regular rate and rhythm, no murmurs / rubs / gallops. No extremity edema. 2+ pedal pulses.  Abdomen:Soft, nontender, nondistended. Bowel sound positive. Skin: no rashes, lesions, ulcers. No induration Neurologic:Very minimally responsive to painful stimuli.  Psychiatric:Unable to assess..    Labs on Admission: I have personally reviewed following labs and imaging studies  CBC:  Recent Labs Lab 11/08/16 1023  WBC 15.0*  NEUTROABS 11.8*  HGB 13.8  HCT 45.7  MCV 97.0  PLT [id number]   Basic Metabolic Panel:  Recent Labs Lab 11/08/16 1023  NA 169*  K 4.1  CL 126*  CO2 25  GLUCOSE 183*  BUN 67*  CREATININE 1.88*  CALCIUM 9.2   GFR: Estimated Creatinine Clearance: 23.3 mL/min (by C-G formula based on SCr of 1.88 mg/dL (H)). Liver Function Tests:  Recent Labs Lab 11/08/16 1023  AST 29  ALT 42  ALKPHOS 107  BILITOT 0.9   PROT 8.1  ALBUMIN 3.4*   No results for input(s): LIPASE, AMYLASE in the last 168 hours. No results for input(s): AMMONIA in the last 168 hours. Coagulation Profile:  Recent Labs Lab 11/08/16 1023  INR 1.31   Cardiac Enzymes: No results for input(s): CKTOTAL, CKMB, CKMBINDEX, TROPONINI in the last 168 hours. BNP (last 3 results) No results for input(s): PROBNP in the last 8760 hours. HbA1C: No results for input(s): HGBA1C in the last 72 hours. CBG: No results for input(s): GLUCAP in the last 168 hours. Lipid Profile: No results for input(s): CHOL, HDL, LDLCALC, TRIG, CHOLHDL, LDLDIRECT in the last 72 hours. Thyroid Function Tests: No results for input(s): TSH, T4TOTAL, FREET4, T3FREE, THYROIDAB in the last 72 hours. Anemia Panel: No results for input(s): VITAMINB12, FOLATE, FERRITIN, TIBC, IRON, RETICCTPCT in the last 72 hours. Urine analysis:    Component Value Date/Time   COLORURINE AMBER (A) 11/08/2016 1011   APPEARANCEUR CLEAR 11/08/2016 1011   LABSPEC 1.020 11/08/2016 1011   PHURINE 5.0 11/08/2016 1011   GLUCOSEU NEGATIVE 11/08/2016 1011   HGBUR NEGATIVE 11/08/2016 [place] 11/08/2016 1011   KETONESUR NEGATIVE 11/08/2016 1011   PROTEINUR NEGATIVE 11/08/2016 1011   UROBILINOGEN 1.0 07/18/2012 1714   NITRITE NEGATIVE 11/08/2016 1011   LEUKOCYTESUR NEGATIVE 11/08/2016 1011   Sepsis Labs: !!!!!!!!!!!!!!!!!!!!!!!!!!!!!!!!!!!!!!!!!!!! @LABRCNTIP (procalcitonin:4,lacticidven:4) ) Recent Results (from the past 240 hour(s))  Culture, blood (Routine x 2)     Status: None (Preliminary result)   Collection Time: 11/08/16 10:24 AM  Result Value Ref Range Status   Specimen Description BLOOD RIGHT ARM DRAWN BY IV THERAPY DRAWN BY RN  Final   Special Requests BOTTLES DRAWN AEROBIC AND ANAEROBIC 6CC  Final   Culture PENDING  Incomplete   Report Status PENDING  Incomplete  Culture, blood (Routine x 2)     Status: None (Preliminary result)   Collection  Time: 11/08/16 10:54 AM  Result Value Ref Range Status   Specimen Description BLOOD BLOOD LEFT WRIST  Final   Special Requests BOTTLES DRAWN AEROBIC AND ANAEROBIC 6CC  Final   Culture PENDING  Incomplete   Report Status PENDING  Incomplete     Radiological Exams on Admission: Dg Chest Portable 1 View  Result Date: 11/08/2016 CLINICAL DATA:  Short of breath EXAM: PORTABLE CHEST 1 VIEW COMPARISON:  07/17/2012 FINDINGS: There is hazy airspace disease at the left base. Lungs otherwise clear. Normal heart size. No pneumothorax. IMPRESSION: Hazy left lower lobe airspace disease. Followup PA and lateral chest X-ray is recommended in 3-4 weeks following trial of antibiotic therapy to ensure resolution and exclude underlying malignancy. Electronically Signed   By: Marybelle Killings M.D.   On:  11/08/2016 10:42    EKG: Independently reviewed. Sinus tachycardia with heart rate of 140s.  Assessment/Plan Active Problems:   Healthcare-associated pneumonia  # Sepsis due to healthcare associated pneumonia: Patient with fever, leukocytosis, tachycardia, elevated lactate, chest x-ray with left lower lobe airspace disease consistent with pneumonia. -Cultures were sent by ER. Follow up culture results. Continue vancomycin and IV Zosyn. Risk of aspiration pneumonia because of dysphagia. -Keep nothing by mouth, IV dextrose, IV Protonix. -Speech and swallow evaluation -Continue supportive care.  #Hypernatremia: Patient looks very dry on physical exam. She was not eating or drinking well for last few days. I will continue normal saline IV fluid. Check BMP at least twice a day. Avoid rapid correction.  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 Patient usually nonverbal and bedbound. Continue supportive care. -Social worker evaluation -Fall precaution.  #Goals of care discussion: Because of advanced dementia bedbound, poor  functional status I discussed the goals of care with the patient's son and daughter at bedside in the ER. They want full code this time. -Palliative care consult requested.  DVT prophylaxis: Lovenox subcutaneous  Code Status: Full code  Family Communication:Discussed with the patient's son and daughter at bedside  Disposition Plan:Admit to stepdown. Patient's heart rate was around 140s in ER.  Consults called:No  Admission status:Inpatient.   Dron Tanna Furry MD [facility] Pager [phone]  If 7PM-7AM, please contact night-coverage [email or web address] Password [email or web address]  11/08/2016, 1:46 PM

## 2016-11-08 NOTE — ED Notes (Signed)
CRITICAL VALUE ALERT  Critical value received:  Na 169  Date of notification:  11/08/2016  Time of notification:  [id number]  Critical value read back:Yes.    Nurse who received alert:  [facility] RN  MD notified (1st page):  Dr. Ashok Cordia  Time of first page:  1134  MD notified (2nd page):  Time of second page:  Responding MD:  Dr. Ashok Cordia  Time MD responded:  1134

## 2016-11-08 NOTE — ED Triage Notes (Signed)
Per [facility] staff, pt had low grade fever of 99.8 this morning and had white frothy sputum in mouth.  Reports no history of chf or pulmonary edema but does have dysphagia and has been "pocketing food" for the past week.  Reports HR 133, bp 145/83, 18rr, and o2 sat 93%.  Staff says pt's mental status is baseline for her.  Reports pt is nonverbal, total care, and incontinent of bowel and bladder.  Staff gets pt up to a wheelchair, pt is nonambulatory.

## 2016-11-08 NOTE — Progress Notes (Addendum)
CRITICAL VALUE ALERT  Critical value received:  Na 165  Date of notification:  11/08/16  Time of notification:  1523  Critical value read back: Yes  Nurse who received alert:  Irving Shows, RN   MD notified (1st page):  Dr. Carolin Sicks  Time of first page: 1525  MD notified (2nd page):  Time of second page:  Responding MD:  Dr. Carolin Sicks  Time MD responded:1535

## 2016-11-08 NOTE — ED Notes (Signed)
1st blood culture drawn by Norm Salt, RN with IV start.

## 2016-11-09 DIAGNOSIS — N179 Acute kidney failure, unspecified: Secondary | ICD-10-CM

## 2016-11-09 DIAGNOSIS — L8990 Pressure ulcer of unspecified site, unspecified stage: Secondary | ICD-10-CM | POA: Insufficient documentation

## 2016-11-09 LAB — BASIC METABOLIC PANEL
ANION GAP: 12 (ref 5–15)
ANION GAP: 11 (ref 5–15)
BUN: 48 mg/dL — ABNORMAL HIGH (ref 6–20)
BUN: 34 mg/dL — AB (ref 6–20)
CALCIUM: 7.5 mg/dL — AB (ref 8.9–10.3)
CHLORIDE: 115 mmol/L — AB (ref 101–111)
CO2: 20 mmol/L — ABNORMAL LOW (ref 22–32)
CO2: 21 mmol/L — AB (ref 22–32)
CREATININE: 1.32 mg/dL — AB (ref 0.44–1.00)
Calcium: 7.7 mg/dL — ABNORMAL LOW (ref 8.9–10.3)
Chloride: 125 mmol/L — ABNORMAL HIGH (ref 101–111)
Creatinine, Ser: 1.18 mg/dL — ABNORMAL HIGH (ref 0.44–1.00)
GFR calc Af Amer: 51 mL/min — ABNORMAL LOW (ref >60)
GFR, EST AFRICAN AMERICAN: 45 mL/min — AB (ref >60)
GFR, EST NON AFRICAN AMERICAN: 38 mL/min — AB (ref >60)
GFR, EST NON AFRICAN AMERICAN: 44 mL/min — AB (ref >60)
GLUCOSE: 159 mg/dL — AB (ref 65–99)
GLUCOSE: 170 mg/dL — AB (ref 65–99)
POTASSIUM: 3.4 mmol/L — AB (ref 3.5–5.1)
Potassium: 3.8 mmol/L (ref 3.5–5.1)
SODIUM: 157 mmol/L — AB (ref 135–145)
Sodium: 147 mmol/L — ABNORMAL HIGH (ref 135–145)

## 2016-11-09 LAB — RESPIRATORY PANEL BY PCR
ADENOVIRUS-RVPPCR: NOT DETECTED
BORDETELLA PERTUSSIS-RVPCR: NOT DETECTED
CORONAVIRUS NL63-RVPPCR: NOT DETECTED
Chlamydophila pneumoniae: NOT DETECTED
Coronavirus 229E: NOT DETECTED
Coronavirus HKU1: NOT DETECTED
Coronavirus OC43: NOT DETECTED
Influenza A: NOT DETECTED
Influenza B: NOT DETECTED
METAPNEUMOVIRUS-RVPPCR: NOT DETECTED
Mycoplasma pneumoniae: NOT DETECTED
PARAINFLUENZA VIRUS 2-RVPPCR: NOT DETECTED
Parainfluenza Virus 1: NOT DETECTED
Parainfluenza Virus 3: NOT DETECTED
Parainfluenza Virus 4: NOT DETECTED
Respiratory Syncytial Virus: NOT DETECTED
Rhinovirus / Enterovirus: NOT DETECTED

## 2016-11-09 LAB — CBC
HCT: 35.9 % — ABNORMAL LOW (ref 36.0–46.0)
Hemoglobin: 10.6 g/dL — ABNORMAL LOW (ref 12.0–15.0)
MCH: 29 pg (ref 26.0–34.0)
MCHC: 29.5 g/dL — ABNORMAL LOW (ref 30.0–36.0)
MCV: 98.4 fL (ref 78.0–100.0)
PLATELETS: 113 10*3/uL — AB (ref 150–400)
RBC: 3.65 MIL/uL — ABNORMAL LOW (ref 3.87–5.11)
RDW: 15.2 % (ref 11.5–15.5)
WBC: 12.4 10*3/uL — ABNORMAL HIGH (ref 4.0–10.5)

## 2016-11-09 LAB — LACTIC ACID, PLASMA: LACTIC ACID, VENOUS: 2.8 mmol/L — AB (ref 0.5–1.9)

## 2016-11-09 LAB — STREP PNEUMONIAE URINARY ANTIGEN: STREP PNEUMO URINARY ANTIGEN: NEGATIVE

## 2016-11-09 MED ORDER — METOPROLOL TARTRATE 5 MG/5ML IV SOLN: 5.0000 mg | INTRAVENOUS | Status: DC | PRN

## 2016-11-09 MED ORDER — POTASSIUM CL IN DEXTROSE 5% 20 MEQ/L IV SOLN
20.0000 meq | INTRAVENOUS | Status: DC
  Administered 2016-11-09 – 2016-11-10 (×2): 20 meq via INTRAVENOUS
  Filled 2016-11-09 (×3): qty 1000

## 2016-11-09 MED ORDER — ENOXAPARIN SODIUM 40 MG/0.4ML ~~LOC~~ SOLN
40.0000 mg | SUBCUTANEOUS | Status: DC
  Administered 2016-11-09 – 2016-11-11 (×3): 40 mg via SUBCUTANEOUS
  Filled 2016-11-09 (×3): qty 0.4

## 2016-11-09 MED ORDER — IPRATROPIUM-ALBUTEROL 0.5-2.5 (3) MG/3ML IN SOLN
3.0000 mL | Freq: Two times a day (BID) | RESPIRATORY_TRACT | Status: DC
  Administered 2016-11-09 – 2016-11-11 (×5): 3 mL via RESPIRATORY_TRACT
  Filled 2016-11-09 (×5): qty 3

## 2016-11-09 NOTE — Progress Notes (Addendum)
PROGRESS NOTE    Shari Dean  [id number] DOB: 22-Aug-1941 DOA: 11/08/2016 PCP: Madelyn Brunner, MD   Brief Narrative: 76 y.o. female with medical history significant of dementia, frequent falls, dyslipidemia, osteoarthritis, nonverbal, nursing home resident presented with fever and cough. Patient is not verbal and bed bound. In the ER patient was found to be septic with fever, leukocytosis, abnormal x-ray finding, tachycardia. Cultures were sent. X-ray with possible pneumonia.   Assessment & Plan:   # Sepsis due to healthcare associated pneumonia: Patient with fever, leukocytosis, tachycardia, elevated lactate, chest x-ray with left lower lobe airspace disease consistent with pneumonia. -Follow up culture results. Continue empiric vancomycin and Zosyn for now. Patient has risk of aspiration pneumonia. Evaluated by swallow team currently on dysphagia level II diet. Continue IV fluid, Protonix and supportive care.  #hypernatremia due to dehydration: Serum sodium level improving with IV fluid. Continue IV fluid with half tenderness and potassium. Serum sodium level improved to 157 from 167. Continue to monitor twice a day. Avoid Corrections. Patient needs dextrose because of reduced oral intake. Continue to monitor for now.  #Acute kidney injury in the setting of dehydration. Serum creatinine level improving with IV fluid. Monitor BMP.  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 Patient usually nonverbal and bedbound. Continue supportive care. -Social worker evaluation -Fall precaution.  #Goals of care discussion: Because of advanced dementia bedbound, poor functional status I discussed the goals of care with the patient's son and daughter at bedside during admission. They want full code this time. -Palliative care consult requested. Active Problems:   Healthcare-associated pneumonia  Pressure injury of skin  DVT prophylaxis: Lovenox subcutaneous Code Status: Full code Family Communication: No family member present at bedside Disposition Plan: Transfer out of the step down to telemetry floor.    Consultants:   None  Procedures: None Antimicrobials: Vancomycin and Zosyn  Subjective: Patient was seen and examined at bedside. Patient was more alert and awake but nonverbal. This is her baseline. Review of system is limited.  Objective: Vitals:   11/09/16 0748 11/09/16 0800 11/09/16 0900 11/09/16 1000  BP:  110/60 127/69 (!) 128/102  Pulse:  (!) 105 (!) 108 (!) 110  Resp:  (!) 24 14 14   Temp:      TempSrc:      SpO2: 99% 100% 95% 97%  Weight:      Height:        Intake/Output Summary (Last 24 hours) at 11/09/16 1319 Last data filed at 11/08/16 1814  Gross per 24 hour  Intake            119.5 ml  Output                0 ml  Net            119.5 ml   Filed Weights   11/08/16 1204 11/08/16 1456 11/09/16 0459  Weight: 63.5 kg (140 lb) 59.6 kg (131 lb 6.3 oz) 62.7 kg (138 lb 3.7 oz)    Examination:  General exam: Elderly female looks more alert however not normal. Not in distress  Respiratory system: Clear to auscultation. Respiratory effort normal. No wheezing or crackle Cardiovascular system: S1 & S2 heard, RRR.  No pedal edema. Gastrointestinal system: Abdomen is soft, nontender, nondistended. Bowel sound positive. Central nervous system: Alert awake but not following commands. No focal deficits. Extremities: Not following commands Skin: No rashes, lesions or ulcers Psychiatry:  Unable to assess, patient is not following commands. This is her baseline.    Data Reviewed: I have personally reviewed following labs and imaging studies  CBC:  Recent Labs Lab 11/08/16 1023 11/09/16 0445  WBC 15.0* 12.4*  NEUTROABS 11.8*  --   HGB 13.8 10.6*  HCT 45.7 35.9*  MCV 97.0 98.4  PLT 150 123456*   Basic Metabolic Panel:  Recent Labs Lab  11/08/16 1023 11/08/16 1347 11/08/16 1654 11/09/16 0445  NA 169* 165* 167* 157*  K 4.1 3.3* 3.5 3.8  CL 126* 128* 129* 125*  CO2 25 27 25  20*  GLUCOSE 183* 161* 140* 159*  BUN 67* 62* 60* 48*  CREATININE 1.88* 1.78* 1.58* 1.32*  CALCIUM 9.2 7.7* 7.9* 7.5*   GFR: Estimated Creatinine Clearance: 32 mL/min (by C-G formula based on SCr of 1.32 mg/dL (H)). Liver Function Tests:  Recent Labs Lab 11/08/16 1023  AST 29  ALT 42  ALKPHOS 107  BILITOT 0.9  PROT 8.1  ALBUMIN 3.4*   No results for input(s): LIPASE, AMYLASE in the last 168 hours. No results for input(s): AMMONIA in the last 168 hours. Coagulation Profile:  Recent Labs Lab 11/08/16 1023  INR 1.31   Cardiac Enzymes: No results for input(s): CKTOTAL, CKMB, CKMBINDEX, TROPONINI in the last 168 hours. BNP (last 3 results) No results for input(s): PROBNP in the last 8760 hours. HbA1C: No results for input(s): HGBA1C in the last 72 hours. CBG: No results for input(s): GLUCAP in the last 168 hours. Lipid Profile: No results for input(s): CHOL, HDL, LDLCALC, TRIG, CHOLHDL, LDLDIRECT in the last 72 hours. Thyroid Function Tests: No results for input(s): TSH, T4TOTAL, FREET4, T3FREE, THYROIDAB in the last 72 hours. Anemia Panel: No results for input(s): VITAMINB12, FOLATE, FERRITIN, TIBC, IRON, RETICCTPCT in the last 72 hours. Sepsis Labs:  Recent Labs Lab 11/08/16 1035 11/08/16 1436 11/09/16 0445  LATICACIDVEN 3.31* 3.00* 2.8*    Recent Results (from the past 240 hour(s))  Culture, blood (Routine x 2)     Status: None (Preliminary result)   Collection Time: 11/08/16 10:24 AM  Result Value Ref Range Status   Specimen Description BLOOD RIGHT ARM DRAWN BY IV THERAPY DRAWN BY RN  Final   Special Requests BOTTLES DRAWN AEROBIC AND ANAEROBIC 6CC  Final   Culture PENDING  Incomplete   Report Status PENDING  Incomplete  Culture, blood (Routine x 2)     Status: None (Preliminary result)   Collection Time:  11/08/16 10:54 AM  Result Value Ref Range Status   Specimen Description BLOOD BLOOD LEFT WRIST  Final   Special Requests BOTTLES DRAWN AEROBIC AND ANAEROBIC 6CC  Final   Culture PENDING  Incomplete   Report Status PENDING  Incomplete  Respiratory Panel by PCR     Status: None   Collection Time: 11/08/16  1:39 PM  Result Value Ref Range Status   Adenovirus NOT DETECTED NOT DETECTED Final   Coronavirus 229E NOT DETECTED NOT DETECTED Final   Coronavirus HKU1 NOT DETECTED NOT DETECTED Final   Coronavirus NL63 NOT DETECTED NOT DETECTED Final   Coronavirus OC43 NOT DETECTED NOT DETECTED Final   Metapneumovirus NOT DETECTED NOT DETECTED Final   Rhinovirus / Enterovirus NOT DETECTED NOT DETECTED Final   Influenza A NOT DETECTED NOT DETECTED Final   Influenza B NOT DETECTED NOT DETECTED Final   Parainfluenza Virus 1 NOT DETECTED NOT DETECTED Final   Parainfluenza Virus 2 NOT DETECTED NOT DETECTED Final   Parainfluenza Virus 3 NOT  DETECTED NOT DETECTED Final   Parainfluenza Virus 4 NOT DETECTED NOT DETECTED Final   Respiratory Syncytial Virus NOT DETECTED NOT DETECTED Final   Bordetella pertussis NOT DETECTED NOT DETECTED Final   Chlamydophila pneumoniae NOT DETECTED NOT DETECTED Final   Mycoplasma pneumoniae NOT DETECTED NOT DETECTED Final    Comment: Performed at [facility], [place] [address] [place], [place] [address]  MRSA PCR Screening     Status: None   Collection Time: 11/08/16  3:35 PM  Result Value Ref Range Status   MRSA by PCR NEGATIVE NEGATIVE Final    Comment:        The GeneXpert MRSA Assay (FDA approved for NASAL specimens only), is one component of a comprehensive MRSA colonization surveillance program. It is not intended to diagnose MRSA infection nor to guide or monitor treatment for MRSA infections.          Radiology Studies: Dg Chest Portable 1 View  Result Date: 11/08/2016 CLINICAL DATA:  Short of breath EXAM: PORTABLE CHEST 1 VIEW COMPARISON:   07/17/2012 FINDINGS: There is hazy airspace disease at the left base. Lungs otherwise clear. Normal heart size. No pneumothorax. IMPRESSION: Hazy left lower lobe airspace disease. Followup PA and lateral chest X-ray is recommended in 3-4 weeks following trial of antibiotic therapy to ensure resolution and exclude underlying malignancy. Electronically Signed   By: Marybelle Killings M.D.   On: 11/08/2016 10:42        Scheduled Meds: . enoxaparin (LOVENOX) injection  40 mg Subcutaneous Q24H  . ipratropium-albuterol  3 mL Nebulization Q6H  . pantoprazole (PROTONIX) IV  40 mg Intravenous Q24H  . piperacillin-tazobactam (ZOSYN)  IV  3.375 g Intravenous Q8H  . vancomycin  1,000 mg Intravenous Once  . vancomycin  750 mg Intravenous Q24H   Continuous Infusions: . dextrose 5 % with KCl 20 mEq / L 20 mEq (11/09/16 1130)     LOS: 1 day    Dron Tanna Furry, MD [facility] Pager [phone]  If 7PM-7AM, please contact night-coverage [email or web address] Password [email or web address] 11/09/2016, 1:19 PM

## 2016-11-09 NOTE — Progress Notes (Signed)
Patient transferred to 300 dept via bed. Report given to nurse.

## 2016-11-09 NOTE — Evaluation (Signed)
Clinical/Bedside Swallow Evaluation Patient Details  Name: Shari Dean MRN: [id number] Date of Birth: April 05, 1941  Today's Date: 11/09/2016 Time: SLP Start Time (ACUTE ONLY): 0930 SLP Stop Time (ACUTE ONLY): 1015 SLP Time Calculation (min) (ACUTE ONLY): 45 min  Past Medical History:  Past Medical History:  Diagnosis Date  . Chronic cystitis   . Chronic pain   . Dementia   . Depression   . Dysphagia   . Epigastric pain   . Hyperlipidemia   . Hyperlipidemia   . 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 Norovirus   . Osteoarthritis   . Osteopenia    Past Surgical History: History reviewed. No pertinent surgical history. HPI:  Shari Dean a 76 y.o.femalewith medical history significant of dementia, frequent falls, dyslipidemia, osteoarthritis, nonverbal, nursing home resident presented with fever and cough worsened from last night. Patient is not verbal and bed bound therefore history is unreliable. Patient currently resides at [facility] home. As per nursing home, patient had fever this morning associated with frothy sputum. Patient had difficulty swallowing and pocketing food. She is on dysphagia diet. Patient is total care and is incontinent of bowel and bladder at baseline. History is unreliable. In the ER patient was found to be septic with fever, leukocytosis, abnormal x-ray finding, tachycardia. Cultures were sent. X-ray with possible pneumonia. He started on Vanco and Zosyn. Admitted for further evaluation. MD ordered BSE due to possible aspiration PNA. Chest x-ray shows:Hazy left lower lobe airspace disease.   Assessment / Plan / Recommendation Clinical Impression  Pt seen at bedside in ICU for BSE with several family members present. Pt typically resides at [facility] and consumes a soft diet with thin liquids. No objective assessment noted in EPIC, however family reports that Pt has been evaluated at bedside at [facility] in the past.  Pt received in bed with eyes closed. Oral care completed, which increased alertness. Pt unable to consistently follow commands due to advanced dementia, however she responded well to multimodality cues during oral feeding trials. Pt assessed with ice chips, water via cup and straw sips, puree, mech soft, and regular textures. Pt presents with suspected primarily cognitive based dysphagia due to advanced dementia characterized by decreased attention to task (oral feeding), inability to consistently follow commands (to clear oral residue), and delay in swallow initiation as observed by palpation. The above factors in combination with dependent feeder and bedbound status, place Pt at moderate risk for aspiration. SLP discussed this at length with family members. SLP reviewed careful hand feeding strategies in patient's with advanced dementia and reiterated importance of oral care before and after po. Recommend D2/chopped with thin liquids with careful feeding assist; po meds crushed in puree when pt is alert and upright.  Family voiced concern that oral hygiene is limited at SNF and SLP suggested that family request toothettes and soft bristled toothbrush from SNF staff and complete themselves during meals they are present (and also request that staff complete). Pt's brother inquired about "feeding tubes", stating that their sister who had MS had (what sounds like) TPN. SLP deferred discussions to MD (may also want Palliative care consult to assist), but stated that feeding tubes in individuals with advanced dementia do not decrease risk of aspiration and are often not beneficial in this population. Family acknowledged and was appreciative of information. SLP also encouraged family to ask SNF to elevate Pt's HOB slightly at night to decrease risk of aspiration from reflux (pt  reportedly frothing early AM at SNF prior to admission). SLP will follow during acute stay for diet tolerance and ongoing caregiver  education.    Aspiration Risk  Moderate aspiration risk    Diet Recommendation Dysphagia 2 (Fine chop);Thin liquid   Liquid Administration via: Cup;Straw Medication Administration: Crushed with puree Supervision: Staff to assist with self feeding;Full supervision/cueing for compensatory strategies Compensations: Slow rate (verbal cues to swallow) Postural Changes: Seated upright at 90 degrees;Remain upright for at least 30 minutes after po intake    Other  Recommendations Oral Care Recommendations: Oral care before and after PO;Oral care BID;Staff/trained caregiver to provide oral care Other Recommendations: Clarify dietary restrictions   Follow up Recommendations Skilled Nursing facility      Frequency and Duration min 2x/week  1 week       Prognosis Prognosis for Safe Diet Advancement: Fair Barriers to Reach Goals: Cognitive deficits      Swallow Study   General Date of Onset: 11/08/16 HPI: Shari Dean a 76 y.o.femalewith medical history significant of dementia, frequent falls, dyslipidemia, osteoarthritis, nonverbal, nursing home resident presented with fever and cough worsened from last night. Patient is not verbal and bed bound therefore history is unreliable. Patient currently resides at [facility] home. As per nursing home, patient had fever this morning associated with frothy sputum. Patient had difficulty swallowing and pocketing food. She is on dysphagia diet. Patient is total care and is incontinent of bowel and bladder at baseline. History is unreliable. In the ER patient was found to be septic with fever, leukocytosis, abnormal x-ray finding, tachycardia. Cultures were sent. X-ray with possible pneumonia. He started on Vanco and Zosyn. Admitted for further evaluation. MD ordered BSE due to possible aspiration PNA. Chest x-ray shows:Hazy left lower lobe airspace disease. Type of Study: Bedside Swallow Evaluation Previous Swallow Assessment: None on  record, but has been seen by SLP at SNF in past Diet Prior to this Study: NPO Temperature Spikes Noted: No Respiratory Status: Nasal cannula History of Recent Intubation: No Behavior/Cognition: Alert;Requires cueing Oral Cavity Assessment: Dry Oral Care Completed by SLP: Yes Oral Cavity - Dentition: Edentulous Vision: Impaired for self-feeding Self-Feeding Abilities: Total assist Patient Positioning: Upright in bed Baseline Vocal Quality: Normal Volitional Cough: Weak Volitional Swallow: Unable to elicit    Oral/Motor/Sensory Function Overall Oral Motor/Sensory Function: Mild impairment (decreased cognition)   Ice Chips Ice chips: Impaired Presentation: Spoon Oral Phase Impairments: Reduced lingual movement/coordination Oral Phase Functional Implications: Prolonged oral transit Pharyngeal Phase Impairments: Suspected delayed Swallow   Thin Liquid Thin Liquid: Within functional limits Presentation: Cup;Straw    Nectar Thick Nectar Thick Liquid: Not tested   Honey Thick Honey Thick Liquid: Not tested   Puree Puree: Impaired Presentation: Spoon Oral Phase Impairments: Reduced lingual movement/coordination Oral Phase Functional Implications: Prolonged oral transit Pharyngeal Phase Impairments: Suspected delayed Swallow   Solid     Solid: Impaired Presentation: Spoon Oral Phase Impairments: Reduced lingual movement/coordination Oral Phase Functional Implications: Prolonged oral transit;Oral residue;Left lateral sulci pocketing Pharyngeal Phase Impairments: Suspected delayed Swallow       Thank you,  Genene Churn, [place]  PORTER,DABNEY 11/09/2016,10:49 AM

## 2016-11-10 ENCOUNTER — Encounter (HOSPITAL_COMMUNITY): Payer: Self-pay | Admitting: Primary Care

## 2016-11-10 DIAGNOSIS — Z515 Encounter for palliative care: Secondary | ICD-10-CM

## 2016-11-10 DIAGNOSIS — Z7189 Other specified counseling: Secondary | ICD-10-CM

## 2016-11-10 LAB — BLOOD CULTURE ID PANEL (REFLEXED)
ACINETOBACTER BAUMANNII: NOT DETECTED
CANDIDA ALBICANS: NOT DETECTED
CANDIDA GLABRATA: NOT DETECTED
CANDIDA KRUSEI: NOT DETECTED
CANDIDA PARAPSILOSIS: NOT DETECTED
CANDIDA TROPICALIS: NOT DETECTED
ENTEROBACTER CLOACAE COMPLEX: NOT DETECTED
ENTEROBACTERIACEAE SPECIES: NOT DETECTED
ESCHERICHIA COLI: NOT DETECTED
Enterococcus species: NOT DETECTED
HAEMOPHILUS INFLUENZAE: NOT DETECTED
KLEBSIELLA OXYTOCA: NOT DETECTED
KLEBSIELLA PNEUMONIAE: NOT DETECTED
LISTERIA MONOCYTOGENES: NOT DETECTED
Neisseria meningitidis: NOT DETECTED
Proteus species: NOT DETECTED
Pseudomonas aeruginosa: NOT DETECTED
STREPTOCOCCUS PYOGENES: NOT DETECTED
Serratia marcescens: NOT DETECTED
Staphylococcus aureus (BCID): NOT DETECTED
Staphylococcus species: NOT DETECTED
Streptococcus agalactiae: NOT DETECTED
Streptococcus pneumoniae: NOT DETECTED
Streptococcus species: NOT DETECTED

## 2016-11-10 LAB — BASIC METABOLIC PANEL
ANION GAP: 8 (ref 5–15)
BUN: 25 mg/dL — ABNORMAL HIGH (ref 6–20)
CALCIUM: 7.6 mg/dL — AB (ref 8.9–10.3)
CHLORIDE: 113 mmol/L — AB (ref 101–111)
CO2: 23 mmol/L (ref 22–32)
Creatinine, Ser: 1.1 mg/dL — ABNORMAL HIGH (ref 0.44–1.00)
GFR calc Af Amer: 55 mL/min — ABNORMAL LOW (ref >60)
GFR calc non Af Amer: 48 mL/min — ABNORMAL LOW (ref >60)
GLUCOSE: 113 mg/dL — AB (ref 65–99)
Potassium: 3.6 mmol/L (ref 3.5–5.1)
Sodium: 144 mmol/L (ref 135–145)

## 2016-11-10 LAB — MAGNESIUM: Magnesium: 2.2 mg/dL (ref 1.7–2.4)

## 2016-11-10 MED ORDER — ENSURE ENLIVE PO LIQD
237.0000 mL | Freq: Two times a day (BID) | ORAL | Status: DC
  Administered 2016-11-11: 237 mL via ORAL

## 2016-11-10 MED ORDER — POTASSIUM CHLORIDE IN NACL 20-0.9 MEQ/L-% IV SOLN
INTRAVENOUS | Status: DC
  Administered 2016-11-10 – 2016-11-12 (×2): via INTRAVENOUS

## 2016-11-10 NOTE — Progress Notes (Signed)
Initial Nutrition Assessment   INTERVENTION:  Ensure Enlive po BID, each supplement provides 350 kcal and 20 grams of protein   Dysphagia 2 diet with thin liquids  Assist with feeding per ST/NSG evaluation   NUTRITION DIAGNOSIS:     Inadequate oral intake related to acute change in appetite AEB meal intake 0-25% with stable wt hx   GOAL:  Meet nutrition needs as able orally. Enteral nutrition not recommended.    MONITOR:  Po intake, labs and wt trends     REASON FOR ASSESSMENT:   Low Braden    ASSESSMENT:  Shari Dean presents from SNF with fever and cough. She has hx of dementia and is unable to provide hx. She was dehydrated on admission and found to be septic. Expect that pt oral intake was poor prior to admission but son is unable to put in context of timeframe. She is unable to feed herself. The ST evaluated her earlier today. Family member inquiring about nutrition support??  Her weight hx is stable 62-63 kg usual body weight.    Unable to complete Nutrition-Focused physical exam at this time.     Recent Labs Lab 11/09/16 0445 11/09/16 1709 11/10/16 0514  NA 157* 147* 144  K 3.8 3.4* 3.6  CL 125* 115* 113*  CO2 20* 21* 23  BUN 48* 34* 25*  CREATININE 1.32* 1.18* 1.10*  CALCIUM 7.5* 7.7* 7.6*  MG  --   --  2.2  GLUCOSE 159* 170* 113*   Abnormal labs: BUN trending down to 25, Cr trending down to 1.10   Diet Order:  DIET DYS 2 Room service appropriate? Yes; Fluid consistency: Thin  Skin:   DTI per nursing   Last BM:  11/09/16  Height:   Ht Readings from Last 1 Encounters:  11/08/16 5\' 2"  (1.575 m)    Weight:   Wt Readings from Last 1 Encounters:  11/09/16 138 lb 3.7 oz (62.7 kg)    Ideal Body Weight:  50 kg  BMI:  Body mass index is 25.28 kg/m.  Estimated Nutritional Needs:   Kcal:  1600-1700   Protein:  75-80 gr  Fluid:  1.6-1.7 liters daily  EDUCATION NEEDS: none at this time  Colman Cater Shari,RD,CSG,LDN Office: [phone] Pager:  [phone]

## 2016-11-10 NOTE — Clinical Social Work Note (Signed)
CSW spoke with Thereasa Solo at [facility] who confirms okay to return at d/c.  Benay Pike, [place]
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
## 2016-11-10 NOTE — Clinical Social Work Note (Signed)
Clinical Social Work Assessment  Patient Details  Name: Shari Dean MRN: [id number] Date of Birth: 1941-06-03  Date of referral:  11/10/16               Reason for consult:  Discharge Planning                Permission sought to share information with:  Facility Art therapist granted to share information::  Yes, Verbal Permission Granted  Name::        Agency::  [facility]  Relationship::  facility  Contact Information:     Housing/Transportation Living arrangements for the past 2 months:  [place] of Information:  Adult Children Patient Interpreter Needed:  None Criminal Activity/Legal Involvement Pertinent to Current Situation/Hospitalization:  No - Comment as needed Significant Relationships:  Adult Children Lives with:  Facility Resident Do you feel safe going back to the place where you live?  Yes Need for family participation in patient care:  Yes (Comment)  Care giving concerns:  None reported. Pt is long term resident at [facility].    Social Worker assessment / plan:  CSW met with pt's son, Shari Dean at bedside who reports he is also legal guardian. Family lives locally and check on pt daily at facility. Shari Dean reports pt has been a resident at [facility] for about 6 months. She requires a lift for transfers to wheelchair. Family take pt out on weekends if she is feeling well enough. William plans on return to [facility] and is going to complete bed hold today. CSW left voicenail for Debbie at facility requesting return call. Will confirm plan to return.   Employment status:  Retired Forensic scientist:  Medicare PT Recommendations:  Not assessed at this time [place] / Referral to community resources:  Other (Comment Required) (Return to [facility])  Patient/Family's Response to care:  Pt's son requests return to [facility] when medically stable.   Patient/Family's Understanding of and Emotional Response to Diagnosis, Current Treatment, and  Prognosis:  Pt's son appears to be very knowledgeable of pt's medical history and admission diagnosis.   Emotional Assessment Appearance:  Appears stated age Attitude/Demeanor/Rapport:  Unable to Assess Affect (typically observed):  Unable to Assess Orientation:  Oriented to Self Alcohol / Substance use:  Not Applicable Psych involvement (Current and /or in the community):  No (Comment)  Discharge Needs  Concerns to be addressed:  Discharge Planning Concerns Readmission within the last 30 days:  No Current discharge risk:  None Barriers to Discharge:  Continued Medical Work up   Salome Arnt, [place] 11/10/2016, 10:03 AM [phone]

## 2016-11-10 NOTE — Progress Notes (Signed)
Speech Language Pathology Treatment:    Patient Details Name: Shari Dean MRN: [id number] DOB: January 21, 1941 Today's Date: 11/10/2016 Time: [id number] SLP Time Calculation (min) (ACUTE ONLY): 23 min  Assessment / Plan / Recommendation Clinical Impression  Pt was lethargic throughout today's treatment, despite tactile stimulation to facial musculature and verbal prompting- unable to elicit adequate arousal for po trials. Nursing states that she has been like this all day. Provided oral care using cold, damp swab in an attempt to improve level of alertness, however not successful. Pt sons then entered room - provided education regarding current diet recommendations and safe swallowing strategies and precautions, and they demonstrated understanding by explaining recommendations. They reported that she had  no coughing/choking incidents during meal yesterday. Noted dry oral cavity, and stressed importance of oral care - explained steps for providing good oral care (left extra swabs in room). Plan to follow up with pt later in the week to ensure diet tolerance, continue with current diet recommendations, continue with aspiration precautions and oral care - spoke with nursing about this.    HPI HPI: Shari Dean a 76 y.o.femalewith medical history significant of dementia, frequent falls, dyslipidemia, osteoarthritis, nonverbal, nursing home resident presented with fever and cough worsened from last night. Patient is not verbal and bed bound therefore history is unreliable. Patient currently resides at [facility] home. As per nursing home, patient had fever this morning associated with frothy sputum. Patient had difficulty swallowing and pocketing food. She is on dysphagia diet. Patient is total care and is incontinent of bowel and bladder at baseline. History is unreliable. In the ER patient was found to be septic with fever, leukocytosis, abnormal x-ray finding, tachycardia. Cultures were  sent. X-ray with possible pneumonia. He started on Vanco and Zosyn. Admitted for further evaluation. MD ordered BSE due to possible aspiration PNA. Chest x-ray shows:Hazy left lower lobe airspace disease.      SLP Plan    Continue with current plan of care     Recommendations  Medication Administration: Crushed with puree Compensations: Slow rate                Oral Care Recommendations: Oral care before and after PO;Oral care BID;Staff/trained caregiver to provide oral care Follow up Recommendations: Skilled Nursing facility       Thank you,   Renato Gails. Megan Salon, [place], CCC-SLP  Speech-Language Pathologist [phone]                   Luther Redo 11/10/2016, 11:50 AM

## 2016-11-10 NOTE — Progress Notes (Signed)
CRITICAL VALUE ALERT  Critical value received:  Gram + Rods, Aerobic Blood Cutlure  Date of notification:  11/10/16  Time of notification:  0715  Critical value read back:Yes.    Nurse who received alert:  Sharyn Blitz, RN  MD notified (1st page):  Dr. Carolin Sicks  Time of first page:  0717  MD notified (2nd page):  Time of second page:  Responding MD:    Time MD responded:

## 2016-11-10 NOTE — NC FL2 (Signed)
[place] LEVEL OF CARE SCREENING TOOL     IDENTIFICATION  Patient Name: Shari Dean Birthdate: Jan 11, 1941 Sex: female Admission Date (Current Location): 11/08/2016  [place] and [place] Number:  [place] [id number] [place] and Address:  [facility] [address] [place]      Provider Number: [id number]  Attending Physician Name and Address:  Rosita Fire, MD  Relative Name and Phone Number:       Current Level of Care: Hospital Recommended Level of Care: [place] Prior Approval Number:    Date Approved/Denied:   PASRR Number: [id number]  Discharge Plan: SNF    Current Diagnoses: Patient Active Problem List   Diagnosis Date Noted  . Pressure injury of skin 11/09/2016  . AKI (acute kidney injury) ([place])   . Healthcare-associated pneumonia 11/08/2016  . Hypernatremia   . Sepsis ([place])   . Fall at nursing home 07/19/2012  . UTI (urinary tract infection) 07/19/2012  . Dementia 07/17/2012  . Osteoarthritis 07/17/2012    Orientation RESPIRATION BLADDER Height & Weight      (disoriented x4)  O2 (3 L) Incontinent Weight: 138 lb 3.7 oz (62.7 kg) Height:  5\' 2"  (157.5 cm)  BEHAVIORAL SYMPTOMS/MOOD NEUROLOGICAL BOWEL NUTRITION STATUS  Other (Comment) (none)  (n/a) Incontinent Diet (Dysphagia 2 with thin liquids. 100% feeding assist. PO meds crushed in puree. See d/c summary for updates.)  AMBULATORY STATUS COMMUNICATION OF NEEDS Skin   Total Care   Other (Comment) (Deep tissue injury to left ischial tuberosity with foam dressing)                       Personal Care Assistance Level of Assistance  Total care (requires lift to transfer to wheelchair)           Functional Limitations Info  Sight, Hearing Sight Info: Adequate Hearing Info: Adequate      SPECIAL CARE FACTORS FREQUENCY                       Contractures      Additional Factors Info  Code Status, Allergies  Code Status Info: Full code Allergies Info: No known allergies           Current Medications (11/10/2016):  This is the current hospital active medication list Current Facility-Administered Medications  Medication Dose Route Frequency Provider Last Rate Last Dose  . 0.9 % NaCl with KCl 20 mEq/ L  infusion   Intravenous Continuous Dron Tanna Furry, MD   Stopped at 11/10/16 1025  . acetaminophen (TYLENOL) tablet 650 mg  650 mg Oral Q6H PRN Dron Tanna Furry, MD      . enoxaparin (LOVENOX) injection 40 mg  40 mg Subcutaneous Q24H Dron Tanna Furry, MD   40 mg at 11/09/16 1414  . ipratropium-albuterol (DUONEB) 0.5-2.5 (3) MG/3ML nebulizer solution 3 mL  3 mL Nebulization BID Dron Tanna Furry, MD   3 mL at 11/10/16 0748  . metoprolol (LOPRESSOR) injection 5 mg  5 mg Intravenous Q4H PRN Dron Tanna Furry, MD      . pantoprazole (PROTONIX) injection 40 mg  40 mg Intravenous Q24H Dron Tanna Furry, MD   40 mg at 11/09/16 1414  . piperacillin-tazobactam (ZOSYN) IVPB 3.375 g  3.375 g Intravenous Q8H Lajean Saver, MD   3.375 g at 11/10/16 1024  . vancomycin (VANCOCIN) IVPB 1000 mg/200 mL premix  1,000 mg Intravenous Once Lajean Saver, MD   Stopped  at 11/08/16 1159  . vancomycin (VANCOCIN) IVPB 750 mg/150 ml premix  750 mg Intravenous Q24H Lajean Saver, MD   750 mg at 11/10/16 1025     Discharge Medications: Please see discharge summary for a list of discharge medications.  Relevant Imaging Results:  Relevant Lab Results:   Additional Information    Salome Arnt, [place]

## 2016-11-10 NOTE — Progress Notes (Addendum)
PROGRESS NOTE    Shari Dean  [id number] DOB: 25-Jun-1941 DOA: 11/08/2016 PCP: Shari Brunner, MD   Brief Narrative: 76 y.o. female with medical history significant of dementia, frequent falls, dyslipidemia, osteoarthritis, nonverbal, nursing home resident presented with fever and cough. Patient is not verbal and bed bound. In the ER patient was found to be septic with fever, leukocytosis, abnormal x-ray finding, tachycardia. Cultures were sent. X-ray with possible pneumonia.   Assessment & Plan:   # Sepsis due to healthcare associated pneumonia (gram positive rod bacteremia): Patient with fever, leukocytosis, tachycardia, elevated lactate, chest x-ray with left lower lobe airspace disease consistent with pneumonia. -1 out 2 blood culture growing gram-positive rods. Nasal MRSA screening negative. I will discontinue vancomycin and continue IV Zosyn for now. She is clinically improving. I will repeat blood culture today to monitor clearance. Follow up final culture results. -Patient has risk of aspiration pneumonia. Evaluated by swallow team currently on dysphagia level II diet. Continue IV fluid, Protonix and supportive care.  #hypernatremia due to dehydration: On admission patient had serum sodium level of 167 with severe dehydration. No sodium level improved to 144. Changed to IV fluid with normal saline and potassium with lowering rate. Encourage oral intake.   #Acute kidney injury in the setting of dehydration. Serum creatinine level improved with IV fluid.  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 Patient usually nonverbal and bedbound. Continue supportive care. -Social worker evaluation -Fall precaution. -Discharge to SNF at the end of his hospitalization.  #Goals of care discussion: Because of advanced dementia bedbound, poor functional status I discussed the goals of care with  the patient's son and daughter at bedside during admission. They want full code this time. -Palliative care consult requested. Discussed with the palliative care team today. Active Problems:   Healthcare-associated pneumonia   Pressure injury of skin   AKI (acute kidney injury) ([place])  DVT prophylaxis: Lovenox subcutaneous Code Status: Full code Family Communication: No family member present at bedside Disposition Plan: Transfer out of the step down to telemetry floor.    Consultants:   None  Procedures: None Antimicrobials: DC vancomycin today. Continue Zosyn.  Subjective: Patient was seen and examined at bedside. Patient is more alert and awake but nonverbal. Suspect this is her baseline. Review of systems Limited. Patient's brother at bedside.  Objective: Vitals:   11/09/16 2135 11/10/16 0502 11/10/16 0749 11/10/16 1246  BP:  (!) 110/53  121/64  Pulse:  (!) 101  (!) 106  Resp:  18  18  Temp:  98.5 F (36.9 C)  98.8 F (37.1 C)  TempSrc:  Oral  Axillary  SpO2: 96% 100% 97% 95%  Weight:      Height:        Intake/Output Summary (Last 24 hours) at 11/10/16 1542 Last data filed at 11/10/16 1025  Gross per 24 hour  Intake          2337.08 ml  Output                0 ml  Net          2337.08 ml   Filed Weights   11/08/16 1204 11/08/16 1456 11/09/16 0459  Weight: 63.5 kg (140 lb) 59.6 kg (131 lb 6.3 oz) 62.7 kg (138 lb 3.7 oz)    Examination:  General exam: Elderly female looks more alert, nonverbal. Not in distress.  Respiratory system: Clear bilateral, respiratory effort  normal, no wheezing or crackle.  Cardiovascular system: S1 & S2 heard, RRR.  No pedal edema. Gastrointestinal system: Abdomen is soft, nontender, nondistended. Bowel sound positive. Central nervous system: Alert awake but not following commands. No focal deficits. Extremities: Not following commands Skin: No rashes, lesions or ulcers Psychiatry: Unable to assess, patient is not following  commands. This is her baseline.    Data Reviewed: I have personally reviewed following labs and imaging studies  CBC:  Recent Labs Lab 11/08/16 1023 11/09/16 0445  WBC 15.0* 12.4*  NEUTROABS 11.8*  --   HGB 13.8 10.6*  HCT 45.7 35.9*  MCV 97.0 98.4  PLT 150 123456*   Basic Metabolic Panel:  Recent Labs Lab 11/08/16 1347 11/08/16 1654 11/09/16 0445 11/09/16 1709 11/10/16 0514  NA 165* 167* 157* 147* 144  K 3.3* 3.5 3.8 3.4* 3.6  CL 128* 129* 125* 115* 113*  CO2 27 25 20* 21* 23  GLUCOSE 161* 140* 159* 170* 113*  BUN 62* 60* 48* 34* 25*  CREATININE 1.78* 1.58* 1.32* 1.18* 1.10*  CALCIUM 7.7* 7.9* 7.5* 7.7* 7.6*  MG  --   --   --   --  2.2   GFR: Estimated Creatinine Clearance: 38.4 mL/min (by C-G formula based on SCr of 1.1 mg/dL (H)). Liver Function Tests:  Recent Labs Lab 11/08/16 1023  AST 29  ALT 42  ALKPHOS 107  BILITOT 0.9  PROT 8.1  ALBUMIN 3.4*   No results for input(s): LIPASE, AMYLASE in the last 168 hours. No results for input(s): AMMONIA in the last 168 hours. Coagulation Profile:  Recent Labs Lab 11/08/16 1023  INR 1.31   Cardiac Enzymes: No results for input(s): CKTOTAL, CKMB, CKMBINDEX, TROPONINI in the last 168 hours. BNP (last 3 results) No results for input(s): PROBNP in the last 8760 hours. HbA1C: No results for input(s): HGBA1C in the last 72 hours. CBG: No results for input(s): GLUCAP in the last 168 hours. Lipid Profile: No results for input(s): CHOL, HDL, LDLCALC, TRIG, CHOLHDL, LDLDIRECT in the last 72 hours. Thyroid Function Tests: No results for input(s): TSH, T4TOTAL, FREET4, T3FREE, THYROIDAB in the last 72 hours. Anemia Panel: No results for input(s): VITAMINB12, FOLATE, FERRITIN, TIBC, IRON, RETICCTPCT in the last 72 hours. Sepsis Labs:  Recent Labs Lab 11/08/16 1035 11/08/16 1436 11/09/16 0445  LATICACIDVEN 3.31* 3.00* 2.8*    Recent Results (from the past 240 hour(s))  Culture, blood (Routine x 2)      Status: None (Preliminary result)   Collection Time: 11/08/16 10:24 AM  Result Value Ref Range Status   Specimen Description BLOOD RIGHT ARM DRAWN BY IV THERAPY DRAWN BY RN  Final   Special Requests BOTTLES DRAWN AEROBIC AND ANAEROBIC 6CC  Final   Culture NO GROWTH 2 DAYS  Final   Report Status PENDING  Incomplete  Culture, blood (Routine x 2)     Status: None (Preliminary result)   Collection Time: 11/08/16 10:54 AM  Result Value Ref Range Status   Specimen Description BLOOD BLOOD LEFT WRIST  Final   Special Requests BOTTLES DRAWN AEROBIC AND ANAEROBIC 6CC  Final   Culture  Setup Time   Final    GRAM POSITIVE RODS AEROBIC BOTTLE Gram Stain Report Called to,Read Back By and Verified With: MAYS,J AT [id number] BY HUFFINES,S ON 11/10/16. Organism ID to follow Performed at [facility], [place] [address] [place], [place] [address]    Culture NO GROWTH 2 DAYS  Final   Report Status PENDING  Incomplete  Blood Culture ID Panel (Reflexed)     Status: None   Collection Time: 11/08/16 10:54 AM  Result Value Ref Range Status   Enterococcus species NOT DETECTED NOT DETECTED Final   Listeria monocytogenes NOT DETECTED NOT DETECTED Final   Staphylococcus species NOT DETECTED NOT DETECTED Final   Staphylococcus aureus NOT DETECTED NOT DETECTED Final   Streptococcus species NOT DETECTED NOT DETECTED Final   Streptococcus agalactiae NOT DETECTED NOT DETECTED Final   Streptococcus pneumoniae NOT DETECTED NOT DETECTED Final   Streptococcus pyogenes NOT DETECTED NOT DETECTED Final   Acinetobacter baumannii NOT DETECTED NOT DETECTED Final   Enterobacteriaceae species NOT DETECTED NOT DETECTED Final   Enterobacter cloacae complex NOT DETECTED NOT DETECTED Final   Escherichia coli NOT DETECTED NOT DETECTED Final   Klebsiella oxytoca NOT DETECTED NOT DETECTED Final   Klebsiella pneumoniae NOT DETECTED NOT DETECTED Final   Proteus species NOT DETECTED NOT DETECTED Final   Serratia marcescens NOT DETECTED  NOT DETECTED Final   Haemophilus influenzae NOT DETECTED NOT DETECTED Final   Neisseria meningitidis NOT DETECTED NOT DETECTED Final   Pseudomonas aeruginosa NOT DETECTED NOT DETECTED Final   Candida albicans NOT DETECTED NOT DETECTED Final   Candida glabrata NOT DETECTED NOT DETECTED Final   Candida krusei NOT DETECTED NOT DETECTED Final   Candida parapsilosis NOT DETECTED NOT DETECTED Final   Candida tropicalis NOT DETECTED NOT DETECTED Final    Comment: Performed at [facility], [address], [place], [place] [address]  Respiratory Panel by PCR     Status: None   Collection Time: 11/08/16  1:39 PM  Result Value Ref Range Status   Adenovirus NOT DETECTED NOT DETECTED Final   Coronavirus 229E NOT DETECTED NOT DETECTED Final   Coronavirus HKU1 NOT DETECTED NOT DETECTED Final   Coronavirus NL63 NOT DETECTED NOT DETECTED Final   Coronavirus OC43 NOT DETECTED NOT DETECTED Final   Metapneumovirus NOT DETECTED NOT DETECTED Final   Rhinovirus / Enterovirus NOT DETECTED NOT DETECTED Final   Influenza A NOT DETECTED NOT DETECTED Final   Influenza B NOT DETECTED NOT DETECTED Final   Parainfluenza Virus 1 NOT DETECTED NOT DETECTED Final   Parainfluenza Virus 2 NOT DETECTED NOT DETECTED Final   Parainfluenza Virus 3 NOT DETECTED NOT DETECTED Final   Parainfluenza Virus 4 NOT DETECTED NOT DETECTED Final   Respiratory Syncytial Virus NOT DETECTED NOT DETECTED Final   Bordetella pertussis NOT DETECTED NOT DETECTED Final   Chlamydophila pneumoniae NOT DETECTED NOT DETECTED Final   Mycoplasma pneumoniae NOT DETECTED NOT DETECTED Final    Comment: Performed at [facility], [place] [address], [place], [place] [address]  MRSA PCR Screening     Status: None   Collection Time: 11/08/16  3:35 PM  Result Value Ref Range Status   MRSA by PCR NEGATIVE NEGATIVE Final    Comment:        The GeneXpert MRSA Assay (FDA approved for NASAL specimens only), is one component of a comprehensive MRSA  colonization surveillance program. It is not intended to diagnose MRSA infection nor to guide or monitor treatment for MRSA infections.          Radiology Studies: No results found.      Scheduled Meds: . enoxaparin (LOVENOX) injection  40 mg Subcutaneous Q24H  . ipratropium-albuterol  3 mL Nebulization BID  . pantoprazole (PROTONIX) IV  40 mg Intravenous Q24H  . piperacillin-tazobactam (ZOSYN)  IV  3.375 g Intravenous Q8H  Continuous Infusions: . 0.9 % NaCl with KCl 20 mEq / L 75 mL/hr at 11/10/16 1125     LOS: 2 days    Brodey Bonn Tanna Furry, MD [facility] Pager [phone]  If 7PM-7AM, please contact night-coverage [email or web address] Password [facility] 11/10/2016, 3:42 PM

## 2016-11-11 LAB — BASIC METABOLIC PANEL
Anion gap: 9 (ref 5–15)
BUN: 14 mg/dL (ref 6–20)
CALCIUM: 7.6 mg/dL — AB (ref 8.9–10.3)
CHLORIDE: 113 mmol/L — AB (ref 101–111)
CO2: 21 mmol/L — ABNORMAL LOW (ref 22–32)
CREATININE: 0.9 mg/dL (ref 0.44–1.00)
GFR calc non Af Amer: >60 mL/min (ref >60)
Glucose, Bld: 105 mg/dL — ABNORMAL HIGH (ref 65–99)
Potassium: 3.4 mmol/L — ABNORMAL LOW (ref 3.5–5.1)
SODIUM: 143 mmol/L (ref 135–145)

## 2016-11-11 MED ORDER — VANCOMYCIN HCL IN DEXTROSE 1-5 GM/200ML-% IV SOLN
1000.0000 mg | Freq: Once | INTRAVENOUS | Status: AC
  Administered 2016-11-11: 1000 mg via INTRAVENOUS
  Filled 2016-11-11: qty 200

## 2016-11-11 MED ORDER — POTASSIUM CHLORIDE 20 MEQ/15ML (10%) PO SOLN
40.0000 meq | Freq: Every day | ORAL | Status: DC
  Administered 2016-11-11 – 2016-11-12 (×2): 40 meq via ORAL
  Filled 2016-11-11 (×2): qty 30

## 2016-11-11 MED ORDER — VANCOMYCIN HCL 500 MG IV SOLR
500.0000 mg | Freq: Two times a day (BID) | INTRAVENOUS | Status: DC
  Administered 2016-11-11 – 2016-11-12 (×2): 500 mg via INTRAVENOUS
  Filled 2016-11-11 (×4): qty 500

## 2016-11-11 NOTE — Progress Notes (Signed)
Pharmacy Antibiotic Note  Shari Dean is a 76 y.o. female admitted on 11/08/2016 with sepsis.  Pharmacy has been consulted for  Zosyn dosing.  Plan: Continue Zosyn 3.375 GM IV every 8 hours Labs per protocol  Height: 5\' 2"  (157.5 cm) Weight: 138 lb 3.7 oz (62.7 kg) IBW/kg (Calculated) : 50.1  Temp (24hrs), Avg:99.9 F (37.7 C), Min:98.8 F (37.1 C), Max:100.9 F (38.3 C)   Recent Labs Lab 11/08/16 1023 11/08/16 1035  11/08/16 1436 11/08/16 1654 11/09/16 0445 11/09/16 1709 11/10/16 0514 11/11/16 0619  WBC 15.0*  --   --   --   --  12.4*  --   --   --   CREATININE 1.88*  --   < >  --  1.58* 1.32* 1.18* 1.10* 0.90  LATICACIDVEN  --  3.31*  --  3.00*  --  2.8*  --   --   --   < > = values in this interval not displayed.  Estimated Creatinine Clearance: 47 mL/min (by C-G formula based on SCr of 0.9 mg/dL).    No Known Allergies  Antimicrobials this admission: Vancomycin 1/20 >> 1/21 Zosyn 1/20 >>   Thank you for allowing pharmacy to be a part of this patient's care.  Hart Robinsons A 11/11/2016 10:19 AM

## 2016-11-11 NOTE — Progress Notes (Addendum)
PROGRESS NOTE    Shari Dean  [id number] DOB: 1940-11-11 DOA: 11/08/2016 PCP: Madelyn Brunner, MD   Brief Narrative: 76 y.o. female with medical history significant of dementia, frequent falls, dyslipidemia, osteoarthritis, nonverbal, nursing home resident presented with fever and cough. Patient is not verbal and bed bound. In the ER patient was found to be septic with fever, leukocytosis, abnormal x-ray finding, tachycardia. Cultures were sent. X-ray with possible pneumonia.   Assessment & Plan:   # Sepsis due to healthcare associated pneumonia (gram positive rod bacteremia): Patient with fever, leukocytosis, tachycardia, elevated lactate, chest x-ray with left lower lobe airspace disease consistent with pneumonia. -1 out 2 blood culture growing gram-positive rods ( corynebacterium speciies). Pt is on zosyn now. She has low grade fever. I will restart IV vancomycin until I have sensitivity result. Blood cultures were repeated yesterday. Follow up the result and clinical response. She is gradually improving.  -Patient has risk of aspiration pneumonia. Evaluated by swallow team currently on dysphagia level II diet. Continue IV fluid, Protonix and supportive care.  #hypernatremia due to dehydration: On admission patient had serum sodium level of 167 with severe dehydration. Now sodium level improved to 143. Continue IVF. Encourage oral intake.   #Acute kidney injury in the setting of dehydration. Serum creatinine level improved with IV fluid.  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 Patient usually nonverbal and bedbound. Continue supportive care. -Social worker evaluation -Fall precaution. -Discharge to SNF at the end of his hospitalization.  #Goals of care discussion: Because of advanced dementia bedbound, poor functional status I discussed the goals of care with the patient's son  and daughter at bedside during admission. They want full code this time. -Palliative care consult requested. Discussed with the palliative care team today. Active Problems:   Healthcare-associated pneumonia   Pressure injury of skin   AKI (acute kidney injury) ([place])   Palliative care encounter   Goals of care, counseling/discussion   DNR (do not resuscitate) discussion  DVT prophylaxis: Lovenox subcutaneous Code Status: Full code Family Communication: No family member present at bedside Disposition Plan: Likely discharge to SNF in 1-2 days..    Consultants:   None  Procedures: None Antimicrobials: vanco and zosyn.  Subjective: Patient was seen and examined at bedside. Patient with low-grade temperature. Patient is nonverbal but she is alert. Unable to obtain review of system.  Objective: Vitals:   11/10/16 2119 11/10/16 2252 11/11/16 0601 11/11/16 0915  BP:  129/64 121/66   Pulse:  (!) 120 (!) 107   Resp:  18 18   Temp:  100 F (37.8 C) (!) 100.9 F (38.3 C)   TempSrc:  Axillary Axillary   SpO2: 94% 95% 96% 93%  Weight:      Height:        Intake/Output Summary (Last 24 hours) at 11/11/16 1242 Last data filed at 11/11/16 1149  Gross per 24 hour  Intake           625.42 ml  Output                0 ml  Net           625.42 ml   Filed Weights   11/08/16 1204 11/08/16 1456 11/09/16 0459  Weight: 63.5 kg (140 lb) 59.6 kg (131 lb 6.3 oz) 62.7 kg (138 lb 3.7 oz)    Examination:  General exam: Elderly female looks more alert, nonverbal.  Not in distress  Respiratory system: Clear bilateral, respiratory effort normal, no wheezing or crackle..  Cardiovascular system: S1 & S2 heard, RRR.  No pedal edema. Gastrointestinal system: Abdomen is soft, nontender, nondistended. Bowel sound positive. Central nervous system: Alert awake but not following commands. No focal deficits. Extremities: Not following commands Skin: No rashes, lesions or ulcers Psychiatry: Unable to  assess, patient is not following commands. This is her baseline.    Data Reviewed: I have personally reviewed following labs and imaging studies  CBC:  Recent Labs Lab 11/08/16 1023 11/09/16 0445  WBC 15.0* 12.4*  NEUTROABS 11.8*  --   HGB 13.8 10.6*  HCT 45.7 35.9*  MCV 97.0 98.4  PLT 150 123456*   Basic Metabolic Panel:  Recent Labs Lab 11/08/16 1654 11/09/16 0445 11/09/16 1709 11/10/16 0514 11/11/16 0619  NA 167* 157* 147* 144 143  K 3.5 3.8 3.4* 3.6 3.4*  CL 129* 125* 115* 113* 113*  CO2 25 20* 21* 23 21*  GLUCOSE 140* 159* 170* 113* 105*  BUN 60* 48* 34* 25* 14  CREATININE 1.58* 1.32* 1.18* 1.10* 0.90  CALCIUM 7.9* 7.5* 7.7* 7.6* 7.6*  MG  --   --   --  2.2  --    GFR: Estimated Creatinine Clearance: 47 mL/min (by C-G formula based on SCr of 0.9 mg/dL). Liver Function Tests:  Recent Labs Lab 11/08/16 1023  AST 29  ALT 42  ALKPHOS 107  BILITOT 0.9  PROT 8.1  ALBUMIN 3.4*   No results for input(s): LIPASE, AMYLASE in the last 168 hours. No results for input(s): AMMONIA in the last 168 hours. Coagulation Profile:  Recent Labs Lab 11/08/16 1023  INR 1.31   Cardiac Enzymes: No results for input(s): CKTOTAL, CKMB, CKMBINDEX, TROPONINI in the last 168 hours. BNP (last 3 results) No results for input(s): PROBNP in the last 8760 hours. HbA1C: No results for input(s): HGBA1C in the last 72 hours. CBG: No results for input(s): GLUCAP in the last 168 hours. Lipid Profile: No results for input(s): CHOL, HDL, LDLCALC, TRIG, CHOLHDL, LDLDIRECT in the last 72 hours. Thyroid Function Tests: No results for input(s): TSH, T4TOTAL, FREET4, T3FREE, THYROIDAB in the last 72 hours. Anemia Panel: No results for input(s): VITAMINB12, FOLATE, FERRITIN, TIBC, IRON, RETICCTPCT in the last 72 hours. Sepsis Labs:  Recent Labs Lab 11/08/16 1035 11/08/16 1436 11/09/16 0445  LATICACIDVEN 3.31* 3.00* 2.8*    Recent Results (from the past 240 hour(s))  Culture,  blood (Routine x 2)     Status: None (Preliminary result)   Collection Time: 11/08/16 10:24 AM  Result Value Ref Range Status   Specimen Description BLOOD RIGHT ARM DRAWN BY IV THERAPY DRAWN BY RN  Final   Special Requests BOTTLES DRAWN AEROBIC AND ANAEROBIC 6CC  Final   Culture NO GROWTH 3 DAYS  Final   Report Status PENDING  Incomplete  Culture, blood (Routine x 2)     Status: Abnormal (Preliminary result)   Collection Time: 11/08/16 10:54 AM  Result Value Ref Range Status   Specimen Description BLOOD BLOOD LEFT WRIST  Final   Special Requests BOTTLES DRAWN AEROBIC AND ANAEROBIC 6CC  Final   Culture  Setup Time   Final    GRAM POSITIVE RODS AEROBIC BOTTLE Gram Stain Report Called to,Read Back By and Verified With: MAYS,J AT [id number] BY HUFFINES,S ON 11/10/16.    Culture (A)  Final    DIPHTHEROIDS(CORYNEBACTERIUM SPECIES) Standardized susceptibility testing for this organism is not available. Performed at  [facility], [place] [address], [place], [place] [address]    Report Status PENDING  Incomplete  Blood Culture ID Panel (Reflexed)     Status: None   Collection Time: 11/08/16 10:54 AM  Result Value Ref Range Status   Enterococcus species NOT DETECTED NOT DETECTED Final   Listeria monocytogenes NOT DETECTED NOT DETECTED Final   Staphylococcus species NOT DETECTED NOT DETECTED Final   Staphylococcus aureus NOT DETECTED NOT DETECTED Final   Streptococcus species NOT DETECTED NOT DETECTED Final   Streptococcus agalactiae NOT DETECTED NOT DETECTED Final   Streptococcus pneumoniae NOT DETECTED NOT DETECTED Final   Streptococcus pyogenes NOT DETECTED NOT DETECTED Final   Acinetobacter baumannii NOT DETECTED NOT DETECTED Final   Enterobacteriaceae species NOT DETECTED NOT DETECTED Final   Enterobacter cloacae complex NOT DETECTED NOT DETECTED Final   Escherichia coli NOT DETECTED NOT DETECTED Final   Klebsiella oxytoca NOT DETECTED NOT DETECTED Final   Klebsiella pneumoniae NOT  DETECTED NOT DETECTED Final   Proteus species NOT DETECTED NOT DETECTED Final   Serratia marcescens NOT DETECTED NOT DETECTED Final   Haemophilus influenzae NOT DETECTED NOT DETECTED Final   Neisseria meningitidis NOT DETECTED NOT DETECTED Final   Pseudomonas aeruginosa NOT DETECTED NOT DETECTED Final   Candida albicans NOT DETECTED NOT DETECTED Final   Candida glabrata NOT DETECTED NOT DETECTED Final   Candida krusei NOT DETECTED NOT DETECTED Final   Candida parapsilosis NOT DETECTED NOT DETECTED Final   Candida tropicalis NOT DETECTED NOT DETECTED Final    Comment: Performed at [facility], [place] [address] [place], [place] [address]  Respiratory Panel by PCR     Status: None   Collection Time: 11/08/16  1:39 PM  Result Value Ref Range Status   Adenovirus NOT DETECTED NOT DETECTED Final   Coronavirus 229E NOT DETECTED NOT DETECTED Final   Coronavirus HKU1 NOT DETECTED NOT DETECTED Final   Coronavirus NL63 NOT DETECTED NOT DETECTED Final   Coronavirus OC43 NOT DETECTED NOT DETECTED Final   Metapneumovirus NOT DETECTED NOT DETECTED Final   Rhinovirus / Enterovirus NOT DETECTED NOT DETECTED Final   Influenza A NOT DETECTED NOT DETECTED Final   Influenza B NOT DETECTED NOT DETECTED Final   Parainfluenza Virus 1 NOT DETECTED NOT DETECTED Final   Parainfluenza Virus 2 NOT DETECTED NOT DETECTED Final   Parainfluenza Virus 3 NOT DETECTED NOT DETECTED Final   Parainfluenza Virus 4 NOT DETECTED NOT DETECTED Final   Respiratory Syncytial Virus NOT DETECTED NOT DETECTED Final   Bordetella pertussis NOT DETECTED NOT DETECTED Final   Chlamydophila pneumoniae NOT DETECTED NOT DETECTED Final   Mycoplasma pneumoniae NOT DETECTED NOT DETECTED Final    Comment: Performed at [facility], [place] [address], [place], [place] [address]  MRSA PCR Screening     Status: None   Collection Time: 11/08/16  3:35 PM  Result Value Ref Range Status   MRSA by PCR NEGATIVE NEGATIVE Final    Comment:         The GeneXpert MRSA Assay (FDA approved for NASAL specimens only), is one component of a comprehensive MRSA colonization surveillance program. It is not intended to diagnose MRSA infection nor to guide or monitor treatment for MRSA infections.   Culture, blood (routine x 2)     Status: None (Preliminary result)   Collection Time: 11/10/16  4:29 PM  Result Value Ref Range Status   Specimen Description BLOOD RIGHT HAND  Final   Special  Requests BOTTLES DRAWN AEROBIC AND ANAEROBIC 6CC  Final   Culture NO GROWTH < 24 HOURS  Final   Report Status PENDING  Incomplete  Culture, blood (routine x 2)     Status: None (Preliminary result)   Collection Time: 11/10/16  4:31 PM  Result Value Ref Range Status   Specimen Description BLOOD LEFT HAND  Final   Special Requests BOTTLES DRAWN AEROBIC AND ANAEROBIC 6CC  Final   Culture NO GROWTH < 24 HOURS  Final   Report Status PENDING  Incomplete         Radiology Studies: No results found.      Scheduled Meds: . enoxaparin (LOVENOX) injection  40 mg Subcutaneous Q24H  . feeding supplement (ENSURE ENLIVE)  237 mL Oral BID BM  . ipratropium-albuterol  3 mL Nebulization BID  . pantoprazole (PROTONIX) IV  40 mg Intravenous Q24H  . piperacillin-tazobactam (ZOSYN)  IV  3.375 g Intravenous Q8H  . potassium chloride  40 mEq Oral Daily  . vancomycin  1,000 mg Intravenous Q12H   Continuous Infusions: . 0.9 % NaCl with KCl 20 mEq / L 50 mL/hr at 11/10/16 1540     LOS: 3 days    Nyaisha Simao Tanna Furry, MD [facility] Pager [phone]  If 7PM-7AM, please contact night-coverage [email or web address] Password [email or web address] 11/11/2016, 12:42 PM

## 2016-11-12 LAB — CBC
HCT: 27 % — ABNORMAL LOW (ref 36.0–46.0)
HEMOGLOBIN: 8.8 g/dL — AB (ref 12.0–15.0)
MCH: 29.4 pg (ref 26.0–34.0)
MCHC: 32.6 g/dL (ref 30.0–36.0)
MCV: 90.3 fL (ref 78.0–100.0)
Platelets: 176 10*3/uL (ref 150–400)
RBC: 2.99 MIL/uL — AB (ref 3.87–5.11)
RDW: 14.4 % (ref 11.5–15.5)
WBC: 9.1 10*3/uL (ref 4.0–10.5)

## 2016-11-12 LAB — CULTURE, BLOOD (ROUTINE X 2)

## 2016-11-12 LAB — BASIC METABOLIC PANEL
ANION GAP: 8 (ref 5–15)
BUN: 10 mg/dL (ref 6–20)
CALCIUM: 7.6 mg/dL — AB (ref 8.9–10.3)
CHLORIDE: 114 mmol/L — AB (ref 101–111)
CO2: 21 mmol/L — AB (ref 22–32)
CREATININE: 0.88 mg/dL (ref 0.44–1.00)
GFR calc non Af Amer: >60 mL/min (ref >60)
GLUCOSE: 114 mg/dL — AB (ref 65–99)
Potassium: 3.5 mmol/L (ref 3.5–5.1)
Sodium: 143 mmol/L (ref 135–145)

## 2016-11-12 MED ORDER — LEVOFLOXACIN 500 MG PO TABS: 500.0000 mg | ORAL_TABLET | Freq: Every day | ORAL | Status: DC
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
## 2016-11-12 NOTE — Progress Notes (Signed)
Speech Language Pathology Treatment:    Patient Details Name: Shari Dean MRN: [id number] DOB: January 11, 1941 Today's Date: 11/12/2016 Time: [id number] SLP Time Calculation (min) (ACUTE ONLY): 31 min  Assessment / Plan / Recommendation Clinical Impression  Pt had improved level of alertness during ST treatment today (opening eyes, smiling), son was present at times. She was seen in bed, repositioning was required for safe intake. Oral care provided before trials. She tolerated trials with ice chips, water, applesauce, and cracker with applesauce with no s/sx of aspiration across trials, required min verbal and tactile cues to accept solids. She consumed water with no s/sx of aspiration, timely swallow. Son states he offers ensure throughout the day with some success. Spoke with CNA who reported  significant pocketing with eggs this morning (which required suctioning to oral cavity and oral care) with breakfast, recommended mixing eggs and grits together (as family states she tolerates this well). Son stated that she had one coughing episode with meats during dinner last night, and she expelled the remainder from oral cavity -he  then withheld additional meats. SLP changed diet to D2 with puree meats - collaborated with nsg about change. Emphasized importance of continuing oral care with CNA and son. Provided education to son about safe swallowing strategies, referring to guidelines above pt HOB. Continue to f/u with pt during stay on acute to ensure diet tolerance.    HPI HPI: Shari Dean a 76 y.o.femalewith medical history significant of dementia, frequent falls, dyslipidemia, osteoarthritis, nonverbal, nursing home resident presented with fever and cough worsened from last night. Patient is not verbal and bed bound therefore history is unreliable. Patient currently resides at [facility] home. As per nursing home, patient had fever this morning associated with frothy sputum. Patient  had difficulty swallowing and pocketing food. She is on dysphagia diet. Patient is total care and is incontinent of bowel and bladder at baseline. History is unreliable. In the ER patient was found to be septic with fever, leukocytosis, abnormal x-ray finding, tachycardia. Cultures were sent. X-ray with possible pneumonia. He started on Vanco and Zosyn. Admitted for further evaluation. MD ordered BSE due to possible aspiration PNA. Chest x-ray shows:Hazy left lower lobe airspace disease.      SLP Plan  Continue with current plan of care     Recommendations  Diet recommendations: Dysphagia 2 (fine chop);Other(comment);Thin liquid (puree meats ) Liquids provided via: Cup;Straw;Teaspoon Medication Administration: Crushed with puree Compensations: Slow rate                Oral Care Recommendations: Oral care before and after PO;Oral care BID;Staff/trained caregiver to provide oral care Follow up Recommendations: Skilled Nursing facility Plan: Continue with current plan of care       Thank you,   Renato Gails. Megan Salon, [place], CCC-SLP  Speech-Language Pathologist [phone]                    Luther Redo 11/12/2016, 10:47 AM

## 2016-11-12 NOTE — Progress Notes (Signed)
Report called to [facility], LPN at facility. IV cath removed and intact. No report of pain at this time. Patient will return via EMS to [facility].

## 2016-11-12 NOTE — Discharge Summary (Signed)
Shari Dean [id number] DOB: [birth year] DOA: 11/08/2016  PCP: Madelyn Brunner, MD  Admit date: 11/08/2016  Discharge date: 11/12/2016  Admitted From: SNF   Disposition:  SNF   Recommendations for Outpatient Follow-up:   Follow up with PCP in 1-2 weeks  PCP Please obtain BMP/CBC, 2 view CXR in 1week,  (see Discharge instructions)   PCP Please follow up on the following pending results: None   Home Health: None   Equipment/Devices: None  Consultations: None Discharge Condition: Stable  CODE STATUS: Full   Diet Recommendation: DIET DYS 2  with feeding assistance and aspiration precautions   Chief Complaint  Patient presents with  . Fever     Brief history of present illness from the day of admission and additional interim summary    76 y.o.femalewith medical history significant of dementia, frequent falls, dyslipidemia, osteoarthritis, nonverbal, nursing home resident presented with fever and cough. Patient is not verbal and bed bound. In the ER patient was found to be septic with fever, leukocytosis, due to pneumonia.   Hospital issues addressed      1.Sepsis due to pneumonia. Bacteremia likely was contamination with steroids one out of 2, patient was treated with IV antibiotics to good effect, she is close to her baseline, sepsis physiology has resolved, she is not short of breath, she will be placed on 4 more days of oral Levaquin and discharged back to SNF.   2. Dysphagia in the setting of advanced baseline Alzheimer's dementia. On dysphagia 2 diet with full feeding assistance and aspiration precautions, continue speech therapy follow-up at SNF. Supportive care for dementia. She remains at risk for delirium, minimize benzodiazepines and narcotics.  3. Dehydration with ARF and hypernatremia.  Resolved with IV fluids.   Discharge diagnosis     Active Problems:   Healthcare-associated pneumonia   Pressure injury of skin   AKI (acute kidney injury) ([place])   Palliative care encounter   Goals of care, counseling/discussion   DNR (do not resuscitate) discussion    Discharge instructions    Discharge Instructions    Discharge instructions    Complete by:  As directed    Follow with Primary MD Madelyn Brunner, MD in 7 days   Get CBC, CMP, 2 view Chest X ray checked  by Primary MD or SNF MD in 5-7 days ( we routinely change or add medications that can affect your baseline labs and fluid status, therefore we recommend that you get the mentioned basic workup next visit with your PCP, your PCP may decide not to get them or add new tests based on their clinical decision)   Activity: As tolerated with Full fall precautions use walker/cane & assistance as needed   Disposition SNF     Diet:   DIET DYS 2 with feeding assistance and aspiration precautions.  For Heart failure patients - Check your Weight same time everyday, if you gain over 2 pounds, or you develop in leg swelling, experience more shortness of breath or chest pain,  call your Primary MD immediately. Follow Cardiac Low Salt Diet and 1.5 lit/day fluid restriction.   On your next visit with your primary care physician please Get Medicines reviewed and adjusted.   Please request your Prim.MD to go over all Hospital Tests and Procedure/Radiological results at the follow up, please get all Hospital records sent to your Prim MD by signing hospital release before you go home.   If you experience worsening of your admission symptoms, develop shortness of breath, life threatening emergency, suicidal or homicidal thoughts you must seek medical attention immediately by calling 911 or calling your MD immediately  if symptoms less severe.  You Must read complete instructions/literature along with all the possible adverse  reactions/side effects for all the Medicines you take and that have been prescribed to you. Take any new Medicines after you have completely understood and accpet all the possible adverse reactions/side effects.   Do not drive, operate heavy machinery, perform activities at heights, swimming or participation in water activities or provide baby sitting services if your were admitted for syncope or siezures until you have seen by Primary MD or a Neurologist and advised to do so again.  Do not drive when taking Pain medications.    Do not take more than prescribed Pain, Sleep and Anxiety Medications  Special Instructions: If you have smoked or chewed Tobacco  in the last 2 yrs please stop smoking, stop any regular Alcohol  and or any Recreational drug use.  Wear Seat belts while driving.   Please note  You were cared for by a hospitalist during your hospital stay. If you have any questions about your discharge medications or the care you received while you were in the hospital after you are discharged, you can call the unit and asked to speak with the hospitalist on call if the hospitalist that took care of you is not available. Once you are discharged, your primary care physician will handle any further medical issues. Please note that NO REFILLS for any discharge medications will be authorized once you are discharged, as it is imperative that you return to your primary care physician (or establish a relationship with a primary care physician if you do not have one) for your aftercare needs so that they can reassess your need for medications and monitor your lab values.   Increase activity slowly    Complete by:  As directed       Discharge Medications   Allergies as of 11/12/2016   No Known Allergies     Medication List    TAKE these medications   acetaminophen 325 MG tablet Commonly known as:  TYLENOL Take 650 mg by mouth every 6 (six) hours as needed. For pain   levofloxacin 500 MG  tablet Commonly known as:  LEVAQUIN Take 1 tablet (500 mg total) by mouth daily. 4 more days   senna 8.6 MG Tabs tablet Commonly known as:  SENOKOT Take 1 tablet by mouth at bedtime.   sertraline 25 MG tablet Commonly known as:  ZOLOFT Take 25 mg by mouth daily.       Follow-up Information    Sarina Ser, Hewitt Blade, MD. Schedule an appointment as soon as possible for a visit in 1 week(s).   Specialty:  Internal Medicine Contact information: [address] [place] [address] [phone]           Major procedures and Radiology Reports - PLEASE review detailed and final reports thoroughly  -  Dg Chest Portable 1 View  Result Date: 11/08/2016 CLINICAL DATA:  Short of breath EXAM: PORTABLE CHEST 1 VIEW COMPARISON:  07/17/2012 FINDINGS: There is hazy airspace disease at the left base. Lungs otherwise clear. Normal heart size. No pneumothorax. IMPRESSION: Hazy left lower lobe airspace disease. Followup PA and lateral chest X-ray is recommended in 3-4 weeks following trial of antibiotic therapy to ensure resolution and exclude underlying malignancy. Electronically Signed   By: Marybelle Killings M.D.   On: 11/08/2016 10:42    Micro Results     Recent Results (from the past 240 hour(s))  Culture, blood (Routine x 2)     Status: None (Preliminary result)   Collection Time: 11/08/16 10:24 AM  Result Value Ref Range Status   Specimen Description BLOOD RIGHT ARM DRAWN BY IV THERAPY DRAWN BY RN  Final   Special Requests BOTTLES DRAWN AEROBIC AND ANAEROBIC 6CC  Final   Culture NO GROWTH 4 DAYS  Final   Report Status PENDING  Incomplete  Culture, blood (Routine x 2)     Status: Abnormal   Collection Time: 11/08/16 10:54 AM  Result Value Ref Range Status   Specimen Description BLOOD BLOOD LEFT WRIST  Final   Special Requests BOTTLES DRAWN AEROBIC AND ANAEROBIC 6CC  Final   Culture  Setup Time   Final    GRAM POSITIVE RODS AEROBIC BOTTLE Gram Stain  Report Called to,Read Back By and Verified With: MAYS,J AT 0745 BY HUFFINES,S ON 11/10/16.    Culture (A)  Final    DIPHTHEROIDS(CORYNEBACTERIUM SPECIES) Standardized susceptibility testing for this organism is not available. THE SIGNIFICANCE OF ISOLATING THIS ORGANISM FROM A SINGLE SET OF BLOOD CULTURES WHEN MULTIPLE SETS ARE DRAWN IS UNCERTAIN. PLEASE NOTIFY THE MICROBIOLOGY DEPARTMENT WITHIN ONE WEEK IF SPECIATION AND SENSITIVITIES ARE REQUIRED. Performed at [facility], [place] [address] [place], [place] [address]    Report Status 11/12/2016 FINAL  Final  Blood Culture ID Panel (Reflexed)     Status: None   Collection Time: 11/08/16 10:54 AM  Result Value Ref Range Status   Enterococcus species NOT DETECTED NOT DETECTED Final   Listeria monocytogenes NOT DETECTED NOT DETECTED Final   Staphylococcus species NOT DETECTED NOT DETECTED Final   Staphylococcus aureus NOT DETECTED NOT DETECTED Final   Streptococcus species NOT DETECTED NOT DETECTED Final   Streptococcus agalactiae NOT DETECTED NOT DETECTED Final   Streptococcus pneumoniae NOT DETECTED NOT DETECTED Final   Streptococcus pyogenes NOT DETECTED NOT DETECTED Final   Acinetobacter baumannii NOT DETECTED NOT DETECTED Final   Enterobacteriaceae species NOT DETECTED NOT DETECTED Final   Enterobacter cloacae complex NOT DETECTED NOT DETECTED Final   Escherichia coli NOT DETECTED NOT DETECTED Final   Klebsiella oxytoca NOT DETECTED NOT DETECTED Final   Klebsiella pneumoniae NOT DETECTED NOT DETECTED Final   Proteus species NOT DETECTED NOT DETECTED Final   Serratia marcescens NOT DETECTED NOT DETECTED Final   Haemophilus influenzae NOT DETECTED NOT DETECTED Final   Neisseria meningitidis NOT DETECTED NOT DETECTED Final   Pseudomonas aeruginosa NOT DETECTED NOT DETECTED Final   Candida albicans NOT DETECTED NOT DETECTED Final   Candida glabrata NOT DETECTED NOT DETECTED Final   Candida krusei NOT DETECTED NOT DETECTED Final    Candida parapsilosis NOT DETECTED NOT DETECTED Final   Candida tropicalis NOT DETECTED NOT DETECTED Final    Comment: Performed at [facility], [place] [address], [place], [place] [address]  Respiratory Panel by PCR  Status: None   Collection Time: 11/08/16  1:39 PM  Result Value Ref Range Status   Adenovirus NOT DETECTED NOT DETECTED Final   Coronavirus 229E NOT DETECTED NOT DETECTED Final   Coronavirus HKU1 NOT DETECTED NOT DETECTED Final   Coronavirus NL63 NOT DETECTED NOT DETECTED Final   Coronavirus OC43 NOT DETECTED NOT DETECTED Final   Metapneumovirus NOT DETECTED NOT DETECTED Final   Rhinovirus / Enterovirus NOT DETECTED NOT DETECTED Final   Influenza A NOT DETECTED NOT DETECTED Final   Influenza B NOT DETECTED NOT DETECTED Final   Parainfluenza Virus 1 NOT DETECTED NOT DETECTED Final   Parainfluenza Virus 2 NOT DETECTED NOT DETECTED Final   Parainfluenza Virus 3 NOT DETECTED NOT DETECTED Final   Parainfluenza Virus 4 NOT DETECTED NOT DETECTED Final   Respiratory Syncytial Virus NOT DETECTED NOT DETECTED Final   Bordetella pertussis NOT DETECTED NOT DETECTED Final   Chlamydophila pneumoniae NOT DETECTED NOT DETECTED Final   Mycoplasma pneumoniae NOT DETECTED NOT DETECTED Final    Comment: Performed at [facility], [place] [address], [place], [place] [address]  MRSA PCR Screening     Status: None   Collection Time: 11/08/16  3:35 PM  Result Value Ref Range Status   MRSA by PCR NEGATIVE NEGATIVE Final    Comment:        The GeneXpert MRSA Assay (FDA approved for NASAL specimens only), is one component of a comprehensive MRSA colonization surveillance program. It is not intended to diagnose MRSA infection nor to guide or monitor treatment for MRSA infections.   Culture, blood (routine x 2)     Status: None (Preliminary result)   Collection Time: 11/10/16  4:29 PM  Result Value Ref Range Status   Specimen Description BLOOD RIGHT HAND  Final   Special Requests  BOTTLES DRAWN AEROBIC AND ANAEROBIC 6CC  Final   Culture NO GROWTH 2 DAYS  Final   Report Status PENDING  Incomplete  Culture, blood (routine x 2)     Status: None (Preliminary result)   Collection Time: 11/10/16  4:31 PM  Result Value Ref Range Status   Specimen Description BLOOD LEFT HAND  Final   Special Requests BOTTLES DRAWN AEROBIC AND ANAEROBIC [place]  Final   Culture NO GROWTH 2 DAYS  Final   Report Status PENDING  Incomplete    Today   Subjective   Lesia Hausen today In bed, demented baseline, unreliable historian but says no to headache, chest or abdominal pain.   Objective   Blood pressure (!) 119/59, pulse 100, temperature 98.8 F (37.1 C), temperature source Axillary, resp. rate 18, height 5\' 2"  (1.575 m), weight 62.7 kg (138 lb 3.7 oz), SpO2 95 %.   Intake/Output Summary (Last 24 hours) at 11/12/16 1058 Last data filed at 11/12/16 0931  Gross per 24 hour  Intake             2270 ml  Output                0 ml  Net             2270 ml    Exam Awake But pleasantly confused, No new F.N deficits, Normal affect [place].AT,PERRAL Supple Neck,No JVD, No cervical lymphadenopathy appriciated.  Symmetrical Chest wall movement, Good air movement bilaterally, CTAB RRR,No Gallops,Rubs or new Murmurs, No Parasternal Heave +ve B.Sounds, Abd Soft, Non tender, No organomegaly appriciated, No rebound -guarding or rigidity. No Cyanosis, Clubbing or edema, No new Rash  or bruise   Data Review   CBC w Diff: Lab Results  Component Value Date   WBC 9.1 11/12/2016   HGB 8.8 (L) 11/12/2016   HCT 27.0 (L) 11/12/2016   PLT 176 11/12/2016   LYMPHOPCT 14 11/08/2016   MONOPCT 7 11/08/2016   EOSPCT 0 11/08/2016   BASOPCT 0 11/08/2016    CMP: Lab Results  Component Value Date   NA 143 11/12/2016   K 3.5 11/12/2016   CL 114 (H) 11/12/2016   CO2 21 (L) 11/12/2016   BUN 10 11/12/2016   CREATININE 0.88 11/12/2016   PROT 8.1 11/08/2016   ALBUMIN 3.4 (L) 11/08/2016   BILITOT 0.9  11/08/2016   ALKPHOS 107 11/08/2016   AST 29 11/08/2016   ALT 42 11/08/2016  .   Total Time in preparing paper work, data evaluation and todays exam - 35 minutes  Thurnell Lose M.D on 11/12/2016 at 10:58 AM  [facility]   Office  [phone]

## 2016-11-12 NOTE — Discharge Instructions (Signed)
Follow with Primary MD Madelyn Brunner, MD in 7 days   Get CBC, CMP, 2 view Chest X ray checked  by Primary MD or SNF MD in 5-7 days ( we routinely change or add medications that can affect your baseline labs and fluid status, therefore we recommend that you get the mentioned basic workup next visit with your PCP, your PCP may decide not to get them or add new tests based on their clinical decision)   Activity: As tolerated with Full fall precautions use walker/cane & assistance as needed   Disposition SNF     Diet:   DIET DYS 2 with feeding assistance and aspiration precautions.  For Heart failure patients - Check your Weight same time everyday, if you gain over 2 pounds, or you develop in leg swelling, experience more shortness of breath or chest pain, call your Primary MD immediately. Follow Cardiac Low Salt Diet and 1.5 lit/day fluid restriction.   On your next visit with your primary care physician please Get Medicines reviewed and adjusted.   Please request your Prim.MD to go over all Hospital Tests and Procedure/Radiological results at the follow up, please get all Hospital records sent to your Prim MD by signing hospital release before you go home.   If you experience worsening of your admission symptoms, develop shortness of breath, life threatening emergency, suicidal or homicidal thoughts you must seek medical attention immediately by calling 911 or calling your MD immediately  if symptoms less severe.  You Must read complete instructions/literature along with all the possible adverse reactions/side effects for all the Medicines you take and that have been prescribed to you. Take any new Medicines after you have completely understood and accpet all the possible adverse reactions/side effects.   Do not drive, operate heavy machinery, perform activities at heights, swimming or participation in water activities or provide baby sitting services if your were admitted for syncope or  siezures until you have seen by Primary MD or a Neurologist and advised to do so again.  Do not drive when taking Pain medications.    Do not take more than prescribed Pain, Sleep and Anxiety Medications  Special Instructions: If you have smoked or chewed Tobacco  in the last 2 yrs please stop smoking, stop any regular Alcohol  and or any Recreational drug use.  Wear Seat belts while driving.   Please note  You were cared for by a hospitalist during your hospital stay. If you have any questions about your discharge medications or the care you received while you were in the hospital after you are discharged, you can call the unit and asked to speak with the hospitalist on call if the hospitalist that took care of you is not available. Once you are discharged, your primary care physician will handle any further medical issues. Please note that NO REFILLS for any discharge medications will be authorized once you are discharged, as it is imperative that you return to your primary care physician (or establish a relationship with a primary care physician if you do not have one) for your aftercare needs so that they can reassess your need for medications and monitor your lab values.

## 2016-11-12 NOTE — Clinical Social Work Note (Addendum)
Pt d/c today back to [facility]. CSW left voicemail for pt's son/guardian, Gwyndolyn Saxon. Attempted to call pt's other son, Jeneen Rinks but generic voicemail set up. Facility notified. Will transport via [employer].  Benay Pike, [place]

## 2016-11-14 LAB — CULTURE, BLOOD (ROUTINE X 2): CULTURE: NO GROWTH

## 2016-11-15 LAB — CULTURE, BLOOD (ROUTINE X 2)
CULTURE: NO GROWTH
Culture: NO GROWTH

## 2017-04-21 ENCOUNTER — Other Ambulatory Visit (HOSPITAL_COMMUNITY): Payer: Self-pay | Admitting: Hospice and Palliative Medicine

## 2017-04-21 DIAGNOSIS — R918 Other nonspecific abnormal finding of lung field: Secondary | ICD-10-CM

## 2017-05-04 ENCOUNTER — Ambulatory Visit (HOSPITAL_COMMUNITY)
Admission: RE | Admit: 2017-05-04 | Discharge: 2017-05-04 | Disposition: A | Discharge status: Home / Self Care | Payer: Medicare Other | Source: Ambulatory Visit | Attending: Hospice and Palliative Medicine | Admitting: Hospice and Palliative Medicine

## 2017-05-04 DIAGNOSIS — K449 Diaphragmatic hernia without obstruction or gangrene: Secondary | ICD-10-CM | POA: Insufficient documentation

## 2017-05-04 DIAGNOSIS — R918 Other nonspecific abnormal finding of lung field: Secondary | ICD-10-CM | POA: Diagnosis present

## 2017-05-04 DIAGNOSIS — Q676 Pectus excavatum: Secondary | ICD-10-CM | POA: Diagnosis not present

## 2017-05-04 DIAGNOSIS — J219 Acute bronchiolitis, unspecified: Secondary | ICD-10-CM | POA: Diagnosis not present

## 2017-05-04 DIAGNOSIS — J470 Bronchiectasis with acute lower respiratory infection: Secondary | ICD-10-CM | POA: Diagnosis not present

## 2017-09-23 ENCOUNTER — Ambulatory Visit (INDEPENDENT_AMBULATORY_CARE_PROVIDER_SITE_OTHER): Payer: Medicare Other | Admitting: Internal Medicine

## 2017-09-23 DIAGNOSIS — L89899 Pressure ulcer of other site, unspecified stage: Secondary | ICD-10-CM | POA: Insufficient documentation

## 2017-09-23 DIAGNOSIS — Z86711 Personal history of pulmonary embolism: Secondary | ICD-10-CM | POA: Diagnosis not present

## 2017-09-23 DIAGNOSIS — L89324 Pressure ulcer of left buttock, stage 4: Secondary | ICD-10-CM

## 2017-09-23 DIAGNOSIS — L89894 Pressure ulcer of other site, stage 4: Secondary | ICD-10-CM | POA: Diagnosis present
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
## 2017-09-30 ENCOUNTER — Telehealth: Payer: Self-pay | Admitting: Internal Medicine

## 2017-09-30 NOTE — Telephone Encounter (Signed)
I received a call today from Dr. Renard Hamper, Ms. Soo's wound care specialist.  He said that the wound has been improving over the past few weeks and there is no exposed bone.  He was definitely concerned because there had been exposed bone and her inflammatory markers remain elevated.  We discussed management options and agreed upon extending antibiotic therapy for 2 more weeks with vancomycin and restarting Pipracil and tazobactam.

## 2017-11-14 ENCOUNTER — Inpatient Hospital Stay (HOSPITAL_COMMUNITY)
Admission: EM | Admit: 2017-11-14 | Discharge: 2017-11-20 | DRG: 193 | Disposition: A | Discharge status: Hospice | Attending: Family Medicine | Admitting: Family Medicine

## 2017-11-14 ENCOUNTER — Encounter (HOSPITAL_COMMUNITY): Payer: Self-pay

## 2017-11-14 ENCOUNTER — Emergency Department (HOSPITAL_COMMUNITY)

## 2017-11-14 DIAGNOSIS — F329 Major depressive disorder, single episode, unspecified: Secondary | ICD-10-CM | POA: Diagnosis present

## 2017-11-14 DIAGNOSIS — R634 Abnormal weight loss: Secondary | ICD-10-CM | POA: Diagnosis present

## 2017-11-14 DIAGNOSIS — Z7401 Bed confinement status: Secondary | ICD-10-CM

## 2017-11-14 DIAGNOSIS — L89324 Pressure ulcer of left buttock, stage 4: Secondary | ICD-10-CM | POA: Diagnosis present

## 2017-11-14 DIAGNOSIS — Z9181 History of falling: Secondary | ICD-10-CM | POA: Diagnosis not present

## 2017-11-14 DIAGNOSIS — F0390 Unspecified dementia without behavioral disturbance: Secondary | ICD-10-CM | POA: Diagnosis present

## 2017-11-14 DIAGNOSIS — N179 Acute kidney failure, unspecified: Secondary | ICD-10-CM | POA: Diagnosis present

## 2017-11-14 DIAGNOSIS — E870 Hyperosmolality and hypernatremia: Secondary | ICD-10-CM | POA: Diagnosis present

## 2017-11-14 DIAGNOSIS — Z8614 Personal history of Methicillin resistant Staphylococcus aureus infection: Secondary | ICD-10-CM

## 2017-11-14 DIAGNOSIS — J181 Lobar pneumonia, unspecified organism: Secondary | ICD-10-CM | POA: Diagnosis present

## 2017-11-14 DIAGNOSIS — Z515 Encounter for palliative care: Secondary | ICD-10-CM | POA: Diagnosis not present

## 2017-11-14 DIAGNOSIS — R627 Adult failure to thrive: Secondary | ICD-10-CM | POA: Diagnosis present

## 2017-11-14 DIAGNOSIS — Z86711 Personal history of pulmonary embolism: Secondary | ICD-10-CM | POA: Diagnosis not present

## 2017-11-14 DIAGNOSIS — Z681 Body mass index (BMI) 19 or less, adult: Secondary | ICD-10-CM

## 2017-11-14 DIAGNOSIS — L8990 Pressure ulcer of unspecified site, unspecified stage: Secondary | ICD-10-CM

## 2017-11-14 DIAGNOSIS — E871 Hypo-osmolality and hyponatremia: Secondary | ICD-10-CM | POA: Diagnosis present

## 2017-11-14 DIAGNOSIS — L89154 Pressure ulcer of sacral region, stage 4: Secondary | ICD-10-CM | POA: Diagnosis present

## 2017-11-14 DIAGNOSIS — R7989 Other specified abnormal findings of blood chemistry: Secondary | ICD-10-CM | POA: Diagnosis not present

## 2017-11-14 DIAGNOSIS — E785 Hyperlipidemia, unspecified: Secondary | ICD-10-CM | POA: Diagnosis present

## 2017-11-14 DIAGNOSIS — E876 Hypokalemia: Secondary | ICD-10-CM | POA: Diagnosis present

## 2017-11-14 DIAGNOSIS — E860 Dehydration: Secondary | ICD-10-CM | POA: Diagnosis present

## 2017-11-14 DIAGNOSIS — I96 Gangrene, not elsewhere classified: Secondary | ICD-10-CM | POA: Diagnosis present

## 2017-11-14 DIAGNOSIS — E878 Other disorders of electrolyte and fluid balance, not elsewhere classified: Secondary | ICD-10-CM | POA: Diagnosis present

## 2017-11-14 DIAGNOSIS — Z7189 Other specified counseling: Secondary | ICD-10-CM | POA: Diagnosis not present

## 2017-11-14 DIAGNOSIS — Z66 Do not resuscitate: Secondary | ICD-10-CM | POA: Diagnosis present

## 2017-11-14 DIAGNOSIS — Z87891 Personal history of nicotine dependence: Secondary | ICD-10-CM

## 2017-11-14 DIAGNOSIS — Z7901 Long term (current) use of anticoagulants: Secondary | ICD-10-CM

## 2017-11-14 DIAGNOSIS — L89894 Pressure ulcer of other site, stage 4: Secondary | ICD-10-CM | POA: Diagnosis present

## 2017-11-14 DIAGNOSIS — Y95 Nosocomial condition: Secondary | ICD-10-CM | POA: Diagnosis present

## 2017-11-14 DIAGNOSIS — J189 Pneumonia, unspecified organism: Secondary | ICD-10-CM | POA: Diagnosis present

## 2017-11-14 HISTORY — DX: Other chronic cystitis with hematuria: N30.21

## 2017-11-14 HISTORY — DX: Disorder of kidney and ureter, unspecified: N28.9

## 2017-11-14 HISTORY — DX: Unspecified urinary incontinence: R32

## 2017-11-14 HISTORY — DX: Other symptoms and signs involving cognitive functions and awareness: R41.89

## 2017-11-14 HISTORY — DX: Pressure ulcer of unspecified site, stage 4: L89.94

## 2017-11-14 HISTORY — DX: Personal history of Methicillin resistant Staphylococcus aureus infection: Z86.14

## 2017-11-14 LAB — COMPREHENSIVE METABOLIC PANEL
ALBUMIN: 2.6 g/dL — AB (ref 3.5–5.0)
ALT: 43 U/L (ref 14–54)
AST: 30 U/L (ref 15–41)
Alkaline Phosphatase: 77 U/L (ref 38–126)
Anion gap: 20 — ABNORMAL HIGH (ref 5–15)
BILIRUBIN TOTAL: 0.4 mg/dL (ref 0.3–1.2)
BUN: 113 mg/dL — AB (ref 6–20)
CHLORIDE: 129 mmol/L — AB (ref 101–111)
CO2: 20 mmol/L — AB (ref 22–32)
Calcium: 8 mg/dL — ABNORMAL LOW (ref 8.9–10.3)
Creatinine, Ser: 3.94 mg/dL — ABNORMAL HIGH (ref 0.44–1.00)
GFR calc Af Amer: 12 mL/min — ABNORMAL LOW (ref >60)
GFR calc non Af Amer: 10 mL/min — ABNORMAL LOW (ref >60)
GLUCOSE: 154 mg/dL — AB (ref 65–99)
POTASSIUM: 3.2 mmol/L — AB (ref 3.5–5.1)
SODIUM: 169 mmol/L — AB (ref 135–145)
TOTAL PROTEIN: 7.3 g/dL (ref 6.5–8.1)

## 2017-11-14 LAB — I-STAT CG4 LACTIC ACID, ED: Lactic Acid, Venous: 1.19 mmol/L (ref 0.5–1.9)

## 2017-11-14 LAB — CBC WITH DIFFERENTIAL/PLATELET
Basophils Absolute: 0 10*3/uL (ref 0.0–0.1)
Basophils Relative: 0 %
EOS PCT: 0 %
Eosinophils Absolute: 0 10*3/uL (ref 0.0–0.7)
HEMATOCRIT: 36.5 % (ref 36.0–46.0)
Hemoglobin: 10.6 g/dL — ABNORMAL LOW (ref 12.0–15.0)
LYMPHS ABS: 1 10*3/uL (ref 0.7–4.0)
LYMPHS PCT: 6 %
MCH: 25.5 pg — AB (ref 26.0–34.0)
MCHC: 29 g/dL — AB (ref 30.0–36.0)
MCV: 87.7 fL (ref 78.0–100.0)
MONO ABS: 1 10*3/uL (ref 0.1–1.0)
MONOS PCT: 6 %
NEUTROS ABS: 14 10*3/uL — AB (ref 1.7–7.7)
Neutrophils Relative %: 88 %
PLATELETS: 275 10*3/uL (ref 150–400)
RBC: 4.16 MIL/uL (ref 3.87–5.11)
RDW: 19.5 % — AB (ref 11.5–15.5)
WBC: 16.1 10*3/uL — ABNORMAL HIGH (ref 4.0–10.5)

## 2017-11-14 LAB — URINALYSIS, ROUTINE W REFLEX MICROSCOPIC
BACTERIA UA: NONE SEEN
Bilirubin Urine: NEGATIVE
GLUCOSE, UA: NEGATIVE mg/dL
KETONES UR: NEGATIVE mg/dL
Leukocytes, UA: NEGATIVE
Nitrite: NEGATIVE
PH: 5 (ref 5.0–8.0)
Protein, ur: NEGATIVE mg/dL
Specific Gravity, Urine: 1.02 (ref 1.005–1.030)

## 2017-11-14 LAB — BASIC METABOLIC PANEL
Anion gap: 18 — ABNORMAL HIGH (ref 5–15)
BUN: 115 mg/dL — AB (ref 6–20)
CALCIUM: 7.8 mg/dL — AB (ref 8.9–10.3)
CO2: 20 mmol/L — ABNORMAL LOW (ref 22–32)
CREATININE: 3.92 mg/dL — AB (ref 0.44–1.00)
Chloride: 129 mmol/L — ABNORMAL HIGH (ref 101–111)
GFR calc non Af Amer: 10 mL/min — ABNORMAL LOW (ref >60)
GFR, EST AFRICAN AMERICAN: 12 mL/min — AB (ref >60)
Glucose, Bld: 135 mg/dL — ABNORMAL HIGH (ref 65–99)
Potassium: 2.9 mmol/L — ABNORMAL LOW (ref 3.5–5.1)
SODIUM: 167 mmol/L — AB (ref 135–145)

## 2017-11-14 MED ORDER — IPRATROPIUM-ALBUTEROL 0.5-2.5 (3) MG/3ML IN SOLN
3.0000 mL | Freq: Four times a day (QID) | RESPIRATORY_TRACT | Status: DC
Start: 2017-11-14 — End: 2017-11-19
  Administered 2017-11-14 – 2017-11-19 (×16): 3 mL via RESPIRATORY_TRACT
  Filled 2017-11-14 (×17): qty 3

## 2017-11-14 MED ORDER — PROMETHAZINE HCL 25 MG/ML IJ SOLN
12.5000 mg | Freq: Four times a day (QID) | INTRAMUSCULAR | Status: DC | PRN
Start: 2017-11-14 — End: 2017-11-20
  Administered 2017-11-19: 12.5 mg via INTRAVENOUS
  Filled 2017-11-14: qty 1

## 2017-11-14 MED ORDER — SENNA 8.6 MG PO TABS
1.0000 | ORAL_TABLET | Freq: Every day | ORAL | Status: DC
  Administered 2017-11-14 – 2017-11-17 (×4): 8.6 mg via ORAL
  Filled 2017-11-14 (×6): qty 1

## 2017-11-14 MED ORDER — DEXTROSE 5 % IV SOLN
500.0000 mg | Freq: Once | INTRAVENOUS | Status: AC
  Administered 2017-11-14: 500 mg via INTRAVENOUS
  Filled 2017-11-14: qty 500

## 2017-11-14 MED ORDER — APIXABAN 5 MG PO TABS
5.0000 mg | ORAL_TABLET | Freq: Two times a day (BID) | ORAL | Status: DC
  Administered 2017-11-14 – 2017-11-15 (×2): 5 mg via ORAL
  Filled 2017-11-14 (×6): qty 1

## 2017-11-14 MED ORDER — SODIUM CHLORIDE 0.9 % IV BOLUS (SEPSIS)
1000.0000 mL | Freq: Once | INTRAVENOUS | Status: AC
  Administered 2017-11-14: 1000 mL via INTRAVENOUS

## 2017-11-14 MED ORDER — PRO-STAT SUGAR FREE PO LIQD
30.0000 mL | Freq: Two times a day (BID) | ORAL | Status: DC
  Administered 2017-11-14 – 2017-11-17 (×5): 30 mL via ORAL
  Filled 2017-11-14 (×11): qty 30

## 2017-11-14 MED ORDER — POTASSIUM CHLORIDE IN NACL 20-0.9 MEQ/L-% IV SOLN
INTRAVENOUS | Status: DC
  Administered 2017-11-14: 21:00:00 via INTRAVENOUS

## 2017-11-14 MED ORDER — DEXTROSE 5 % IV SOLN
1.0000 g | Freq: Once | INTRAVENOUS | Status: AC
  Administered 2017-11-15: 1 g via INTRAVENOUS
  Filled 2017-11-14: qty 1

## 2017-11-14 MED ORDER — SERTRALINE HCL 50 MG PO TABS
25.0000 mg | ORAL_TABLET | Freq: Every day | ORAL | Status: DC
  Administered 2017-11-15 – 2017-11-18 (×4): 25 mg via ORAL
  Filled 2017-11-14 (×5): qty 1

## 2017-11-14 MED ORDER — DILTIAZEM HCL 25 MG/5ML IV SOLN
10.0000 mg | Freq: Once | INTRAVENOUS | Status: AC
  Administered 2017-11-14: 10 mg via INTRAVENOUS
  Filled 2017-11-14: qty 5

## 2017-11-14 MED ORDER — DEXTROSE 5 % IV SOLN: 1.0000 g | Freq: Once | INTRAVENOUS | Status: DC

## 2017-11-14 MED ORDER — VANCOMYCIN HCL IN DEXTROSE 1-5 GM/200ML-% IV SOLN
1000.0000 mg | Freq: Once | INTRAVENOUS | Status: AC
  Administered 2017-11-14: 1000 mg via INTRAVENOUS
  Filled 2017-11-14: qty 200

## 2017-11-14 MED ORDER — SODIUM CHLORIDE 0.9 % IV BOLUS (SEPSIS)
500.0000 mL | Freq: Once | INTRAVENOUS | Status: AC
  Administered 2017-11-14: 500 mL via INTRAVENOUS

## 2017-11-14 NOTE — ED Notes (Signed)
CRITICAL VALUE ALERT  Critical Value:  Na 169  Date & Time Notied: 11/14/17 1558  Provider Notified  Eulis Foster  Orders Received/Actions taken

## 2017-11-14 NOTE — ED Triage Notes (Signed)
Pt brought in from [facility] due to decline in LOC and abnormal labs. Pt had PICC line inserted to right upper arm prior to arrival. Pt has been treated for pneumonia. EMS reports that she has been declining over the last 3 days. Decrease verbal response . Completing dose of rocephin when EMS arrived. No verbal to voice, but responds to pain. Eyes deviated to right

## 2017-11-14 NOTE — ED Provider Notes (Addendum)
[facility] Provider Note   CSN: [id number] Arrival date & time: 11/14/17  1223     History   Chief Complaint Chief Complaint  Patient presents with  . Altered Mental Status    HPI Shari Dean is a 77 y.o. female.  She is here for evaluation of altered mental status, decreased oral intake, and abnormal labs.  She had labs drawn today which have returned showing significant hyponatremia, elevated creatinine, and elevated BUN.  She is reportedly currently being treated with IV Rocephin via PICC line, for "pneumonia."  She is completely unable to give any history.  No one is here with her.  Level 5 caveat-dementia  HPI  Past Medical History:  Diagnosis Date  . Chronic cystitis   . Chronic cystitis with hematuria   . Chronic pain   . Dementia   . Depression   . Dysphagia   . Epigastric pain   . Hx MRSA infection   . Hyperlipidemia   . Hyperlipidemia   . Impaired cognition   . Incontinence   . 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 Norovirus   . Osteoarthritis   . Osteoarthritis   . Osteopenia   . Pressure ulcer of unspecified site, stage 4 ([place])   . Renal disorder    kidney failure    Patient Active Problem List   Diagnosis Date Noted  . Chronic pain 11/20/2017  . DNR (do not resuscitate) 11/20/2017  . Comfort measures only status 11/20/2017  . Pressure injury of skin 11/18/2017  . Encounter for hospice care discussion   . HCAP (healthcare-associated pneumonia) 11/14/2017  . Dehydration 11/14/2017  . Decubitus ulcer of left foot 09/23/2017  . Decubitus ulcer of ischium, left, stage IV ([place]) 09/23/2017  . History of pulmonary embolus (PE) 09/23/2017  . Palliative care encounter   . Goals of care, counseling/discussion   . DNR (do not resuscitate) discussion   . AKI (acute kidney injury) ([place])   . Hypernatremia   . Dementia 07/17/2012  . Osteoarthritis 07/17/2012    History reviewed. No  pertinent surgical history.  OB History    No data available       Home Medications    Prior to Admission medications   Not on File    Family History Family History  Problem Relation Age of Onset  . Diabetes type II Unknown   . Multiple sclerosis Unknown   . Dementia Unknown     Social History Social History   Tobacco Use  . Smoking status: Former Research scientist (life sciences)  . Smokeless tobacco: Never Used  Substance Use Topics  . Alcohol use: No  . Drug use: No     Allergies   Patient has no known allergies.   Review of Systems Review of Systems  Unable to perform ROS: Dementia     Physical Exam Updated Vital Signs BP (!) 118/93 (BP Location: Left Arm)   Pulse 96   Temp 98.3 F (36.8 C) (Axillary)   Resp 18   Ht 5\' 7"  (1.702 m)   Wt 55.3 kg (121 lb 14.6 oz)   SpO2 96%   BMI 19.09 kg/m   Physical Exam  Constitutional: She appears well-developed. She appears distressed (Uncomfortable during physical examination).  Frail, elderly  HENT:  Head: Normocephalic and atraumatic.  Dry oral mucous membranes without visible foreign body.  Eyes: Conjunctivae and EOM are normal. Pupils are equal, round, and reactive to light.  Fixed right gaze preference  Neck: Normal range of motion and phonation normal. Neck supple.  Cardiovascular:  Regular tachycardia  Pulmonary/Chest: She exhibits no tenderness.  Shallow respirations, decreased air movement bilaterally.  Abdominal: Soft. She exhibits no distension. There is no tenderness. There is no guarding.  Musculoskeletal:  Flexion contractures arms and legs bilaterally.  Left foot with plantar ulceration beneath first MTP joint.  No significant associated drainage, redness or fluctuance.  Neurological:  Eyes open, does not respond to voice or follow commands  Skin: Skin is warm and dry.  Left buttocks stage III decubitus, with granulation tissue at base, no associated fluctuance, redness or drainage.  Psychiatric:  Obtunded    Nursing note and vitals reviewed.    ED Treatments / Results  Labs (all labs ordered are listed, but only abnormal results are displayed) Labs Reviewed  MRSA PCR SCREENING - Abnormal; Notable for the following components:      Result Value   MRSA by PCR POSITIVE (*)    All other components within normal limits  COMPREHENSIVE METABOLIC PANEL - Abnormal; Notable for the following components:   Sodium 169 (*)    Potassium 3.2 (*)    Chloride 129 (*)    CO2 20 (*)    Glucose, Bld 154 (*)    BUN 113 (*)    Creatinine, Ser 3.94 (*)    Calcium 8.0 (*)    Albumin 2.6 (*)    GFR calc non Af Amer 10 (*)    GFR calc Af Amer 12 (*)    Anion gap 20 (*)    All other components within normal limits  CBC WITH DIFFERENTIAL/PLATELET - Abnormal; Notable for the following components:   WBC 16.1 (*)    Hemoglobin 10.6 (*)    MCH 25.5 (*)    MCHC 29.0 (*)    RDW 19.5 (*)    Neutro Abs 14.0 (*)    All other components within normal limits  URINALYSIS, ROUTINE W REFLEX MICROSCOPIC - Abnormal; Notable for the following components:   Color, Urine AMBER (*)    APPearance HAZY (*)    Hgb urine dipstick LARGE (*)    Squamous Epithelial / LPF 0-5 (*)    All other components within normal limits  BASIC METABOLIC PANEL - Abnormal; Notable for the following components:   Sodium 165 (*)    Potassium 3.2 (*)    Chloride >130 (*)    CO2 15 (*)    Glucose, Bld 102 (*)    BUN 105 (*)    Creatinine, Ser 3.31 (*)    Calcium 7.5 (*)    GFR calc non Af Amer 13 (*)    GFR calc Af Amer 15 (*)    All other components within normal limits  CBC WITH DIFFERENTIAL/PLATELET - Abnormal; Notable for the following components:   WBC 12.4 (*)    RBC 3.41 (*)    Hemoglobin 8.6 (*)    HCT 29.7 (*)    MCH 25.2 (*)    MCHC 29.0 (*)    RDW 19.3 (*)    Neutro Abs 11.0 (*)    All other components within normal limits  BASIC METABOLIC PANEL - Abnormal; Notable for the following components:   Sodium 167 (*)     Potassium 2.9 (*)    Chloride 129 (*)    CO2 20 (*)    Glucose, Bld 135 (*)    BUN 115 (*)    Creatinine, Ser 3.92 (*)  Calcium 7.8 (*)    GFR calc non Af Amer 10 (*)    GFR calc Af Amer 12 (*)    Anion gap 18 (*)    All other components within normal limits  CBC - Abnormal; Notable for the following components:   WBC 11.2 (*)    RBC 3.38 (*)    Hemoglobin 8.7 (*)    HCT 29.4 (*)    MCH 25.7 (*)    MCHC 29.6 (*)    RDW 19.2 (*)    All other components within normal limits  BASIC METABOLIC PANEL - Abnormal; Notable for the following components:   Sodium 158 (*)    Potassium 2.6 (*)    Chloride 130 (*)    CO2 16 (*)    Glucose, Bld 125 (*)    BUN 71 (*)    Creatinine, Ser 2.48 (*)    Calcium 8.3 (*)    GFR calc non Af Amer 18 (*)    GFR calc Af Amer 21 (*)    All other components within normal limits  BASIC METABOLIC PANEL - Abnormal; Notable for the following components:   Potassium 2.8 (*)    Chloride 120 (*)    CO2 14 (*)    Glucose, Bld 118 (*)    BUN 48 (*)    Creatinine, Ser 1.89 (*)    Calcium 7.6 (*)    GFR calc non Af Amer 25 (*)    GFR calc Af Amer 29 (*)    All other components within normal limits  CBC - Abnormal; Notable for the following components:   RBC 3.10 (*)    Hemoglobin 8.1 (*)    HCT 26.0 (*)    RDW 18.9 (*)    All other components within normal limits  CULTURE, BLOOD (ROUTINE X 2)  CULTURE, BLOOD (ROUTINE X 2)  URINE CULTURE  STREP PNEUMONIAE URINARY ANTIGEN  MAGNESIUM  PHOSPHORUS  MAGNESIUM  I-STAT CG4 LACTIC ACID, ED  I-STAT CG4 LACTIC ACID, ED    EKG  EKG Interpretation  Date/Time:  Saturday November 14 2017 12:27:46 EST Ventricular Rate:  126 PR Interval:    QRS Duration: 78 QT Interval:  336 QTC Calculation: 487 R Axis:   8 Text Interpretation:  Sinus tachycardia Low voltage, precordial leads Borderline T abnormalities, diffuse leads Borderline prolonged QT interval since last tracing no significant change Confirmed by  Daleen Bo [id number]) on 11/14/2017 1:11:01 PM       Radiology No results found.  Procedures .Critical Care Performed by: Daleen Bo, MD Authorized by: Daleen Bo, MD   Critical care provider statement:    Critical care time (minutes):  40   Critical care start time:  11/14/2017 1:01 PM   Critical care time was exclusive of:  Separately billable procedures and treating other patients   Critical care was necessary to treat or prevent imminent or life-threatening deterioration of the following conditions:  Endocrine crisis and metabolic crisis   Critical care was time spent personally by me on the following activities:  Blood draw for specimens, examination of patient, discussions with consultants, evaluation of patient's response to treatment, obtaining history from patient or surrogate, ordering and performing treatments and interventions, ordering and review of laboratory studies, ordering and review of radiographic studies, re-evaluation of patient's condition and review of old charts     (including critical care time)  Medications Ordered in ED Medications  potassium chloride 10 mEq in 100 mL IVPB (10 mEq Intravenous Not  Given 11/17/17 1854)  sodium chloride 0.9 % bolus 1,000 mL (0 mLs Intravenous Stopped 11/14/17 1516)    And  sodium chloride 0.9 % bolus 500 mL (0 mLs Intravenous Stopped 11/14/17 1516)  azithromycin (ZITHROMAX) 500 mg in dextrose 5 % 250 mL IVPB (0 mg Intravenous Stopped 11/14/17 1718)  ceFEPIme (MAXIPIME) 1 g in dextrose 5 % 50 mL IVPB (0 g Intravenous Stopped 11/15/17 0200)  vancomycin (VANCOCIN) IVPB 1000 mg/200 mL premix (0 mg Intravenous Stopped 11/15/17 0112)  sodium chloride 0.9 % bolus 1,000 mL (1,000 mLs Intravenous New Bag/Given 11/14/17 2244)  diltiazem (CARDIZEM) injection 10 mg (10 mg Intravenous Given 11/14/17 2343)  potassium chloride 10 mEq in 100 mL IVPB (0 mEq Intravenous Stopped 11/17/17 1944)  potassium chloride 10 mEq in 100 mL IVPB (0 mEq  Intravenous Stopped 11/18/17 1310)  magnesium sulfate IVPB 2 g 50 mL (0 g Intravenous Stopped 11/18/17 1038)     Initial Impression / Assessment and Plan / ED Course  I have reviewed the triage vital signs and the nursing notes.  Pertinent labs & imaging results that were available during my care of the patient were reviewed by me and considered in my medical decision making (see chart for details).  Clinical Course as of Nov 23 1220  Sat Nov 14, 2017  1305 The patient is here for evaluation of altered mental status with abnormal labs indicating dehydration, and normal rectal temperature.  I suspect that she has severe dehydration, and doubt that she has sepsis.  Will screen for sepsis, initiate high volume fluid treat and assess for infection.  She does has decubiti, but did not appear to be causing her any problems at this time.  [EW]  1538 Elevated WBC: (!) 16.1 [EW]  1538 Abnormal Hgb urine dipstick: (!) LARGE [EW]  1538 Abnormal Hgb urine dipstick: (!) LARGE [EW]  1538 Elevated Sodium: (!!) 169 [EW]  1538 Elevated Chloride: (!) 129 [EW]  1539 Low Potassium: (!) 3.2 [EW]  1539 High Glucose: (!) 154 [EW]  1539 Elevated Creatinine: (!) 3.94 [EW]  1539 Elevated BUN: (!) 113 [EW]  1636 Normal Lactic Acid, Venous: 1.19 [EW]    Clinical Course User Index [EW] Daleen Bo, MD     No data found.  3:43 PM Reevaluation with update and discussion. After initial assessment and treatment, an updated evaluation reveals no change in clinical status.  Patient sons are here now and the report additional history, the patient was diagnosed with pneumonia yesterday and has had a dose of Rocephin and Levaquin.  She had a PICC line that was removed about 10 days ago, and they feel that she is not eating days.  They are confident that she will improved with current treatment and they are not willing at this point to change her CODE STATUS.  I instructed them that she was severely ill, and that she was  likely nearing the end of life.  All questions were answered. Daleen Bo    4:36 PM-Consult complete with Hospitalist. Patient case explained and discussed. She agrees to admit patient for further evaluation and treatment. Call ended at 16:50  Final Clinical Impressions(s) / ED Diagnoses   Final diagnoses:  Hypernatremia  AKI (acute kidney injury) ([place])  Dehydration  Community acquired pneumonia of left lower lobe of lung ([place])    Patient currently being treated with antibiotics for reported pneumonia, at her nursing facility.  He is extremely debilitated and has severe dementia.  She is a full code.  She has significant metabolic abnormalities consistent with severe dehydration complicated by hypernatremia, hyperchloremia, prerenal azotemia and acute renal failure.  Elevated white blood cell count with pneumonia present on x-ray imaging.  Vital signs abnormal with significant tachycardia but normal blood pressure.  Patient treated with high volume saline boluses in the ED, she will require admission for further treatment and monitoring.  The patient likely needs consultation by palliative care, to initiate palliative treatment in this severely demented elderly debilitated patient.  Nursing Notes Reviewed/ Care Coordinated Applicable Imaging Reviewed Interpretation of Laboratory Data incorporated into ED treatment  Plan: [place]  ED Discharge Orders    None       Daleen Bo, MD 11/14/17 1650    Daleen Bo, MD 11/23/17 1222

## 2017-11-14 NOTE — Progress Notes (Signed)
Pharmacy Note:  Initial antibiotic(s) regimen of Vancomycin ordered by EDP to treat pneumonia.  CrCl cannot be calculated (Unknown ideal weight.).   No Known Allergies  Vitals:   11/14/17 1630 11/14/17 1700  BP: 100/81 116/68  Pulse: (!) 55   Resp: 17 (!) 28  Temp:    SpO2: (!) 75%     Anti-infectives (From admission, onward)   Start     Dose/Rate Route Frequency Ordered Stop   11/14/17 1800  vancomycin (VANCOCIN) IVPB 1000 mg/200 mL premix     1,000 mg 200 mL/hr over 60 Minutes Intravenous  Once 11/14/17 1755     11/14/17 1745  ceFEPIme (MAXIPIME) 1 g in dextrose 5 % 50 mL IVPB     1 g 100 mL/hr over 30 Minutes Intravenous Every 8 hours 11/14/17 1732 11/22/17 1359   11/14/17 1545  cefTRIAXone (ROCEPHIN) 1 g in dextrose 5 % 50 mL IVPB  Status:  Discontinued     1 g 100 mL/hr over 30 Minutes Intravenous  Once 11/14/17 1534 11/14/17 1732   11/14/17 1545  azithromycin (ZITHROMAX) 500 mg in dextrose 5 % 250 mL IVPB     500 mg 250 mL/hr over 60 Minutes Intravenous  Once 11/14/17 1534 11/14/17 1718     Plan: Initial dose(s) of Vancomycin 1000mg  X 1 ordered. F/U admission orders for further dosing if therapy continued.  Ena Dawley, [facility] 11/14/2017 5:56 PM

## 2017-11-14 NOTE — H&P (Signed)
History and Physical    RITISHA Dean JJO:[id number] DOB: 07-06-1941 DOA: 11/14/2017  PCP: Madelyn Brunner, MD  Patient coming from: SNF  Chief Complaint: Abnormal labs  HPI: Shari Dean is a 77 y.o. female with medical history significant of Advanced dementia, chronic bedbound state with chronic wounds, recent infected ulcer to her foot treated with Vanco and Zosyn via PICC line.  This PICC line was removed about a week ago.  Yesterday she was diagnosed with pneumonia again in the nursing home and another PICC line was placed today.  She was started on Rocephin and Levaquin.  She had abnormal labs that were drawn yesterday and was therefore sent to the emergency department for renal failure and the need for IV fluids.  She is found to have pneumonia on infiltrate.  Patient cannot provide any history she is nonverbal.  No family is present.  She has been given IV Rocephin and azithromycin in the ED for coverage for community-acquired pneumonia.  Patient is referred for admission for acute kidney injury with pneumonia and hypernatremia and dehydration.  Review of Systems: Unobtainable from family due to her dementia  Past Medical History:  Diagnosis Date  . Chronic cystitis   . Chronic cystitis with hematuria   . Chronic pain   . Dementia   . Depression   . Dysphagia   . Epigastric pain   . Hx MRSA infection   . Hyperlipidemia   . Hyperlipidemia   . Impaired cognition   . Incontinence   . 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 Norovirus   . Osteoarthritis   . Osteoarthritis   . Osteopenia   . Pressure ulcer of unspecified site, stage 4 ([place])   . Renal disorder    kidney failure    History reviewed. No pertinent surgical history.  Known due to her dementia   reports that she has quit smoking. she has never used smokeless tobacco. She reports that she does not drink alcohol or use drugs.  No Known Allergies  Family History    Problem Relation Age of Onset  . Diabetes type II Unknown   . Multiple sclerosis Unknown   . Dementia Unknown     Prior to Admission medications   Medication Sig Start Date End Date Taking? Authorizing Provider  acetaminophen (TYLENOL) 325 MG tablet Take 650 mg by mouth every 6 (six) hours as needed. For pain   Yes [provider]  Amino Acids-Protein Hydrolys (FEEDING SUPPLEMENT, PRO-STAT SUGAR FREE 64,) LIQD Take 30 mLs by mouth 2 (two) times daily.   Yes [provider]  cefTRIAXone (ROCEPHIN) 20 MG/ML IVPB Inject 1 g into the vein daily. 11/13/17  Yes [provider]  ELIQUIS 5 MG TABS tablet Take 1 tablet by mouth 2 (two) times daily. 10/26/17  Yes [provider]  ipratropium-albuterol (DUONEB) 0.5-2.5 (3) MG/3ML SOLN Inhale 3 mLs into the lungs 4 (four) times daily. 11/13/17  Yes [provider]  levofloxacin (LEVAQUIN) 500 MG/100ML SOLN Inject 500 mg into the vein once. 11/13/17  Yes [provider]  Nutritional Supplements (NUTRITIONAL DRINK PO) Take 4 oz by mouth 2 (two) times daily.   Yes [provider]  promethazine (PHENERGAN) 25 MG/ML injection Inject 25 mg into the vein every 6 (six) hours as needed for nausea or vomiting.   Yes [provider]  senna (SENOKOT) 8.6 MG TABS Take 1 tablet by mouth at bedtime.  Yes [provider]  sertraline (ZOLOFT) 25 MG tablet Take 25 mg by mouth daily.   Yes [provider]  sodium chloride 0.45 % solution Inject 1,000 mLs into the vein daily. Use 1000 ml intravenously one time a day for PNU rate of 86ml/hr for total of 2 liter. 11/13/17  Yes [provider]    Physical Exam: Vitals:   11/14/17 1530 11/14/17 1600 11/14/17 1630 11/14/17 1700  BP: 115/80 (!) 111/97 100/81 116/68  Pulse: (!) 118 79 (!) 55   Resp: (!) 25 (!) 22 17 (!) 28  Temp:      TempSrc:      SpO2: 96% (!) 84% (!) 75%   Weight:          Constitutional: Contracted and  demented very dehydrated Vitals:   11/14/17 1530 11/14/17 1600 11/14/17 1630 11/14/17 1700  BP: 115/80 (!) 111/97 100/81 116/68  Pulse: (!) 118 79 (!) 55   Resp: (!) 25 (!) 22 17 (!) 28  Temp:      TempSrc:      SpO2: 96% (!) 84% (!) 75%   Weight:       Eyes: PERRL, lids and conjunctivae normal ENMT: Mucous membranes are dry. Posterior pharynx clear of any exudate or lesions.Normal dentition.  Neck: normal, supple, no masses, no thyromegaly Respiratory: clear to auscultation bilaterally, no wheezing, no crackles. Normal respiratory effort. No accessory muscle use.  Cardiovascular: Regular rate and rhythm, no murmurs / rubs / gallops. No extremity edema. 2+ pedal pulses. No carotid bruits.  Abdomen: no tenderness, no masses palpated. No hepatosplenomegaly. Bowel sounds positive.  Musculoskeletal: no clubbing / cyanosis. No joint deformity upper and lower extremities. Good ROM, no contractures. Normal muscle tone.  Skin: no rashes, lesions, ulcers. No induration decubitus wounds Neurologic: Cannot participate in any conversation or simple commands contracted extremities  psychiatric: Advanced dementia not agitated  Labs on Admission: I have personally reviewed following labs and imaging studies  CBC: Recent Labs  Lab 11/14/17 1334  WBC 16.1*  NEUTROABS 14.0*  HGB 10.6*  HCT 36.5  MCV 87.7  PLT 062   Basic Metabolic Panel: Recent Labs  Lab 11/14/17 1334  NA 169*  K 3.2*  CL 129*  CO2 20*  GLUCOSE 154*  BUN 113*  CREATININE 3.94*  CALCIUM 8.0*   GFR: CrCl cannot be calculated (Unknown ideal weight.). Liver Function Tests: Recent Labs  Lab 11/14/17 1334  AST 30  ALT 43  ALKPHOS 77  BILITOT 0.4  PROT 7.3  ALBUMIN 2.6*   No results for input(s): LIPASE, AMYLASE in the last 168 hours. No results for input(s): AMMONIA in the last 168 hours. Coagulation Profile: No results for input(s): INR, PROTIME in the last 168 hours. Cardiac Enzymes: No results for  input(s): CKTOTAL, CKMB, CKMBINDEX, TROPONINI in the last 168 hours. BNP (last 3 results) No results for input(s): PROBNP in the last 8760 hours. HbA1C: No results for input(s): HGBA1C in the last 72 hours. CBG: No results for input(s): GLUCAP in the last 168 hours. Lipid Profile: No results for input(s): CHOL, HDL, LDLCALC, TRIG, CHOLHDL, LDLDIRECT in the last 72 hours. Thyroid Function Tests: No results for input(s): TSH, T4TOTAL, FREET4, T3FREE, THYROIDAB in the last 72 hours. Anemia Panel: No results for input(s): VITAMINB12, FOLATE, FERRITIN, TIBC, IRON, RETICCTPCT in the last 72 hours. Urine analysis:    Component Value Date/Time   COLORURINE AMBER (A) 11/14/2017 1305   APPEARANCEUR HAZY (A) 11/14/2017 1305   LABSPEC 1.020  11/14/2017 1305   PHURINE 5.0 11/14/2017 1305   GLUCOSEU NEGATIVE 11/14/2017 1305   HGBUR LARGE (A) 11/14/2017 1305   BILIRUBINUR NEGATIVE 11/14/2017 1305   KETONESUR NEGATIVE 11/14/2017 1305   PROTEINUR NEGATIVE 11/14/2017 1305   UROBILINOGEN 1.0 07/18/2012 1714   NITRITE NEGATIVE 11/14/2017 1305   LEUKOCYTESUR NEGATIVE 11/14/2017 1305   Sepsis Labs: !!!!!!!!!!!!!!!!!!!!!!!!!!!!!!!!!!!!!!!!!!!! @LABRCNTIP (procalcitonin:4,lacticidven:4) )No results found for this or any previous visit (from the past 240 hour(s)).   Radiological Exams on Admission: Dg Chest Port 1 View  Result Date: 11/14/2017 CLINICAL DATA:  Altered mental status. Worsening level of consciousness. EXAM: PORTABLE CHEST 1 VIEW COMPARISON:  11/08/2016.  Chest CT 05/04/2017 FINDINGS: Heart size is normal. Mediastinal shadows are normal. Right arm PICC tip in the SVC 2 cm above the right atrium. Patchy bilateral bronchopneumonia, most pronounced in the left lower lobe. No dense consolidation or lobar collapse. No effusions. Small segment of embolized wire in the left lower lobe pulmonary arterial tree is unchanged from previous studies as distant as 11/08/2016. IMPRESSION: Patchy bilateral  bronchopneumonia, most pronounced in the left lower lobe. Electronically Signed   By: Nelson Chimes M.D.   On: 11/14/2017 13:46    Old chart reviewed Case discussed with Dr. Eulis Foster in the ED Chest x-ray reviewed left lower lobe pneumonia  Assessment/Plan 77 year old female with advanced dementia comes in with H CAP Principal Problem:   HCAP (healthcare-associated pneumonia)-due to patient's recent antibiotic usage along with nursing home status will broaden her antibiotics to Vanco and cefepime.  Obtain blood and sputum cultures.  Active Problems:   Severe dehydration-given 30 cc/kg bolus in the ED.  We will repeat a BMP this evening and provide more IV fluids overnight   Dementia-advanced   Hypernatremia-due to dehydration IV fluids   AKI (acute kidney injury) (HCC)-due to dehydration IV fluids   History of pulmonary embolus (PE)-noted   Dr. Eulis Foster in the ED did bring up patient's CODE STATUS with family members earlier when they were here and they reported she was full code.  Family might benefit from some goals of care discussion during this hospitalization due to patient's advanced dementia.   DVT prophylaxis: SCDs Code Status: Presumptive full code Family Communication: None Disposition Plan: Per day team Consults called: None Admission status: Admission   Kenadee Gates A MD [facility]  If 7PM-7AM, please contact night-coverage [email or web address] Password [facility]  11/14/2017, 5:32 PM

## 2017-11-14 NOTE — Progress Notes (Signed)
CRITICAL VALUE ALERT  Critical Value:  167 sodium  Date & Time Notied:  11/14/2017 2030  Provider Notified: [place], yes  Orders Received/Actions taken: Dr ordered 1,000 bolus normal saline which I administered to patient.

## 2017-11-15 ENCOUNTER — Inpatient Hospital Stay (HOSPITAL_COMMUNITY)

## 2017-11-15 ENCOUNTER — Encounter (HOSPITAL_COMMUNITY): Payer: Self-pay

## 2017-11-15 ENCOUNTER — Other Ambulatory Visit: Payer: Self-pay

## 2017-11-15 DIAGNOSIS — J189 Pneumonia, unspecified organism: Secondary | ICD-10-CM

## 2017-11-15 LAB — URINE CULTURE: CULTURE: NO GROWTH

## 2017-11-15 LAB — CBC WITH DIFFERENTIAL/PLATELET
BASOS ABS: 0 10*3/uL (ref 0.0–0.1)
BASOS PCT: 0 %
EOS ABS: 0 10*3/uL (ref 0.0–0.7)
EOS PCT: 0 %
HCT: 29.7 % — ABNORMAL LOW (ref 36.0–46.0)
Hemoglobin: 8.6 g/dL — ABNORMAL LOW (ref 12.0–15.0)
Lymphocytes Relative: 7 %
Lymphs Abs: 0.8 10*3/uL (ref 0.7–4.0)
MCH: 25.2 pg — AB (ref 26.0–34.0)
MCHC: 29 g/dL — ABNORMAL LOW (ref 30.0–36.0)
MCV: 87.1 fL (ref 78.0–100.0)
Monocytes Absolute: 0.5 10*3/uL (ref 0.1–1.0)
Monocytes Relative: 4 %
Neutro Abs: 11 10*3/uL — ABNORMAL HIGH (ref 1.7–7.7)
Neutrophils Relative %: 89 %
PLATELETS: 195 10*3/uL (ref 150–400)
RBC: 3.41 MIL/uL — AB (ref 3.87–5.11)
RDW: 19.3 % — ABNORMAL HIGH (ref 11.5–15.5)
WBC: 12.4 10*3/uL — ABNORMAL HIGH (ref 4.0–10.5)

## 2017-11-15 LAB — BASIC METABOLIC PANEL
BUN: 105 mg/dL — AB (ref 6–20)
CALCIUM: 7.5 mg/dL — AB (ref 8.9–10.3)
CO2: 15 mmol/L — ABNORMAL LOW (ref 22–32)
CREATININE: 3.31 mg/dL — AB (ref 0.44–1.00)
Chloride: >130 mmol/L (ref 101–111)
GFR calc Af Amer: 15 mL/min — ABNORMAL LOW (ref >60)
GFR, EST NON AFRICAN AMERICAN: 13 mL/min — AB (ref >60)
Glucose, Bld: 102 mg/dL — ABNORMAL HIGH (ref 65–99)
Potassium: 3.2 mmol/L — ABNORMAL LOW (ref 3.5–5.1)
Sodium: 165 mmol/L (ref 135–145)

## 2017-11-15 LAB — STREP PNEUMONIAE URINARY ANTIGEN: STREP PNEUMO URINARY ANTIGEN: NEGATIVE

## 2017-11-15 LAB — MRSA PCR SCREENING: MRSA BY PCR: POSITIVE — AB

## 2017-11-15 MED ORDER — DEXTROSE 5 % IV SOLN
1.0000 g | INTRAVENOUS | Status: DC
  Administered 2017-11-15 – 2017-11-17 (×3): 1 g via INTRAVENOUS
  Filled 2017-11-15 (×4): qty 1

## 2017-11-15 MED ORDER — VANCOMYCIN HCL 500 MG IV SOLR
500.0000 mg | INTRAVENOUS | Status: DC
  Administered 2017-11-15 – 2017-11-17 (×3): 500 mg via INTRAVENOUS
  Filled 2017-11-15 (×3): qty 500

## 2017-11-15 MED ORDER — APIXABAN 2.5 MG PO TABS
2.5000 mg | ORAL_TABLET | Freq: Two times a day (BID) | ORAL | Status: DC
  Administered 2017-11-15 – 2017-11-18 (×6): 2.5 mg via ORAL
  Filled 2017-11-15 (×6): qty 1

## 2017-11-15 MED ORDER — DEXTROSE 5 % IV SOLN
INTRAVENOUS | Status: DC
  Administered 2017-11-15 – 2017-11-17 (×5): via INTRAVENOUS

## 2017-11-15 NOTE — Progress Notes (Signed)
PROGRESS NOTE    Shari Dean  YBO:[id number] DOB: [birth year] DOA: 11/14/2017 PCP: Madelyn Brunner, MD     Brief Narrative:  77 year old woman admitted from SNF on 126 due to abnormal labs when she was found to have an elevated creatinine.  She has severe dementia and is nonverbal at baseline.  She has been admitted once before for pneumonia and dehydration.  She is found to have pneumonia on chest x-ray as well as evidence of severe dehydrate ischemia.  Admission requested.   Assessment & Plan:   Principal Problem:   HCAP (healthcare-associated pneumonia) Active Problems:   Dementia   Hypernatremia   AKI (acute kidney injury) ([place])   History of pulmonary embolus (PE)   Severe dehydration   Hospital-acquired pneumonia -MRSA PCR is positive hence will continue treatment with vancomycin and cefepime for now. -Culture data remains negative to date.  Acute renal failure -Likely due to prerenal azotemia and severe dehydration. -Follow with IV fluids. -Renal ultrasound has been requested to rule out hydronephrosis or obstruction.  Hypernatremia/hyperchloremia -Secondary to severe dehydration in an elderly person with dementia who is not able to make her thirst needs apparent. -Switch IV fluids from normal saline to D5W at 75 cc an hour and recheck electrolytes in a.m.    History of PE -On Eliquis  Advanced dementia -Per family member she is currently at baseline which is nonverbal, contracted.  Adult failure to thrive -We will request palliative care consultation in this elderly lady with pneumonia and severe dehydration to clarify goals of care as this will only become a vicious cycle.  Pressure ulcer of the sacrum, stage IV -Have requested wound care for further recommendations. -Present prior to admission.       DVT prophylaxis: On Eliquis Code Status: Remains full code at present Family Communication: Husband, son, daughter at bedside updated on plan  of care and questions answered Disposition Plan: Pending medical stability and results of palliative care consultation  Consultants:   Palliative care, pending  Procedures:   None  Antimicrobials:  Anti-infectives (From admission, onward)   Start     Dose/Rate Route Frequency Ordered Stop   11/15/17 1800  vancomycin (VANCOCIN) 500 mg in sodium chloride 0.9 % 100 mL IVPB     500 mg 100 mL/hr over 60 Minutes Intravenous Every 24 hours 11/15/17 0853     11/15/17 1330  ceFEPIme (MAXIPIME) 1 g in dextrose 5 % 50 mL IVPB     1 g 100 mL/hr over 30 Minutes Intravenous Every 24 hours 11/15/17 1125     11/14/17 1800  ceFEPIme (MAXIPIME) 1 g in dextrose 5 % 50 mL IVPB     1 g 100 mL/hr over 30 Minutes Intravenous  Once 11/14/17 1732 11/15/17 0200   11/14/17 1800  vancomycin (VANCOCIN) IVPB 1000 mg/200 mL premix     1,000 mg 200 mL/hr over 60 Minutes Intravenous  Once 11/14/17 1755 11/15/17 0112   11/14/17 1545  cefTRIAXone (ROCEPHIN) 1 g in dextrose 5 % 50 mL IVPB  Status:  Discontinued     1 g 100 mL/hr over 30 Minutes Intravenous  Once 11/14/17 1534 11/14/17 1732   11/14/17 1545  azithromycin (ZITHROMAX) 500 mg in dextrose 5 % 250 mL IVPB     500 mg 250 mL/hr over 60 Minutes Intravenous  Once 11/14/17 1534 11/14/17 1718       Subjective: Lying in bed, opens eyes to voice, contracted in fetal position  Objective: Vitals:  11/15/17 0304 11/15/17 0311 11/15/17 0505 11/15/17 0842  BP: (!) 101/50  (!) 113/53   Pulse: (!) 131 96    Resp:  (!) 24 (!) 22   Temp: (!) 97.5 F (36.4 C)  98.4 F (36.9 C)   TempSrc: Axillary  Axillary   SpO2:  96% 98% 95%  Weight: 49.8 kg (109 lb 12.6 oz)       Intake/Output Summary (Last 24 hours) at 11/15/2017 1638 Last data filed at 11/15/2017 0400 Gross per 24 hour  Intake 1170.83 ml  Output -  Net 1170.83 ml   Filed Weights   11/14/17 1231 11/14/17 2010 11/15/17 0304  Weight: 49.9 kg (110 lb 0.2 oz) 49.8 kg (109 lb 12.6 oz) 49.8 kg (109 lb  12.6 oz)    Examination:  General exam: Awake, unable to assess orientation given nonverbal state, appears chronically ill and cachectic, is contracted Respiratory system: Clear to auscultation. Respiratory effort normal. Cardiovascular system:RRR. No murmurs, rubs, gallops. Gastrointestinal system: Abdomen is nondistended, soft and nontender. No organomegaly or masses felt. Normal bowel sounds heard. Central nervous system: Unable to assess given current mental state Extremities: No C/C/E, +pedal pulses Skin: Sacral decubitus ulcer, likely stage IV Psychiatry: Unable to assess given current mental state    Data Reviewed: I have personally reviewed following labs and imaging studies  CBC: Recent Labs  Lab 11/14/17 1334 11/15/17 0615  WBC 16.1* 12.4*  NEUTROABS 14.0* 11.0*  HGB 10.6* 8.6*  HCT 36.5 29.7*  MCV 87.7 87.1  PLT 275 892   Basic Metabolic Panel: Recent Labs  Lab 11/14/17 1334 11/14/17 2000 11/15/17 0615  NA 169* 167* 165*  K 3.2* 2.9* 3.2*  CL 129* 129* >130*  CO2 20* 20* 15*  GLUCOSE 154* 135* 102*  BUN 113* 115* 105*  CREATININE 3.94* 3.92* 3.31*  CALCIUM 8.0* 7.8* 7.5*   GFR: CrCl cannot be calculated (Unknown ideal weight.). Liver Function Tests: Recent Labs  Lab 11/14/17 1334  AST 30  ALT 43  ALKPHOS 77  BILITOT 0.4  PROT 7.3  ALBUMIN 2.6*   No results for input(s): LIPASE, AMYLASE in the last 168 hours. No results for input(s): AMMONIA in the last 168 hours. Coagulation Profile: No results for input(s): INR, PROTIME in the last 168 hours. Cardiac Enzymes: No results for input(s): CKTOTAL, CKMB, CKMBINDEX, TROPONINI in the last 168 hours. BNP (last 3 results) No results for input(s): PROBNP in the last 8760 hours. HbA1C: No results for input(s): HGBA1C in the last 72 hours. CBG: No results for input(s): GLUCAP in the last 168 hours. Lipid Profile: No results for input(s): CHOL, HDL, LDLCALC, TRIG, CHOLHDL, LDLDIRECT in the last 72  hours. Thyroid Function Tests: No results for input(s): TSH, T4TOTAL, FREET4, T3FREE, THYROIDAB in the last 72 hours. Anemia Panel: No results for input(s): VITAMINB12, FOLATE, FERRITIN, TIBC, IRON, RETICCTPCT in the last 72 hours. Urine analysis:    Component Value Date/Time   COLORURINE AMBER (A) 11/14/2017 1305   APPEARANCEUR HAZY (A) 11/14/2017 1305   LABSPEC 1.020 11/14/2017 1305   PHURINE 5.0 11/14/2017 1305   GLUCOSEU NEGATIVE 11/14/2017 1305   HGBUR LARGE (A) 11/14/2017 1305   BILIRUBINUR NEGATIVE 11/14/2017 1305   KETONESUR NEGATIVE 11/14/2017 1305   PROTEINUR NEGATIVE 11/14/2017 1305   UROBILINOGEN 1.0 07/18/2012 1714   NITRITE NEGATIVE 11/14/2017 1305   LEUKOCYTESUR NEGATIVE 11/14/2017 1305   Sepsis Labs: @LABRCNTIP (procalcitonin:4,lacticidven:4)  ) Recent Results (from the past 240 hour(s))  Urine culture     Status: None  Collection Time: 11/14/17  1:05 PM  Result Value Ref Range Status   Specimen Description URINE, CATHETERIZED  Final   Special Requests NONE  Final   Culture   Final    NO GROWTH Performed at [facility], [address] [place], [place] [address]    Report Status 11/15/2017 FINAL  Final  Blood Culture (routine x 2)     Status: None (Preliminary result)   Collection Time: 11/14/17  1:35 PM  Result Value Ref Range Status   Specimen Description BLOOD  Final   Special Requests NONE  Final   Culture NO GROWTH < 24 HOURS  Final   Report Status PENDING  Incomplete  Blood Culture (routine x 2)     Status: None (Preliminary result)   Collection Time: 11/14/17  1:35 PM  Result Value Ref Range Status   Specimen Description BLOOD  Final   Special Requests NONE  Final   Culture NO GROWTH < 24 HOURS  Final   Report Status PENDING  Incomplete  MRSA PCR Screening     Status: Abnormal   Collection Time: 11/15/17 11:22 AM  Result Value Ref Range Status   MRSA by PCR POSITIVE (A) NEGATIVE Final    Comment:        The GeneXpert MRSA Assay  (FDA approved for NASAL specimens only), is one component of a comprehensive MRSA colonization surveillance program. It is not intended to diagnose MRSA infection nor to guide or monitor treatment for MRSA infections. RESULT CALLED TO, READ BACK BY AND VERIFIED WITH: JACKSON @ [phone] BY HENDERSON L          Radiology Studies: Dg Chest Port 1 View  Result Date: 11/14/2017 CLINICAL DATA:  Altered mental status. Worsening level of consciousness. EXAM: PORTABLE CHEST 1 VIEW COMPARISON:  11/08/2016.  Chest CT 05/04/2017 FINDINGS: Heart size is normal. Mediastinal shadows are normal. Right arm PICC tip in the SVC 2 cm above the right atrium. Patchy bilateral bronchopneumonia, most pronounced in the left lower lobe. No dense consolidation or lobar collapse. No effusions. Small segment of embolized wire in the left lower lobe pulmonary arterial tree is unchanged from previous studies as distant as 11/08/2016. IMPRESSION: Patchy bilateral bronchopneumonia, most pronounced in the left lower lobe. Electronically Signed   By: Nelson Chimes M.D.   On: 11/14/2017 13:46        Scheduled Meds: . apixaban  2.5 mg Oral BID  . feeding supplement (PRO-STAT SUGAR FREE 64)  30 mL Oral BID  . ipratropium-albuterol  3 mL Inhalation QID  . senna  1 tablet Oral QHS  . sertraline  25 mg Oral Daily   Continuous Infusions: . ceFEPime (MAXIPIME) IV Stopped (11/15/17 1443)  . dextrose 75 mL/hr at 11/15/17 1205  . vancomycin       LOS: 1 day    Time spent: 24 minutes. Greater than 50% of this time was spent in direct contact with the patient coordinating care.     Lelon Frohlich, MD [facility] Pager [phone]  If 7PM-7AM, please contact night-coverage [email or web address] Password [facility] 11/15/2017, 4:38 PM

## 2017-11-15 NOTE — Progress Notes (Signed)
Critical sodium 165, Cl >130.  Texted Dr. Jerilee Hoh.  Patient alert and this time.  Mumbles some. Swallowed two crushed pills in small amount of applesauce.

## 2017-11-15 NOTE — Progress Notes (Signed)
Positive for MRSA in nares.  Contacted Dr. Jerilee Hoh and initiated contact precautions.  Family has been at bedside.

## 2017-11-15 NOTE — Progress Notes (Signed)
Patient HR is still in 130's she is receiving bolus of fluids for dehydration, She is on 3 lpm oxygen saturation is not reliable at this time. Will hold neb for now due to heart rate. She has been on ventilator recently. Will continue to monitor for now.

## 2017-11-15 NOTE — Progress Notes (Signed)
Dressings changed to left hip ulcer.  Wet to dry covered with foam dressing. Sacral foam dressing placed to sacrum which covered right which covered stage 1.  New foam dressing placed to left foot ulcer and toes twined with gauze.  Mouth swabbed.

## 2017-11-15 NOTE — Progress Notes (Addendum)
Pharmacy Antibiotic Note  Shari Dean is a 77 y.o. female admitted on 11/14/2017 with pneumonia.  Pharmacy has been consulted for VANCOMYCIN and CEFEPIME dosing.  Plan: Vancomycin 1000mg  initially then 500mg  IV q24hrs Check trough at steady state Cefepime 1gm IV q24hrs (renally adjusted) Monitor labs, progress, cultures  Antimicrobials this admission: vancomycin 1/26 >>  Cefepime 1/27 >>  Dose adjustments this admission:  Microbiology results:  BCx: pending  UCx:  pending  Sputum:    MRSA PCR:   Weight: 109 lb 12.6 oz (49.8 kg)  Temp (24hrs), Avg:97.9 F (36.6 C), Min:97.5 F (36.4 C), Max:98.4 F (36.9 C)  Recent Labs  Lab 11/14/17 1334 11/14/17 1603 11/14/17 2000 11/15/17 0615  WBC 16.1*  --   --  12.4*  CREATININE 3.94*  --  3.92* 3.31*  LATICACIDVEN  --  1.19  --   --     CrCl cannot be calculated (Unknown ideal weight.).    No Known Allergies  Thank you for allowing pharmacy to be a part of this patient's care.  Hart Robinsons A 11/15/2017 8:54 AM

## 2017-11-16 ENCOUNTER — Encounter (HOSPITAL_COMMUNITY): Payer: Self-pay | Admitting: Primary Care

## 2017-11-16 DIAGNOSIS — N179 Acute kidney failure, unspecified: Secondary | ICD-10-CM

## 2017-11-16 NOTE — Consult Note (Signed)
Consultation Note Date: 11/16/2017   Patient Name: Shari Dean  DOB: 1941/08/27  MRN: [id number]  Age / Sex: 77 y.o., female  PCP: Madelyn Brunner, MD Referring Physician: Isaac Bliss, Olam Idler*  Reason for Consultation: Establishing goals of care, Hospice Evaluation and Psychosocial/spiritual support  HPI/Patient Profile: 77 y.o. female  with past medical history of advanced dementia, bedbound, nonverbal, SNF resident, admitted on 11/14/2017 with dehydration, healthcare acquired pneumonia.   Clinical Assessment and Goals of Care: Ms. Maultsby is lying quietly in bed. She does not open her eyes or try to interact with me in any meaningful way. Present today at bedside is her son/legal guardian, Lamar Laundry, son Ronalee Belts, and daughter Lawerance Bach.  We talk about Ms. Elwell health status. They share that she was in an ALF for 5 years, has been in skilled nursing facility for 17 months. They state that she has lost weight over the past 6 months. They also share that they take turns feeding her, she is eating less.  We talk about how to make choices for our loved ones including 1) keeping them at the center of decision-making, 2) are we doing something for them or to them, 3) what would the person she was 10 to 15 years ago say about where she is now. When I make this statement, son Ronalee Belts states that she would say let nature take its course.  We talk about the concept of treat the treatable and I've CPR, no intubation. Jimmye Norman states that at this point he would like for her to be kept alive until immediate family can arrive in the case of an emergency. See code status below. We also talk about the concept of let nature take its course, not illegal, not unethical, but some people have a moral problem with not doing everything. I encourage family that I will follow-up with him tomorrow.  Health care POA    LEGAL GUARDIAN - son Lamar Laundry, also has son Ronalee Belts, daughter Lawerance Bach. Legal spouse Francine Hannan.   SUMMARY OF RECOMMENDATIONS   considering choices. Considering partial code versus DNR. We talk about the concept of allowing natural death, also the concept of let nature take its course.  Code Status/Advance Care Planning:  Full code - son Gwyndolyn Saxon states that he is considered partial code, had also considered signing DNR, but has not. Son Ronalee Belts at bedside states that Mrs. Sheldon would want DNR. I ask Gwyndolyn Saxon if he would like for me to change code status today, he does not answer.  Symptom Management:   per hospitalist, no additional needs at this time.  Palliative Prophylaxis:   Frequent Pain Assessment, Palliative Wound Care and Turn Reposition  Additional Recommendations (Limitations, Scope, Preferences):  Full Scope Treatment and Family states they would not choose PEG tube  Psycho-social/Spiritual:   Desire for further Chaplaincy support:no  Additional Recommendations: Caregiving  Support/Resources and Education on Hospice  Prognosis:   < 6 months, would not be surprising based on functional status, advanced dementia, failure to thrive with poor  by mouth intake.  Discharge Planning: Likely return to residential SNF      Primary Diagnoses: Present on Admission: . HCAP (healthcare-associated pneumonia) . History of pulmonary embolus (PE) . Dementia . Severe dehydration . Hypernatremia . AKI (acute kidney injury) ([place])   I have reviewed the medical record, interviewed the patient and family, and examined the patient. The following aspects are pertinent.  Past Medical History:  Diagnosis Date  . Chronic cystitis   . Chronic cystitis with hematuria   . Chronic pain   . Dementia   . Depression   . Dysphagia   . Epigastric pain   . Hx MRSA infection   . Hyperlipidemia   . Hyperlipidemia   . Impaired cognition   . Incontinence   . 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 Norovirus   . Osteoarthritis   . Osteoarthritis   . Osteopenia   . Pressure ulcer of unspecified site, stage 4 ([place])   . Renal disorder    kidney failure   Social History   Socioeconomic History  . Marital status: Married    Spouse name: None  . Number of children: None  . Years of education: None  . Highest education level: None  Social Needs  . Financial resource strain: None  . Food insecurity - worry: None  . Food insecurity - inability: None  . Transportation needs - medical: None  . Transportation needs - non-medical: None  Occupational History  . None  Tobacco Use  . Smoking status: Former Research scientist (life sciences)  . Smokeless tobacco: Never Used  Substance and Sexual Activity  . Alcohol use: No  . Drug use: No  . Sexual activity: No  Other Topics Concern  . None  Social History Narrative  . None   Family History  Problem Relation Age of Onset  . Diabetes type II Unknown   . Multiple sclerosis Unknown   . Dementia Unknown    Scheduled Meds: . apixaban  2.5 mg Oral BID  . feeding supplement (PRO-STAT SUGAR FREE 64)  30 mL Oral BID  . ipratropium-albuterol  3 mL Inhalation QID  . senna  1 tablet Oral QHS  . sertraline  25 mg Oral Daily   Continuous Infusions: . ceFEPime (MAXIPIME) IV 1 g (11/16/17 1355)  . dextrose 75 mL/hr at 11/16/17 1356  . vancomycin 500 mg (11/15/17 1737)   PRN Meds:.promethazine Medications Prior to Admission:  Prior to Admission medications   Medication Sig Start Date End Date Taking? Authorizing Provider  acetaminophen (TYLENOL) 325 MG tablet Take 650 mg by mouth every 6 (six) hours as needed. For pain   Yes [provider]  Amino Acids-Protein Hydrolys (FEEDING SUPPLEMENT, PRO-STAT SUGAR FREE 64,) LIQD Take 30 mLs by mouth 2 (two) times daily.   Yes [provider]  cefTRIAXone (ROCEPHIN) 20 MG/ML IVPB Inject 1 g into the vein daily. 11/13/17  Yes [provider]  ELIQUIS 5 MG TABS tablet Take 1 tablet by mouth 2 (two) times daily. 10/26/17  Yes [provider]  ipratropium-albuterol (DUONEB) 0.5-2.5 (3) MG/3ML SOLN Inhale 3 mLs into the lungs 4 (four) times daily. 11/13/17  Yes [provider]  levofloxacin (LEVAQUIN) 500 MG/100ML SOLN Inject 500 mg into the vein once. 11/13/17  Yes [provider]  Nutritional Supplements (NUTRITIONAL DRINK PO) Take 4 oz by mouth 2 (two) times daily.   Yes [provider]  promethazine (PHENERGAN) 25 MG/ML injection Inject  25 mg into the vein every 6 (six) hours as needed for nausea or vomiting.   Yes [provider]  senna (SENOKOT) 8.6 MG TABS Take 1 tablet by mouth at bedtime.    Yes [provider]  sertraline (ZOLOFT) 25 MG tablet Take 25 mg by mouth daily.   Yes [provider]  sodium chloride 0.45 % solution Inject 1,000 mLs into the vein daily. Use 1000 ml intravenously one time a day for PNU rate of 57ml/hr for total of 2 liter. 11/13/17  Yes [provider]   No Known Allergies Review of Systems  Unable to perform ROS: Dementia    Physical Exam  Constitutional: No distress.  Appears acutely/chronically ill  HENT:  Head: Atraumatic.  Temporal wasting  Cardiovascular: Normal rate and regular rhythm.  Pulmonary/Chest: Effort normal. No respiratory distress.  Some work of breathing noted  Abdominal: Soft. She exhibits no distension.  Musculoskeletal:  Contracted at baseline  Neurological:  None dementia, nonverbal, does not open eyes  Skin: Skin is warm and dry.  Wounds as noted by nursing  Nursing note and vitals reviewed.   Vital Signs: BP 108/89 (BP Location: Left Leg)   Pulse 79   Temp 98.9 F (37.2 C) (Oral)   Resp 18   Ht 5\' 7"  (1.702 m)   Wt 49.8 kg (109 lb 12.6 oz)   SpO2 93%   BMI 17.20 kg/m  Pain Assessment: No/denies pain       SpO2: SpO2: 93 % O2 Device:SpO2: 93 % O2 Flow Rate: .O2 Flow  Rate (L/min): 10 L/min(Will decrease back down due to this not being a high flow ; not use above 6L)  IO: Intake/output summary:   Intake/Output Summary (Last 24 hours) at 11/16/2017 1502 Last data filed at 11/16/2017 8938 Gross per 24 hour  Intake 1308.75 ml  Output 500 ml  Net 808.75 ml    LBM:   Baseline Weight: Weight: 49.9 kg (110 lb 0.2 oz) Most recent weight: Weight: 49.8 kg (109 lb 12.6 oz)     Palliative Assessment/Data:   Flowsheet Rows     Most Recent Value  Intake Tab  Referral Department  Hospitalist  Unit at Time of Referral  Med/Surg Unit  Date Notified  11/15/17  Palliative Care Type  New Palliative care  Reason for referral  Clarify Goals of Care, Counsel Regarding Hospice  Date of Admission  11/14/17  Date first seen by Palliative Care  11/16/17  # of days Palliative referral response time  1 Day(s)  # of days IP prior to Palliative referral  1  Clinical Assessment  Palliative Performance Scale Score  30%  Pain Max last 24 hours  Not able to report  Pain Min Last 24 hours  Not able to report  Dyspnea Max Last 24 Hours  Not able to report  Dyspnea Min Last 24 hours  Not able to report  Psychosocial & Spiritual Assessment  Palliative Care Outcomes  Patient/Family meeting held?  Yes      Time In:  1530 Time Out: 1620 Time Total: 50 minutes  Greater than 50%  of this time was spent counseling and coordinating care related to the above assessment and plan.  Signed by: Drue Novel, NP   Please contact Palliative Medicine Team phone at [phone] for questions and concerns.  For individual provider: See Shea Evans

## 2017-11-16 NOTE — Progress Notes (Signed)
PROGRESS NOTE    Shari Dean  CNO:[id number] DOB: [birth year] DOA: 11/14/2017 PCP: Madelyn Brunner, MD     Brief Narrative:  77 year old woman admitted from SNF on 126 due to abnormal labs when she was found to have an elevated creatinine.  She has severe dementia and is nonverbal at baseline.  She has been admitted once before for pneumonia and dehydration.  She is found to have pneumonia on chest x-ray as well as evidence of severe dehydration.  Admission requested.   Assessment & Plan:   Principal Problem:   HCAP (healthcare-associated pneumonia) Active Problems:   Dementia   Hypernatremia   AKI (acute kidney injury) ([place])   History of pulmonary embolus (PE)   Severe dehydration   Hospital-acquired pneumonia -MRSA PCR is positive hence will continue treatment with vancomycin and cefepime for now. -Culture data remains negative to date.  Acute renal failure -Likely due to prerenal azotemia and severe dehydration. -Follow with IV fluids. -Renal ultrasound negative for hydronephrosis or obstruction.  Hypernatremia/hyperchloremia -Secondary to severe dehydration in an elderly person with dementia who is not able to make her thirst needs apparent. -Continue D5W. Recheck labs in am.   History of PE -On Eliquis  Advanced dementia -Per family member she is currently at baseline which is nonverbal, contracted.  Adult failure to thrive -We will request palliative care consultation in this elderly lady with pneumonia and severe dehydration to clarify goals of care as this will only become a vicious cycle.  Pressure ulcer of the sacrum, stage IV -Have requested wound care for further recommendations. -Present prior to admission.       DVT prophylaxis: On Eliquis Code Status: Remains full code at present Family Communication: Husband, son, daughter at bedside on 1/27 updated on plan of care and questions answered Disposition Plan: Pending medical stability  and results of palliative care consultation  Consultants:   Palliative care  Procedures:   None  Antimicrobials:  Anti-infectives (From admission, onward)   Start     Dose/Rate Route Frequency Ordered Stop   11/15/17 1800  vancomycin (VANCOCIN) 500 mg in sodium chloride 0.9 % 100 mL IVPB     500 mg 100 mL/hr over 60 Minutes Intravenous Every 24 hours 11/15/17 0853     11/15/17 1330  ceFEPIme (MAXIPIME) 1 g in dextrose 5 % 50 mL IVPB     1 g 100 mL/hr over 30 Minutes Intravenous Every 24 hours 11/15/17 1125     11/14/17 1800  ceFEPIme (MAXIPIME) 1 g in dextrose 5 % 50 mL IVPB     1 g 100 mL/hr over 30 Minutes Intravenous  Once 11/14/17 1732 11/15/17 0200   11/14/17 1800  vancomycin (VANCOCIN) IVPB 1000 mg/200 mL premix     1,000 mg 200 mL/hr over 60 Minutes Intravenous  Once 11/14/17 1755 11/15/17 0112   11/14/17 1545  cefTRIAXone (ROCEPHIN) 1 g in dextrose 5 % 50 mL IVPB  Status:  Discontinued     1 g 100 mL/hr over 30 Minutes Intravenous  Once 11/14/17 1534 11/14/17 1732   11/14/17 1545  azithromycin (ZITHROMAX) 500 mg in dextrose 5 % 250 mL IVPB     500 mg 250 mL/hr over 60 Minutes Intravenous  Once 11/14/17 1534 11/14/17 1718       Subjective: Lying in bed, opens eyes to voice.  Objective: Vitals:   11/16/17 0604 11/16/17 0757 11/16/17 1146 11/16/17 1500  BP: 108/89   97/68  Pulse: 79   (!) 114  Resp: 18   18  Temp: 98.9 F (37.2 C)   99.5 F (37.5 C)  TempSrc: Oral   Oral  SpO2: 96% 98% 93% 100%  Weight:      Height:        Intake/Output Summary (Last 24 hours) at 11/16/2017 2048 Last data filed at 11/16/2017 1956 Gross per 24 hour  Intake 1470 ml  Output 900 ml  Net 570 ml   Filed Weights   11/14/17 1231 11/14/17 2010 11/15/17 0304  Weight: 49.9 kg (110 lb 0.2 oz) 49.8 kg (109 lb 12.6 oz) 49.8 kg (109 lb 12.6 oz)    Examination:  General exam: Awake, unable to assess orientation given nonverbal state, appears chronically ill and cachectic, is  contracted Respiratory system: Clear to auscultation. Respiratory effort normal. Cardiovascular system:RRR. No murmurs, rubs, gallops. Gastrointestinal system: Abdomen is nondistended, soft and nontender. No organomegaly or masses felt. Normal bowel sounds heard. Central nervous system: Unable to assess given current mental state Extremities: No C/C/E, +pedal pulses Skin: Sacral decubitus ulcer, likely stage IV Psychiatry: Unable to assess given current mental state    Data Reviewed: I have personally reviewed following labs and imaging studies  CBC: Recent Labs  Lab 11/14/17 1334 11/15/17 0615  WBC 16.1* 12.4*  NEUTROABS 14.0* 11.0*  HGB 10.6* 8.6*  HCT 36.5 29.7*  MCV 87.7 87.1  PLT 275 196   Basic Metabolic Panel: Recent Labs  Lab 11/14/17 1334 11/14/17 2000 11/15/17 0615  NA 169* 167* 165*  K 3.2* 2.9* 3.2*  CL 129* 129* >130*  CO2 20* 20* 15*  GLUCOSE 154* 135* 102*  BUN 113* 115* 105*  CREATININE 3.94* 3.92* 3.31*  CALCIUM 8.0* 7.8* 7.5*   GFR: Estimated Creatinine Clearance: 11.4 mL/min (A) (by C-G formula based on SCr of 3.31 mg/dL (H)). Liver Function Tests: Recent Labs  Lab 11/14/17 1334  AST 30  ALT 43  ALKPHOS 77  BILITOT 0.4  PROT 7.3  ALBUMIN 2.6*   No results for input(s): LIPASE, AMYLASE in the last 168 hours. No results for input(s): AMMONIA in the last 168 hours. Coagulation Profile: No results for input(s): INR, PROTIME in the last 168 hours. Cardiac Enzymes: No results for input(s): CKTOTAL, CKMB, CKMBINDEX, TROPONINI in the last 168 hours. BNP (last 3 results) No results for input(s): PROBNP in the last 8760 hours. HbA1C: No results for input(s): HGBA1C in the last 72 hours. CBG: No results for input(s): GLUCAP in the last 168 hours. Lipid Profile: No results for input(s): CHOL, HDL, LDLCALC, TRIG, CHOLHDL, LDLDIRECT in the last 72 hours. Thyroid Function Tests: No results for input(s): TSH, T4TOTAL, FREET4, T3FREE, THYROIDAB in  the last 72 hours. Anemia Panel: No results for input(s): VITAMINB12, FOLATE, FERRITIN, TIBC, IRON, RETICCTPCT in the last 72 hours. Urine analysis:    Component Value Date/Time   COLORURINE AMBER (A) 11/14/2017 1305   APPEARANCEUR HAZY (A) 11/14/2017 1305   LABSPEC 1.020 11/14/2017 1305   PHURINE 5.0 11/14/2017 1305   GLUCOSEU NEGATIVE 11/14/2017 1305   HGBUR LARGE (A) 11/14/2017 1305   BILIRUBINUR NEGATIVE 11/14/2017 1305   KETONESUR NEGATIVE 11/14/2017 1305   PROTEINUR NEGATIVE 11/14/2017 1305   UROBILINOGEN 1.0 07/18/2012 1714   NITRITE NEGATIVE 11/14/2017 1305   LEUKOCYTESUR NEGATIVE 11/14/2017 1305   Sepsis Labs: @LABRCNTIP (procalcitonin:4,lacticidven:4)  ) Recent Results (from the past 240 hour(s))  Urine culture     Status: None   Collection Time: 11/14/17  1:05 PM  Result Value Ref Range Status  Specimen Description URINE, CATHETERIZED  Final   Special Requests NONE  Final   Culture   Final    NO GROWTH Performed at [facility], [address] [place], [place] [address]    Report Status 11/15/2017 FINAL  Final  Blood Culture (routine x 2)     Status: None (Preliminary result)   Collection Time: 11/14/17  1:35 PM  Result Value Ref Range Status   Specimen Description BLOOD LEFT ARM  Final   Special Requests   Final    BOTTLES DRAWN AEROBIC AND ANAEROBIC Blood Culture adequate volume   Culture NO GROWTH 2 DAYS  Final   Report Status PENDING  Incomplete  Blood Culture (routine x 2)     Status: None (Preliminary result)   Collection Time: 11/14/17  1:35 PM  Result Value Ref Range Status   Specimen Description BLOOD LEFT HAND  Final   Special Requests   Final    BOTTLES DRAWN AEROBIC AND ANAEROBIC Blood Culture adequate volume   Culture NO GROWTH 2 DAYS  Final   Report Status PENDING  Incomplete  MRSA PCR Screening     Status: Abnormal   Collection Time: 11/15/17 11:22 AM  Result Value Ref Range Status   MRSA by PCR POSITIVE (A) NEGATIVE Final     Comment:        The GeneXpert MRSA Assay (FDA approved for NASAL specimens only), is one component of a comprehensive MRSA colonization surveillance program. It is not intended to diagnose MRSA infection nor to guide or monitor treatment for MRSA infections. RESULT CALLED TO, READ BACK BY AND VERIFIED WITH: JACKSON @ [phone] BY HENDERSON L          Radiology Studies: US Renal  Result Date: 11/15/2017 CLINICAL DATA:  Acute renal failure. EXAM: RENAL / URINARY TRACT ULTRASOUND COMPLETE COMPARISON:  None. FINDINGS: Right Kidney: Length: 8.8 cm. Echogenicity within normal limits. No mass or hydronephrosis visualized. Left Kidney: Length: 10.0 cm. Echogenicity within normal limits. No mass or hydronephrosis visualized. Bladder: Appears normal for degree of bladder distention. IMPRESSION: Normal appearance of both kidneys.  No evidence of hydronephrosis. Electronically Signed   By: Earle Gell M.D.   On: 11/15/2017 16:48        Scheduled Meds: . apixaban  2.5 mg Oral BID  . feeding supplement (PRO-STAT SUGAR FREE 64)  30 mL Oral BID  . ipratropium-albuterol  3 mL Inhalation QID  . senna  1 tablet Oral QHS  . sertraline  25 mg Oral Daily   Continuous Infusions: . ceFEPime (MAXIPIME) IV Stopped (11/16/17 1430)  . dextrose 75 mL/hr at 11/16/17 1356  . vancomycin 500 mg (11/16/17 1806)     LOS: 2 days    Time spent: 25 minutes. Greater than 50% of this time was spent in direct contact with the patient coordinating care.     Lelon Frohlich, MD [facility] Pager [phone]  If 7PM-7AM, please contact night-coverage [email or web address] Password [facility] 11/16/2017, 8:48 PM

## 2017-11-16 NOTE — Consult Note (Signed)
[place] Nurse wound consult note. Consultation completed using telehealth remote camera technology and the assistance of the bedside clinical staff.  Reason for Consult: sacral wound Patient is noted on nursing flow sheets to have other wounds, [place] Nurse to assess each Wound type: Sacrum: Deep Tissue Injury Left ischial: Stage 4 Pressure Injury Left medial foot: Stage 4 Pressure injury Pressure Injury POA: Yes  Measurement: see nursing flow sheets, verified today with my camera consultation and bedside nurse measurements  Wound bed: Sacrum: purple, non blanchable, intact skin Left ischium 90% pink, pale, non granular, 7%dark purple with skin changes consistent with wound extension along the distal wound edge, 2% yellow fibrin Left medial foot: 95% pink, moist/5% dark tissue an lateral edge of wound bed   Drainage (amount, consistency, odor)  Sacrum: serosanguineous, no odor per bedside  Left foot : serosanguineous, no odor    Periwound:intact  Dressing procedure/placement/frequency: Will add conservative topical treatment for the wounds, with palliative meeting planned soon. Add low air loss mattress for moisture management and pressure redistribution Patient has off loading boots on currently that are adequate Maximize nutrition if the goal is to heal the wounds   Discussed POC with patient and bedside nurse.  Re consult if needed, will not follow at this time. Thanks  Milka Windholz [employer], RN,CWOCN, CNS, [place] [phone])

## 2017-11-17 DIAGNOSIS — E860 Dehydration: Secondary | ICD-10-CM

## 2017-11-17 DIAGNOSIS — Z515 Encounter for palliative care: Secondary | ICD-10-CM

## 2017-11-17 DIAGNOSIS — Z7189 Other specified counseling: Secondary | ICD-10-CM

## 2017-11-17 DIAGNOSIS — F0390 Unspecified dementia without behavioral disturbance: Secondary | ICD-10-CM

## 2017-11-17 LAB — CBC
HCT: 29.4 % — ABNORMAL LOW (ref 36.0–46.0)
HEMOGLOBIN: 8.7 g/dL — AB (ref 12.0–15.0)
MCH: 25.7 pg — ABNORMAL LOW (ref 26.0–34.0)
MCHC: 29.6 g/dL — ABNORMAL LOW (ref 30.0–36.0)
MCV: 87 fL (ref 78.0–100.0)
Platelets: 174 10*3/uL (ref 150–400)
RBC: 3.38 MIL/uL — AB (ref 3.87–5.11)
RDW: 19.2 % — ABNORMAL HIGH (ref 11.5–15.5)
WBC: 11.2 10*3/uL — AB (ref 4.0–10.5)

## 2017-11-17 LAB — BASIC METABOLIC PANEL
ANION GAP: 12 (ref 5–15)
BUN: 71 mg/dL — ABNORMAL HIGH (ref 6–20)
CHLORIDE: 130 mmol/L — AB (ref 101–111)
CO2: 16 mmol/L — ABNORMAL LOW (ref 22–32)
Calcium: 8.3 mg/dL — ABNORMAL LOW (ref 8.9–10.3)
Creatinine, Ser: 2.48 mg/dL — ABNORMAL HIGH (ref 0.44–1.00)
GFR calc non Af Amer: 18 mL/min — ABNORMAL LOW (ref >60)
GFR, EST AFRICAN AMERICAN: 21 mL/min — AB (ref >60)
Glucose, Bld: 125 mg/dL — ABNORMAL HIGH (ref 65–99)
POTASSIUM: 2.6 mmol/L — AB (ref 3.5–5.1)
SODIUM: 158 mmol/L — AB (ref 135–145)

## 2017-11-17 LAB — MAGNESIUM: MAGNESIUM: 2.1 mg/dL (ref 1.7–2.4)

## 2017-11-17 LAB — PHOSPHORUS: PHOSPHORUS: 3.5 mg/dL (ref 2.5–4.6)

## 2017-11-17 MED ORDER — MUPIROCIN 2 % EX OINT
1.0000 "application " | TOPICAL_OINTMENT | Freq: Two times a day (BID) | CUTANEOUS | Status: DC
  Administered 2017-11-17 – 2017-11-19 (×5): 1 via NASAL
  Filled 2017-11-17: qty 22

## 2017-11-17 MED ORDER — ORAL CARE MOUTH RINSE
15.0000 mL | Freq: Two times a day (BID) | OROMUCOSAL | Status: DC
  Administered 2017-11-18 – 2017-11-19 (×3): 15 mL via OROMUCOSAL

## 2017-11-17 MED ORDER — POTASSIUM CHLORIDE 10 MEQ/100ML IV SOLN
10.0000 meq | INTRAVENOUS | Status: AC
  Administered 2017-11-17 (×2): 10 meq via INTRAVENOUS
  Filled 2017-11-17 (×2): qty 100

## 2017-11-17 MED ORDER — SODIUM CHLORIDE 0.9% FLUSH
10.0000 mL | Freq: Two times a day (BID) | INTRAVENOUS | Status: DC
  Administered 2017-11-18: 10 mL
  Administered 2017-11-18: 20 mL
  Administered 2017-11-19: 10 mL

## 2017-11-17 MED ORDER — CHLORHEXIDINE GLUCONATE CLOTH 2 % EX PADS
6.0000 | MEDICATED_PAD | Freq: Every day | CUTANEOUS | Status: DC
  Administered 2017-11-17 – 2017-11-19 (×3): 6 via TOPICAL

## 2017-11-17 MED ORDER — POTASSIUM CHLORIDE 10 MEQ/100ML IV SOLN
10.0000 meq | INTRAVENOUS | Status: AC
  Administered 2017-11-17 (×4): 10 meq via INTRAVENOUS
  Filled 2017-11-17 (×4): qty 100

## 2017-11-17 MED ORDER — SODIUM CHLORIDE 0.9% FLUSH: 10.0000 mL | INTRAVENOUS | Status: DC | PRN

## 2017-11-17 MED ORDER — CHLORHEXIDINE GLUCONATE 0.12 % MT SOLN
15.0000 mL | Freq: Two times a day (BID) | OROMUCOSAL | Status: DC
  Administered 2017-11-18 – 2017-11-20 (×4): 15 mL via OROMUCOSAL
  Filled 2017-11-17 (×5): qty 15

## 2017-11-17 NOTE — Progress Notes (Signed)
Daily Progress Note   Patient Name: Shari Dean       Date: 11/17/2017 DOB: 05/11/1941  Age: 77 y.o. MRN#: [id number] Attending Physician: Shari Dean* Primary Care Physician: Shari Brunner, MD Admit Date: 11/14/2017  Reason for Consultation/Follow-up: Establishing goals of care and Psychosocial/spiritual support  Subjective: Shari Dean is lying quietly in bed. Her eyes), but she does not look in my direction or try to interact with me in any way. She has known dementia. There is no family at bedside at this time.  Conference with hospitalist, Shari Dean related to plan of care. Detailed conference with nursing staff related to plan of care, psychosocial burden of end-stage dementia.  Length of Stay: 3  Current Medications: Scheduled Meds:  . apixaban  2.5 mg Oral BID  . Chlorhexidine Gluconate Cloth  6 each Topical Q0600  . feeding supplement (PRO-STAT SUGAR FREE 64)  30 mL Oral BID  . ipratropium-albuterol  3 mL Inhalation QID  . mupirocin ointment  1 application Nasal BID  . senna  1 tablet Oral QHS  . sertraline  25 mg Oral Daily    Continuous Infusions: . ceFEPime (MAXIPIME) IV Stopped (11/16/17 1430)  . dextrose 125 mL/hr at 11/17/17 0758  . vancomycin Stopped (11/16/17 1906)    PRN Meds: promethazine  Physical Exam  Constitutional: No distress.  Appears acutely/chronically ill, contracted, does not try to communicate even her most basic needs  HENT:  Head: Atraumatic.  temporal wasting  Cardiovascular: Normal rate.  Pulmonary/Chest: Effort normal. No respiratory distress.  Abdominal: Soft. She exhibits no distension.  Musculoskeletal: She exhibits deformity. She exhibits no edema.  contracted  Neurological:  Eyes are open, but  known to be nonverbal. Does not try to communicate in any way, known dementia  Skin: Skin is warm and dry.  Wounds as noted by nursing  Nursing note and vitals reviewed.           Vital Signs: BP (!) 119/52 (BP Location: Left Arm)   Pulse 96   Temp 98.7 F (37.1 C) (Axillary)   Resp 16   Ht 5\' 7"  (1.702 m)   Wt 49.8 kg (109 lb 12.6 oz)   SpO2 98%   BMI 17.20 kg/m  SpO2: SpO2: 98 % O2 Device: O2 Device: Nasal Cannula O2 Flow  Rate: O2 Flow Rate (L/min): 3.5 L/min  Intake/output summary:   Intake/Output Summary (Last 24 hours) at 11/17/2017 1409 Last data filed at 11/16/2017 1956 Gross per 24 hour  Intake -  Output 900 ml  Net -900 ml   LBM: Last BM Date: 11/16/17 Baseline Weight: Weight: 49.9 kg (110 lb 0.2 oz) Most recent weight: Weight: 49.8 kg (109 lb 12.6 oz)       Palliative Assessment/Data:    Flowsheet Rows     Most Recent Value  Intake Tab  Referral Department  Hospitalist  Unit at Time of Referral  Med/Surg Unit  Date Notified  11/15/17  Palliative Care Type  New Palliative care  Reason for referral  Clarify Goals of Care, Counsel Regarding Hospice  Date of Admission  11/14/17  Date first seen by Palliative Care  11/16/17  # of days Palliative referral response time  1 Day(s)  # of days IP prior to Palliative referral  1  Clinical Assessment  Palliative Performance Scale Score  30%  Pain Max last 24 hours  Not able to report  Pain Min Last 24 hours  Not able to report  Dyspnea Max Last 24 Hours  Not able to report  Dyspnea Min Last 24 hours  Not able to report  Psychosocial & Spiritual Assessment  Palliative Care Outcomes  Patient/Family meeting held?  Yes      Patient Active Problem List   Diagnosis Date Noted  . HCAP (healthcare-associated pneumonia) 11/14/2017  . Severe dehydration 11/14/2017  . Decubitus ulcer of left foot 09/23/2017  . Decubitus ulcer of ischium, left, stage IV ([place]) 09/23/2017  . History of pulmonary embolus (PE)  09/23/2017  . Palliative care encounter   . Goals of care, counseling/discussion   . DNR (do not resuscitate) discussion   . AKI (acute kidney injury) ([place])   . Hypernatremia   . Dementia 07/17/2012  . Osteoarthritis 07/17/2012    Palliative Care Assessment & Plan   Patient Profile: 77 y.o. female  with past medical history of advanced dementia, bedbound, nonverbal, SNF resident, admitted on 11/14/2017 with dehydration, healthcare acquired pneumonia.  Assessment: Pneumonia; clinically improving, remains frail and likely to decline. End-stage dementia; bedbound, contracted, nonverbal. Experiencing failure to thrive due to poor PO intake. Weight loss.   Recommendations/Plan:  At this point, continue to treat the treatable but no CPR no intubation.  End-of-life discussions.  Goals of Care and Additional Recommendations:  Limitations on Scope of Treatment: treat the treatable but no CPR, no intubation, no PEG tube  Code Status:    Code Status Orders  (From admission, onward)        Start     Ordered   11/14/17 1731  Full code  Continuous     11/14/17 1732    Code Status History    Date Active Date Inactive Code Status Order ID Comments User Context   11/11/2016 05:27 11/12/2016 18:29 Full Code [id number]  Shari Battiest, RN Inpatient   11/10/2016 15:52 11/11/2016 05:27 DNR [id number]  Shari Novel, NP Inpatient   11/08/2016 13:40 11/10/2016 15:52 Full Code [id number]  Shari Fire, MD ED   07/18/2012 00:38 07/19/2012 19:10 Full Code [id number]  Shari Larsson, RN Inpatient    Advance Directive Documentation     Most Recent Value  Type of Advance Directive  Out of facility DNR (pink MOST or yellow form)  Pre-existing out of facility DNR order (yellow form or pink MOST form)  No data  "MOST"  Form in Place?  No data       Prognosis:   < 6 months or less  would not be surprising based on functional status, advanced dementia, failure to thrive with poor by mouth  intake.  Discharge Planning:  likely return to residential SNF, [place].   Care plan was discussed with nursing staff, case management, social worker, and Shari Dean on next rounds.  Thank you for allowing the Palliative Medicine Team to assist in the care of this patient.   Time In: 1310 Time Out: 1330 Total Time 20 minutes Prolonged Time Billed  no       Greater than 50%  of this time was spent counseling and coordinating care related to the above assessment and plan.  Shari Novel, NP  Please contact Palliative Medicine Team phone at [phone] for questions and concerns.

## 2017-11-17 NOTE — Progress Notes (Signed)
CRITICAL VALUE ALERT  Critical Value:  Potassium 2.6  Date & Time Notied:  11/17/2017 0600  Provider Notified: Olevia Bowens, MD @ [phone]  Orders Received/Actions taken: yes; see orders

## 2017-11-17 NOTE — Progress Notes (Addendum)
PROGRESS NOTE    Shari Dean  QPR:[id number] DOB: 11/15/1940 DOA: 11/14/2017 PCP: Madelyn Brunner, MD     Brief Narrative:  77 year old woman admitted from SNF on 1/26 due to abnormal labs when she was found to have an elevated creatinine.  She has severe dementia and is nonverbal at baseline.  She has been admitted once before for pneumonia and dehydration.  She is found to have pneumonia on chest x-ray as well as evidence of severe dehydration.  Admission requested.   Assessment & Plan:   Principal Problem:   HCAP (healthcare-associated pneumonia) Active Problems:   Dementia   Hypernatremia   AKI (acute kidney injury) ([place])   History of pulmonary embolus (PE)   Severe dehydration   Hospital-acquired pneumonia -MRSA PCR is positive hence will continue treatment with vancomycin and cefepime for now. -Culture data remains negative to date.  Acute renal failure -Likely due to prerenal azotemia and severe dehydration. -Follow with IV fluids. Improving. -Renal ultrasound negative for hydronephrosis or obstruction.  Hypernatremia/hyperchloremia -Secondary to severe dehydration in an elderly person with dementia who is not able to make her thirst needs apparent. -Continue D5W.  -Improved; Na 158.  Hypokalemia -Replace IV, check magnesium.   History of PE -On Eliquis; need to address whether it is appropriate for her to continue anticoagulation given her severe dementia a high fall risk.  Advanced dementia -Per family member she is currently at baseline which is nonverbal, contracted.  Adult failure to thrive -We will request palliative care consultation in this elderly lady with pneumonia and severe dehydration to clarify goals of care as this will only become a vicious cycle. -She would benefit from hospice placement.  Pressure ulcer of the sacrum, stage IV -Have requested wound care for further recommendations. -Present prior to admission.       DVT  prophylaxis: On Eliquis Code Status: Remains full code at present Family Communication: Son and guardian at bedside updated on plan of care.  He has agreed to DNR, seems to be more amenable to hospice but states he needs an extra day to continue to think about it, will need to continue palliative care discussions. Disposition Plan: Pending medical stability and results of palliative care consultation  Consultants:   Palliative care  Procedures:   None  Antimicrobials:  Anti-infectives (From admission, onward)   Start     Dose/Rate Route Frequency Ordered Stop   11/15/17 1800  vancomycin (VANCOCIN) 500 mg in sodium chloride 0.9 % 100 mL IVPB     500 mg 100 mL/hr over 60 Minutes Intravenous Every 24 hours 11/15/17 0853     11/15/17 1330  ceFEPIme (MAXIPIME) 1 g in dextrose 5 % 50 mL IVPB     1 g 100 mL/hr over 30 Minutes Intravenous Every 24 hours 11/15/17 1125     11/14/17 1800  ceFEPIme (MAXIPIME) 1 g in dextrose 5 % 50 mL IVPB     1 g 100 mL/hr over 30 Minutes Intravenous  Once 11/14/17 1732 11/15/17 0200   11/14/17 1800  vancomycin (VANCOCIN) IVPB 1000 mg/200 mL premix     1,000 mg 200 mL/hr over 60 Minutes Intravenous  Once 11/14/17 1755 11/15/17 0112   11/14/17 1545  cefTRIAXone (ROCEPHIN) 1 g in dextrose 5 % 50 mL IVPB  Status:  Discontinued     1 g 100 mL/hr over 30 Minutes Intravenous  Once 11/14/17 1534 11/14/17 1732   11/14/17 1545  azithromycin (ZITHROMAX) 500 mg in dextrose 5 %  250 mL IVPB     500 mg 250 mL/hr over 60 Minutes Intravenous  Once 11/14/17 1534 11/14/17 1718       Subjective: Lying in bed, opens eyes to voice.  Objective: Vitals:   11/16/17 2209 11/17/17 0700 11/17/17 0805 11/17/17 1122  BP: (!) 106/53 (!) 119/52    Pulse: 95 96    Resp: 14 16    Temp: 99.5 F (37.5 C) 98.7 F (37.1 C)    TempSrc: Axillary Axillary    SpO2: 100% 100% 98% 98%  Weight:      Height:        Intake/Output Summary (Last 24 hours) at 11/17/2017 1523 Last data  filed at 11/16/2017 1956 Gross per 24 hour  Intake -  Output 900 ml  Net -900 ml   Filed Weights   11/14/17 1231 11/14/17 2010 11/15/17 0304  Weight: 49.9 kg (110 lb 0.2 oz) 49.8 kg (109 lb 12.6 oz) 49.8 kg (109 lb 12.6 oz)    Examination:  General exam: Awake, unable to assess orientation given nonverbal state, appears chronically ill and cachectic, is contracted Respiratory system: Clear to auscultation. Respiratory effort normal. Cardiovascular system:RRR. No murmurs, rubs, gallops. Gastrointestinal system: Abdomen is nondistended, soft and nontender. No organomegaly or masses felt. Normal bowel sounds heard. Central nervous system: Unable to assess given current mental state Extremities: No C/C/E, +pedal pulses Skin: Sacral decubitus ulcer, likely stage IV Psychiatry: Unable to assess given current mental state    Data Reviewed: I have personally reviewed following labs and imaging studies  CBC: Recent Labs  Lab 11/14/17 1334 11/15/17 0615 11/17/17 0450  WBC 16.1* 12.4* 11.2*  NEUTROABS 14.0* 11.0*  --   HGB 10.6* 8.6* 8.7*  HCT 36.5 29.7* 29.4*  MCV 87.7 87.1 87.0  PLT 275 195 606   Basic Metabolic Panel: Recent Labs  Lab 11/14/17 1334 11/14/17 2000 11/15/17 0615 11/17/17 0450  NA 169* 167* 165* 158*  K 3.2* 2.9* 3.2* 2.6*  CL 129* 129* >130* 130*  CO2 20* 20* 15* 16*  GLUCOSE 154* 135* 102* 125*  BUN 113* 115* 105* 71*  CREATININE 3.94* 3.92* 3.31* 2.48*  CALCIUM 8.0* 7.8* 7.5* 8.3*  MG  --   --   --  2.1  PHOS  --   --   --  3.5   GFR: Estimated Creatinine Clearance: 15.2 mL/min (A) (by C-G formula based on SCr of 2.48 mg/dL (H)). Liver Function Tests: Recent Labs  Lab 11/14/17 1334  AST 30  ALT 43  ALKPHOS 77  BILITOT 0.4  PROT 7.3  ALBUMIN 2.6*   No results for input(s): LIPASE, AMYLASE in the last 168 hours. No results for input(s): AMMONIA in the last 168 hours. Coagulation Profile: No results for input(s): INR, PROTIME in the last  168 hours. Cardiac Enzymes: No results for input(s): CKTOTAL, CKMB, CKMBINDEX, TROPONINI in the last 168 hours. BNP (last 3 results) No results for input(s): PROBNP in the last 8760 hours. HbA1C: No results for input(s): HGBA1C in the last 72 hours. CBG: No results for input(s): GLUCAP in the last 168 hours. Lipid Profile: No results for input(s): CHOL, HDL, LDLCALC, TRIG, CHOLHDL, LDLDIRECT in the last 72 hours. Thyroid Function Tests: No results for input(s): TSH, T4TOTAL, FREET4, T3FREE, THYROIDAB in the last 72 hours. Anemia Panel: No results for input(s): VITAMINB12, FOLATE, FERRITIN, TIBC, IRON, RETICCTPCT in the last 72 hours. Urine analysis:    Component Value Date/Time   COLORURINE AMBER (A) 11/14/2017 1305  APPEARANCEUR HAZY (A) 11/14/2017 1305   LABSPEC 1.020 11/14/2017 1305   PHURINE 5.0 11/14/2017 1305   GLUCOSEU NEGATIVE 11/14/2017 1305   HGBUR LARGE (A) 11/14/2017 1305   BILIRUBINUR NEGATIVE 11/14/2017 1305   KETONESUR NEGATIVE 11/14/2017 1305   PROTEINUR NEGATIVE 11/14/2017 1305   UROBILINOGEN 1.0 07/18/2012 1714   NITRITE NEGATIVE 11/14/2017 1305   LEUKOCYTESUR NEGATIVE 11/14/2017 1305   Sepsis Labs: @LABRCNTIP (procalcitonin:4,lacticidven:4)  ) Recent Results (from the past 240 hour(s))  Urine culture     Status: None   Collection Time: 11/14/17  1:05 PM  Result Value Ref Range Status   Specimen Description URINE, CATHETERIZED  Final   Special Requests NONE  Final   Culture   Final    NO GROWTH Performed at [facility], [place] [address] [place], [place] [address]    Report Status 11/15/2017 FINAL  Final  Blood Culture (routine x 2)     Status: None (Preliminary result)   Collection Time: 11/14/17  1:35 PM  Result Value Ref Range Status   Specimen Description BLOOD LEFT ARM  Final   Special Requests   Final    BOTTLES DRAWN AEROBIC AND ANAEROBIC Blood Culture adequate volume   Culture NO GROWTH 3 DAYS  Final   Report Status PENDING   Incomplete  Blood Culture (routine x 2)     Status: None (Preliminary result)   Collection Time: 11/14/17  1:35 PM  Result Value Ref Range Status   Specimen Description BLOOD LEFT HAND  Final   Special Requests   Final    BOTTLES DRAWN AEROBIC AND ANAEROBIC Blood Culture adequate volume   Culture NO GROWTH 3 DAYS  Final   Report Status PENDING  Incomplete  MRSA PCR Screening     Status: Abnormal   Collection Time: 11/15/17 11:22 AM  Result Value Ref Range Status   MRSA by PCR POSITIVE (A) NEGATIVE Final    Comment:        The GeneXpert MRSA Assay (FDA approved for NASAL specimens only), is one component of a comprehensive MRSA colonization surveillance program. It is not intended to diagnose MRSA infection nor to guide or monitor treatment for MRSA infections. RESULT CALLED TO, READ BACK BY AND VERIFIED WITH: JACKSON @ [phone] BY HENDERSON L          Radiology Studies: US Renal  Result Date: 11/15/2017 CLINICAL DATA:  Acute renal failure. EXAM: RENAL / URINARY TRACT ULTRASOUND COMPLETE COMPARISON:  None. FINDINGS: Right Kidney: Length: 8.8 cm. Echogenicity within normal limits. No mass or hydronephrosis visualized. Left Kidney: Length: 10.0 cm. Echogenicity within normal limits. No mass or hydronephrosis visualized. Bladder: Appears normal for degree of bladder distention. IMPRESSION: Normal appearance of both kidneys.  No evidence of hydronephrosis. Electronically Signed   By: Earle Gell M.D.   On: 11/15/2017 16:48        Scheduled Meds: . apixaban  2.5 mg Oral BID  . Chlorhexidine Gluconate Cloth  6 each Topical Q0600  . feeding supplement (PRO-STAT SUGAR FREE 64)  30 mL Oral BID  . ipratropium-albuterol  3 mL Inhalation QID  . mupirocin ointment  1 application Nasal BID  . senna  1 tablet Oral QHS  . sertraline  25 mg Oral Daily   Continuous Infusions: . ceFEPime (MAXIPIME) IV Stopped (11/16/17 1430)  . dextrose 125 mL/hr at 11/17/17 0758  . potassium  chloride    . vancomycin Stopped (11/16/17 1906)     LOS: 3 days  Time spent: 25 minutes. Greater than 50% of this time was spent in direct contact with the patient coordinating care.     Lelon Frohlich, MD [facility] Pager [phone]  If 7PM-7AM, please contact night-coverage [email or web address] Password [facility] 11/17/2017, 3:23 PM

## 2017-11-17 NOTE — Plan of Care (Signed)
Pt rested throughout night.

## 2017-11-18 DIAGNOSIS — Z86711 Personal history of pulmonary embolism: Secondary | ICD-10-CM

## 2017-11-18 DIAGNOSIS — E870 Hyperosmolality and hypernatremia: Secondary | ICD-10-CM

## 2017-11-18 DIAGNOSIS — Z7189 Other specified counseling: Secondary | ICD-10-CM

## 2017-11-18 DIAGNOSIS — L8990 Pressure ulcer of unspecified site, unspecified stage: Secondary | ICD-10-CM

## 2017-11-18 LAB — CBC
HCT: 26 % — ABNORMAL LOW (ref 36.0–46.0)
Hemoglobin: 8.1 g/dL — ABNORMAL LOW (ref 12.0–15.0)
MCH: 26.1 pg (ref 26.0–34.0)
MCHC: 31.2 g/dL (ref 30.0–36.0)
MCV: 83.9 fL (ref 78.0–100.0)
PLATELETS: 185 10*3/uL (ref 150–400)
RBC: 3.1 MIL/uL — AB (ref 3.87–5.11)
RDW: 18.9 % — AB (ref 11.5–15.5)
WBC: 10.2 10*3/uL (ref 4.0–10.5)

## 2017-11-18 LAB — BASIC METABOLIC PANEL
Anion gap: 11 (ref 5–15)
BUN: 48 mg/dL — AB (ref 6–20)
CHLORIDE: 120 mmol/L — AB (ref 101–111)
CO2: 14 mmol/L — AB (ref 22–32)
CREATININE: 1.89 mg/dL — AB (ref 0.44–1.00)
Calcium: 7.6 mg/dL — ABNORMAL LOW (ref 8.9–10.3)
GFR calc Af Amer: 29 mL/min — ABNORMAL LOW (ref >60)
GFR calc non Af Amer: 25 mL/min — ABNORMAL LOW (ref >60)
GLUCOSE: 118 mg/dL — AB (ref 65–99)
Potassium: 2.8 mmol/L — ABNORMAL LOW (ref 3.5–5.1)
Sodium: 145 mmol/L (ref 135–145)

## 2017-11-18 LAB — MAGNESIUM: MAGNESIUM: 1.7 mg/dL (ref 1.7–2.4)

## 2017-11-18 MED ORDER — POTASSIUM CL IN DEXTROSE 5% 20 MEQ/L IV SOLN
20.0000 meq | INTRAVENOUS | Status: DC
  Administered 2017-11-18 – 2017-11-19 (×3): 20 meq via INTRAVENOUS
  Filled 2017-11-18 (×6): qty 1000

## 2017-11-18 MED ORDER — APIXABAN 5 MG PO TABS
5.0000 mg | ORAL_TABLET | Freq: Two times a day (BID) | ORAL | Status: DC
  Filled 2017-11-18: qty 1

## 2017-11-18 MED ORDER — MAGNESIUM SULFATE 2 GM/50ML IV SOLN
2.0000 g | Freq: Once | INTRAVENOUS | Status: AC
  Administered 2017-11-18: 2 g via INTRAVENOUS
  Filled 2017-11-18: qty 50

## 2017-11-18 MED ORDER — DOXYCYCLINE HYCLATE 100 MG IV SOLR
100.0000 mg | Freq: Two times a day (BID) | INTRAVENOUS | Status: DC
  Administered 2017-11-18 – 2017-11-19 (×3): 100 mg via INTRAVENOUS
  Filled 2017-11-18 (×5): qty 100

## 2017-11-18 MED ORDER — MORPHINE SULFATE (CONCENTRATE) 10 MG/0.5ML PO SOLN
2.5000 mg | ORAL | Status: DC | PRN
  Administered 2017-11-18 – 2017-11-19 (×3): 2.6 mg via ORAL
  Filled 2017-11-18 (×3): qty 0.5

## 2017-11-18 MED ORDER — POTASSIUM CHLORIDE 10 MEQ/100ML IV SOLN
10.0000 meq | INTRAVENOUS | Status: AC
Start: 2017-11-18 — End: 2017-11-18
  Administered 2017-11-18 (×4): 10 meq via INTRAVENOUS
  Filled 2017-11-18 (×4): qty 100

## 2017-11-18 NOTE — Clinical Social Work Note (Signed)
Per attending's request, referral was made to [facility].

## 2017-11-18 NOTE — Progress Notes (Signed)
Daily Progress Note   Patient Name: Shari Dean       Date: 11/18/2017 DOB: [birth year]  Age: 77 y.o. MRN#: [id number] Attending Physician: Murlean Iba, MD Primary Care Physician: Madelyn Brunner, MD Admit Date: 11/14/2017  Reason for Consultation/Follow-up: Establishing goals of care, Inpatient hospice referral and Psychosocial/spiritual support  Subjective: Mrs. Mires is lying quietly in bed.  She does not open her eyes or try to interact with me in any way.  She has advanced dementia, nonverbal, bedbound, poor by mouth intake.  Present today at bedside is son/legal guardian Fredrich Romans, and son Courtney Heys.    Rush Landmark states that he is elected to allow a natural death (DNR).  Sons ask about the benefits of hospice.  We talked about hospice services if she were to return to her residential SNF.  Both son states that they would not want her to return to residential SNF.  We talked about residential hospice in detail.  I share that IV fluids or antibiotics will not be given.  We talked about the benefits of concentrated endorphins without IV fluids, and that hydration can cause discomfort.  Rush Landmark states that he would request comfort and dignity at end of life, residential/inpatient hospice services with hospice of [facility]  He states that he understands at this point there are no beds available, and his preference is she stay in the hospital as long as possible.  We talked about the difference in care in the hospital versus residential hospice.  Rush Landmark states that he and his brother will likely ride to the hospice home to take a look.  No further questions.  Length of Stay: 4  Current Medications: Scheduled Meds:  . apixaban  5 mg Oral BID  . chlorhexidine  15 mL Mouth  Rinse BID  . Chlorhexidine Gluconate Cloth  6 each Topical Q0600  . feeding supplement (PRO-STAT SUGAR FREE 64)  30 mL Oral BID  . ipratropium-albuterol  3 mL Inhalation QID  . mouth rinse  15 mL Mouth Rinse q12n4p  . mupirocin ointment  1 application Nasal BID  . senna  1 tablet Oral QHS  . sertraline  25 mg Oral Daily  . sodium chloride flush  10-40 mL Intracatheter Q12H    Continuous Infusions: . dextrose 5 % with KCl  20 mEq / L 20 mEq (11/18/17 1243)  . doxycycline (VIBRAMYCIN) IV Stopped (11/18/17 1243)    PRN Meds: promethazine, sodium chloride flush  Physical Exam  Constitutional: No distress.  Appears acutely/chronically ill, frail  HENT:  Head: Atraumatic.  Temporal wasting  Cardiovascular: Normal rate.  Pulmonary/Chest: Effort normal. No respiratory distress.  Abdominal: Soft. She exhibits no distension.  Musculoskeletal: She exhibits no edema.  Contracted, muscle wasting  Neurological:  Sleepy, known dementia  Skin: Skin is warm and dry.  Nursing note and vitals reviewed.           Vital Signs: BP 121/62 (BP Location: Left Arm)   Pulse (!) 110   Temp 97.7 F (36.5 C) (Axillary)   Resp 17   Ht 5\' 7"  (1.702 m)   Wt 49.8 kg (109 lb 12.6 oz)   SpO2 98%   BMI 17.20 kg/m  SpO2: SpO2: 98 % O2 Device: O2 Device: Nasal Cannula O2 Flow Rate: O2 Flow Rate (L/min): 2 L/min  Intake/output summary:   Intake/Output Summary (Last 24 hours) at 11/18/2017 1356 Last data filed at 11/18/2017 1217 Gross per 24 hour  Intake 5602.91 ml  Output 1100 ml  Net 4502.91 ml   LBM: Last BM Date: 11/16/17 Baseline Weight: Weight: 49.9 kg (110 lb 0.2 oz) Most recent weight: Weight: 49.8 kg (109 lb 12.6 oz)       Palliative Assessment/Data:    Flowsheet Rows     Most Recent Value  Intake Tab  Referral Department  Hospitalist  Unit at Time of Referral  Med/Surg Unit  Date Notified  11/15/17  Palliative Care Type  New Palliative care  Reason for referral  Clarify Goals  of Care, Counsel Regarding Hospice  Date of Admission  11/14/17  Date first seen by Palliative Care  11/16/17  # of days Palliative referral response time  1 Day(s)  # of days IP prior to Palliative referral  1  Clinical Assessment  Palliative Performance Scale Score  30%  Pain Max last 24 hours  Not able to report  Pain Min Last 24 hours  Not able to report  Dyspnea Max Last 24 Hours  Not able to report  Dyspnea Min Last 24 hours  Not able to report  Psychosocial & Spiritual Assessment  Palliative Care Outcomes  Patient/Family meeting held?  Yes      Patient Active Problem List   Diagnosis Date Noted  . Pressure injury of skin 11/18/2017  . HCAP (healthcare-associated pneumonia) 11/14/2017  . Severe dehydration 11/14/2017  . Decubitus ulcer of left foot 09/23/2017  . Decubitus ulcer of ischium, left, stage IV ([place]) 09/23/2017  . History of pulmonary embolus (PE) 09/23/2017  . Palliative care encounter   . Goals of care, counseling/discussion   . DNR (do not resuscitate) discussion   . AKI (acute kidney injury) ([place])   . Hypernatremia   . Dementia 07/17/2012  . Osteoarthritis 07/17/2012    Palliative Care Assessment & Plan   Patient Profile: Pneumonia; clinically improving, remains frail and likely to decline. End-stage dementia; bedbound, contracted, nonverbal. Experiencing failure to thrive due to poor PO intake. Weight loss.   Recommendations/Plan:  Transfer to hospice home of [facility] for comfort and dignity at end of life..  Recommendations/Plan:  Transfer to hospice home of [facility] for comfort and dignity at end of life.  No further IV antibiotics or IV fluids.  Goals of Care and Additional Recommendations:  Limitations on Scope of Treatment: Full Comfort Care  Code Status:    Code Status Orders  (From admission, onward)        Start     Ordered   11/17/17 1523  Do not attempt resuscitation (DNR)  Continuous    Question Answer  Comment  In the event of cardiac or respiratory ARREST Do not call a "code blue"   In the event of cardiac or respiratory ARREST Do not perform Intubation, CPR, defibrillation or ACLS   In the event of cardiac or respiratory ARREST Use medication by any route, position, wound care, and other measures to relive pain and suffering. May use oxygen, suction and manual treatment of airway obstruction as needed for comfort.      11/17/17 1522    Code Status History    Date Active Date Inactive Code Status Order ID Comments User Context   11/14/2017 17:32 11/17/2017 15:22 Full Code [id number]  Phillips Grout, MD ED   11/11/2016 05:27 11/12/2016 18:29 Full Code [id number]  Rubbie Battiest, RN Inpatient   11/10/2016 15:52 11/11/2016 05:27 DNR [id number]  Drue Novel, NP Inpatient   11/08/2016 13:40 11/10/2016 15:52 Full Code [id number]  Rosita Fire, MD ED   07/18/2012 00:38 07/19/2012 19:10 Full Code [id number]  Noreene Larsson, RN Inpatient    Advance Directive Documentation     Most Recent Value  Type of Advance Directive  Out of facility DNR (pink MOST or yellow form)  Pre-existing out of facility DNR order (yellow form or pink MOST form)  No data  "MOST" Form in Place?  No data       Prognosis:   < 4 weeks, 2 weeks or less would not be surprising.  Discharge Planning:  Family is requesting comfort and dignity at end of life, residential hospice with [facility].  Care plan was discussed with nursing staff, social worker, and Dr. Wynetta Emery  Thank you for allowing the Palliative Medicine Team to assist in the care of this patient.   Time In: 1310 Time Out: 1345 Total Time 35 minutes Prolonged Time Billed  no       Greater than 50%  of this time was spent counseling and coordinating care related to the above assessment and plan.  Drue Novel, NP  Please contact Palliative Medicine Team phone at [phone] for questions and concerns.

## 2017-11-18 NOTE — Clinical Social Work Note (Signed)
Patient Information   Patient Name Shari Dean, Shari Dean ([id number]) Sex Female DOB Sep 30, 1941 SSN [id number]  Room Bed  A326 A326-01  Patient Demographics   Address [address] [place] [address] Phone [phone] (Home) [phone] (Mobile) E-mail Address [email or web address]  Patient Ethnicity & Race   Ethnic Group Patient Race  Not Hispanic or Latino White or Caucasian  Emergency Contact(s)   Name Relation Home Work Mobile  Henchey,William Legal Guardian [phone]  Dannae, Kato [phone]    Ginger Organ [phone]    Documents on File    Status Date Received Description  Documents for the Patient  EMR Patient Summary Not Received    Release of Information Not Received    Insurance Card Not Received    [place] Received 08/06/11   [place] E-Signature HIPAA Notice of Privacy Signed 11/08/16   [place] E-Signature HIPAA Notice of Privacy [place] Not Received    Driver's License Not Received    Advance Directives/Living Will/HCPOA/POA Not Received    Financial Application Not Received    Insurance Card Not Received    Insurance Card Not Received    Other Photo ID Not Received    AMB HH/NH/Hospice Received 09/16/17 ORDER [facility]  AMB Correspondence Received 09/30/17 11/18 EVALUATION [facility]  AMB Correspondence Received 08/25/17 PROGRESS NOTES [place]  Documents for the Encounter  AOB (Assignment of Insurance Benefits) Not Received    E-signature AOB Signed 11/14/17   MEDICARE RIGHTS Not Received    E-signature Medicare Rights Signed 11/14/17   ED Patient Billing Extract   ED PB Billing Extract  Cardiac Monitoring Strip Received 11/14/17   Cardiac Monitoring Strip Shift Summary Received 11/14/17   Ultrasound Received 11/15/17   EKG Received 11/16/17   Admission Information   Attending Provider Admitting Provider  Admission Type Admission Date/Time  Murlean Iba, MD Phillips Grout, MD Emergency 11/14/17 1223  Discharge Date Hospital Service Auth/Cert Status Service Area   Internal Medicine Incomplete [place]  Unit Room/Bed Admission Status   AP-DEPT 300 A326/A326-01 Admission (Confirmed)   Admission   Complaint  Altered Mental Status  Hospital Account   Name Acct ID Class Status Primary Coverage  Ulla, Mckiernan [id number] Inpatient Open MEDICARE - MEDICARE PART A AND B      Guarantor Account (for [place] [id number])   Name Relation to Pt Service Area Active? Acct Type  Merlyn Albert Self CHSA Yes Personal/Family  Address Phone    [address] [place], [place] [address] [phone])        Coverage Information (for Hospital Account [id number])   1. [place] PART A AND B   F/O Payor/Plan Precert #  MEDICARE/MEDICARE PART A AND B   Subscriber Subscriber #  Anwar, Crill  Address Phone  [address] [place], [place] [address]   2. MEDICAID [place]/MEDICAID OF [place]   F/O Payor/Plan Precert #  MEDICAID [place]/MEDICAID OF [place]   Subscriber Subscriber #  Kamyiah, Colantonio [id number]  Address Phone  [address] [place], [place] [address] [phone]

## 2017-11-18 NOTE — Evaluation (Signed)
Clinical/Bedside Swallow Evaluation Patient Details  Name: Shari Dean MRN: [id number] Date of Birth: 01-12-1941  Today's Date: 11/18/2017 Time: SLP Start Time (ACUTE ONLY): 0900 SLP Stop Time (ACUTE ONLY): 0926 SLP Time Calculation (min) (ACUTE ONLY): 26 min  Past Medical History:  Past Medical History:  Diagnosis Date  . Chronic cystitis   . Chronic cystitis with hematuria   . Chronic pain   . Dementia   . Depression   . Dysphagia   . Epigastric pain   . Hx MRSA infection   . Hyperlipidemia   . Hyperlipidemia   . Impaired cognition   . Incontinence   . 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 Norovirus   . Osteoarthritis   . Osteoarthritis   . Osteopenia   . Pressure ulcer of unspecified site, stage 4 ([place])   . Renal disorder    kidney failure   Past Surgical History: History reviewed. No pertinent surgical history. HPI:  77 year old woman admitted from SNF on 1/26 due to abnormal labs when she was found to have an elevated creatinine.  She has severe dementia and is nonverbal at baseline.  She has been admitted once before for pneumonia and dehydration.  She is found to have pneumonia on chest x-ray as well as evidence of severe dehydration.  Admission requested.   Assessment / Plan / Recommendation Clinical Impression  Pt presents with severe cognitive based dysphagia with limited interaction during SLP visit (oral care, oral motor examination, ice chip trials). Pt moaning in room upon arrival. Oral care completed with some resistance and coughing elicited. Pt readily opened mouth when ice chip applied to lips and manipulated in oral cavity. No swallow palpated, however increased moaning, throat clearing, and eventual coughing. SLP contacted SLP at [facility] who stated that Pt was on a mechanical soft diet with thin liquids prior to admission. She reported that family took hours to feed her, but she typically took 80% with dedicated support.  Current chest xray shows PNA and Pt with severe dehydration. Prognosis for safe and efficient oral intake is guarded at this time due to severity of dementia and current status. Recommend NPO vs comfort feeds if family chooses. SLP will check back to discuss with family as they are not present at this time.   SLP Visit Diagnosis: Dysphagia, oropharyngeal phase (R13.12)    Aspiration Risk  Moderate aspiration risk;Risk for inadequate nutrition/hydration    Diet Recommendation NPO(NPO vs comfort/pleasure feeds)   Medication Administration: Crushed with puree Supervision: Staff to assist with self feeding;Full supervision/cueing for compensatory strategies Compensations: Slow rate Postural Changes: Seated upright at 90 degrees;Remain upright for at least 30 minutes after po intake    Other  Recommendations Oral Care Recommendations: Oral care before and after PO;Staff/trained caregiver to provide oral care Other Recommendations: Clarify dietary restrictions   Follow up Recommendations 24 hour supervision/assistance      Frequency and Duration min 2x/week  1 week       Prognosis Prognosis for Safe Diet Advancement: Guarded Barriers to Reach Goals: Cognitive deficits;Severity of deficits      Swallow Study   General Date of Onset: 11/14/17 HPI: 77 year old woman admitted from SNF on 1/26 due to abnormal labs when she was found to have an elevated creatinine.  She has severe dementia and is nonverbal at baseline.  She has been admitted once before for pneumonia and dehydration.  She is found to have pneumonia on chest x-ray as  well as evidence of severe dehydration.  Admission requested. Type of Study: Bedside Swallow Evaluation Previous Swallow Assessment: 11/09/16: BSE D2/thin Diet Prior to this Study: NPO(mech soft and thin at SNF) Temperature Spikes Noted: No Respiratory Status: Room air;Nasal cannula History of Recent Intubation: No Behavior/Cognition: Lethargic/Drowsy Oral  Cavity Assessment: Within Functional Limits(mild lingual coating) Oral Care Completed by SLP: Yes Oral Cavity - Dentition: Edentulous Vision: Impaired for self-feeding Self-Feeding Abilities: Total assist Patient Positioning: Upright in bed Baseline Vocal Quality: (Pt moaning) Volitional Cough: Cognitively unable to elicit Volitional Swallow: Unable to elicit    Oral/Motor/Sensory Function Overall Oral Motor/Sensory Function: (difficulty to assess due to decreased cognition)   Ice Chips Ice chips: Impaired Presentation: Spoon Oral Phase Impairments: Reduced lingual movement/coordination Oral Phase Functional Implications: Prolonged oral transit Pharyngeal Phase Impairments: Suspected delayed Swallow;Unable to trigger swallow;Throat Clearing - Delayed;Cough - Delayed   Thin Liquid Thin Liquid: Not tested    Nectar Thick Nectar Thick Liquid: Not tested   Honey Thick Honey Thick Liquid: Not tested   Puree Puree: Not tested   Solid   Thank you,  Shari Dean, CCC-SLP [phone]    Solid: Not tested        Shari Dean 11/18/2017,10:16 AM

## 2017-11-18 NOTE — Progress Notes (Addendum)
PROGRESS NOTE    Shari Dean  ZOX:[id number] DOB: 1941-03-06 DOA: 11/14/2017 PCP: Madelyn Brunner, MD     Brief Narrative:  77 year old woman admitted from SNF on 1/26 due to abnormal labs when she was found to have an elevated creatinine.  She has severe dementia and is nonverbal at baseline.  She has been admitted once before for pneumonia and dehydration.  She is found to have pneumonia on chest x-ray as well as evidence of severe dehydration.  Admission requested.  Assessment & Plan:   Principal Problem:   HCAP (healthcare-associated pneumonia) Active Problems:   Dementia   Hypernatremia   AKI (acute kidney injury) ([place])   History of pulmonary embolus (PE)   Severe dehydration   Pressure injury of skin  Hospital-acquired pneumonia -De-escalate antibiotics 1/30. -Culture data remains negative to date. -WBC trending down.   Acute renal failure -Likely due to prerenal azotemia and severe dehydration. -Follow with IV fluids. Improving. -Renal ultrasound negative for hydronephrosis or obstruction. -Improving with IVF hydration.  Hypernatremia/hyperchloremia -Secondary to severe dehydration in an elderly person with dementia who is not able to make her thirst needs apparent. -Continue D5W.  -Improved; trending down now at 145.  Hypokalemia -Replacing IV, give additional magnesium 1/30.   History of PE -On Eliquis  Advanced dementia -Per family member she is currently at baseline which is nonverbal, contracted.  Adult failure to thrive -We have requested palliative care consultation in this elderly lady with pneumonia and severe dehydration to clarify goals of care as this will only become a recurrent event. -She would benefit from hospice placement.  Pressure ulcer of the sacrum, stage IV -Have requested wound care for further recommendations. -Present prior to admission.  DVT prophylaxis: On Eliquis Code Status: DNRl  Family Communication: Son and  guardian at bedside updated on plan of care. Disposition Plan:  I spoke with legal guardian at the bedside and they requested residential hospice placement.  I spoke with the social worker and updated her to start making the arrangements.   Palliative care  Procedures:   None  Antimicrobials:  Anti-infectives (From admission, onward)   Start     Dose/Rate Route Frequency Ordered Stop   11/15/17 1800  vancomycin (VANCOCIN) 500 mg in sodium chloride 0.9 % 100 mL IVPB     500 mg 100 mL/hr over 60 Minutes Intravenous Every 24 hours 11/15/17 0853     11/15/17 1330  ceFEPIme (MAXIPIME) 1 g in dextrose 5 % 50 mL IVPB     1 g 100 mL/hr over 30 Minutes Intravenous Every 24 hours 11/15/17 1125     11/14/17 1800  ceFEPIme (MAXIPIME) 1 g in dextrose 5 % 50 mL IVPB     1 g 100 mL/hr over 30 Minutes Intravenous  Once 11/14/17 1732 11/15/17 0200   11/14/17 1800  vancomycin (VANCOCIN) IVPB 1000 mg/200 mL premix     1,000 mg 200 mL/hr over 60 Minutes Intravenous  Once 11/14/17 1755 11/15/17 0112   11/14/17 1545  cefTRIAXone (ROCEPHIN) 1 g in dextrose 5 % 50 mL IVPB  Status:  Discontinued     1 g 100 mL/hr over 30 Minutes Intravenous  Once 11/14/17 1534 11/14/17 1732   11/14/17 1545  azithromycin (ZITHROMAX) 500 mg in dextrose 5 % 250 mL IVPB     500 mg 250 mL/hr over 60 Minutes Intravenous  Once 11/14/17 1534 11/14/17 1718     Subjective: Pt only minimally responsive this morning.   Objective: Vitals:  11/17/17 1957 11/17/17 2110 11/18/17 0531 11/18/17 0738  BP:  (!) 117/51 121/62   Pulse:  (!) 103 (!) 110   Resp:  17 17   Temp:   97.7 F (36.5 C)   TempSrc:   Axillary   SpO2: 98% 99% 96% 96%  Weight:      Height:        Intake/Output Summary (Last 24 hours) at 11/18/2017 0835 Last data filed at 11/18/2017 1448 Gross per 24 hour  Intake 3669.16 ml  Output 950 ml  Net 2719.16 ml   Filed Weights   11/14/17 1231 11/14/17 2010 11/15/17 0304  Weight: 49.9 kg (110 lb 0.2 oz) 49.8  kg (109 lb 12.6 oz) 49.8 kg (109 lb 12.6 oz)    Examination:  General exam: Awake, unable to assess orientation given nonverbal state, appears chronically ill and cachectic, is contracted. NAD.  Respiratory system: Clear to auscultation. Respiratory effort normal. Cardiovascular system:normal s1, s2 sounds No murmurs, rubs, gallops. Gastrointestinal system: Abdomen is nondistended, soft and nontender. No organomegaly or masses felt. Normal bowel sounds heard. Central nervous system: contracted. Extremities: No C/C/E, +pedal pulses Skin: Sacral decubitus ulcer, likely stage IV Psychiatry: Unable to assess  Data Reviewed: I have personally reviewed following labs and imaging studies  CBC: Recent Labs  Lab 11/14/17 1334 11/15/17 0615 11/17/17 0450 11/18/17 0455  WBC 16.1* 12.4* 11.2* 10.2  NEUTROABS 14.0* 11.0*  --   --   HGB 10.6* 8.6* 8.7* 8.1*  HCT 36.5 29.7* 29.4* 26.0*  MCV 87.7 87.1 87.0 83.9  PLT 275 195 174 185   Basic Metabolic Panel: Recent Labs  Lab 11/14/17 1334 11/14/17 2000 11/15/17 0615 11/17/17 0450 11/18/17 0455  NA 169* 167* 165* 158* 145  K 3.2* 2.9* 3.2* 2.6* 2.8*  CL 129* 129* >130* 130* 120*  CO2 20* 20* 15* 16* 14*  GLUCOSE 154* 135* 102* 125* 118*  BUN 113* 115* 105* 71* 48*  CREATININE 3.94* 3.92* 3.31* 2.48* 1.89*  CALCIUM 8.0* 7.8* 7.5* 8.3* 7.6*  MG  --   --   --  2.1 1.7  PHOS  --   --   --  3.5  --    GFR: Estimated Creatinine Clearance: 19.9 mL/min (A) (by C-G formula based on SCr of 1.89 mg/dL (H)). Liver Function Tests: Recent Labs  Lab 11/14/17 1334  AST 30  ALT 43  ALKPHOS 77  BILITOT 0.4  PROT 7.3  ALBUMIN 2.6*   No results for input(s): LIPASE, AMYLASE in the last 168 hours. No results for input(s): AMMONIA in the last 168 hours. Coagulation Profile: No results for input(s): INR, PROTIME in the last 168 hours. Cardiac Enzymes: No results for input(s): CKTOTAL, CKMB, CKMBINDEX, TROPONINI in the last 168 hours. BNP  (last 3 results) No results for input(s): PROBNP in the last 8760 hours. HbA1C: No results for input(s): HGBA1C in the last 72 hours. CBG: No results for input(s): GLUCAP in the last 168 hours. Lipid Profile: No results for input(s): CHOL, HDL, LDLCALC, TRIG, CHOLHDL, LDLDIRECT in the last 72 hours. Thyroid Function Tests: No results for input(s): TSH, T4TOTAL, FREET4, T3FREE, THYROIDAB in the last 72 hours. Anemia Panel: No results for input(s): VITAMINB12, FOLATE, FERRITIN, TIBC, IRON, RETICCTPCT in the last 72 hours. Urine analysis:    Component Value Date/Time   COLORURINE AMBER (A) 11/14/2017 1305   APPEARANCEUR HAZY (A) 11/14/2017 1305   LABSPEC 1.020 11/14/2017 1305   PHURINE 5.0 11/14/2017 1305   GLUCOSEU NEGATIVE 11/14/2017 1305  HGBUR LARGE (A) 11/14/2017 1305   BILIRUBINUR NEGATIVE 11/14/2017 1305   KETONESUR NEGATIVE 11/14/2017 1305   PROTEINUR NEGATIVE 11/14/2017 1305   UROBILINOGEN 1.0 07/18/2012 1714   NITRITE NEGATIVE 11/14/2017 1305   LEUKOCYTESUR NEGATIVE 11/14/2017 1305    Recent Results (from the past 240 hour(s))  Urine culture     Status: None   Collection Time: 11/14/17  1:05 PM  Result Value Ref Range Status   Specimen Description URINE, CATHETERIZED  Final   Special Requests NONE  Final   Culture   Final    NO GROWTH Performed at [facility], [place] [address], [place], [place] [address]    Report Status 11/15/2017 FINAL  Final  Blood Culture (routine x 2)     Status: None (Preliminary result)   Collection Time: 11/14/17  1:35 PM  Result Value Ref Range Status   Specimen Description BLOOD LEFT ARM  Final   Special Requests   Final    BOTTLES DRAWN AEROBIC AND ANAEROBIC Blood Culture adequate volume   Culture NO GROWTH 4 DAYS  Final   Report Status PENDING  Incomplete  Blood Culture (routine x 2)     Status: None (Preliminary result)   Collection Time: 11/14/17  1:35 PM  Result Value Ref Range Status   Specimen Description BLOOD LEFT  HAND  Final   Special Requests   Final    BOTTLES DRAWN AEROBIC AND ANAEROBIC Blood Culture adequate volume   Culture NO GROWTH 4 DAYS  Final   Report Status PENDING  Incomplete  MRSA PCR Screening     Status: Abnormal   Collection Time: 11/15/17 11:22 AM  Result Value Ref Range Status   MRSA by PCR POSITIVE (A) NEGATIVE Final    Comment:        The GeneXpert MRSA Assay (FDA approved for NASAL specimens only), is one component of a comprehensive MRSA colonization surveillance program. It is not intended to diagnose MRSA infection nor to guide or monitor treatment for MRSA infections. RESULT CALLED TO, READ BACK BY AND VERIFIED WITH: JACKSON @ [place] [phone] BY HENDERSON L    Radiology Studies: No results found.  Scheduled Meds: . apixaban  2.5 mg Oral BID  . chlorhexidine  15 mL Mouth Rinse BID  . Chlorhexidine Gluconate Cloth  6 each Topical Q0600  . feeding supplement (PRO-STAT SUGAR FREE 64)  30 mL Oral BID  . ipratropium-albuterol  3 mL Inhalation QID  . mouth rinse  15 mL Mouth Rinse q12n4p  . mupirocin ointment  1 application Nasal BID  . senna  1 tablet Oral QHS  . sertraline  25 mg Oral Daily  . sodium chloride flush  10-40 mL Intracatheter Q12H   Continuous Infusions: . ceFEPime (MAXIPIME) IV Stopped (11/17/17 1610)  . dextrose 5 % with KCl 20 mEq / L    . potassium chloride 10 mEq (11/18/17 0822)  . vancomycin Stopped (11/17/17 1841)     LOS: 4 days   Time spent: 25 minutes. Greater than 50% of this time was spent in direct contact with the patient coordinating care.  Irwin Brakeman, MD [facility] Pager [phone]  If 7PM-7AM, please contact night-coverage [email or web address] Password [facility] 11/18/2017, 8:34 AM

## 2017-11-19 DIAGNOSIS — J189 Pneumonia, unspecified organism: Secondary | ICD-10-CM | POA: Diagnosis not present

## 2017-11-19 DIAGNOSIS — F0390 Unspecified dementia without behavioral disturbance: Secondary | ICD-10-CM | POA: Diagnosis present

## 2017-11-19 DIAGNOSIS — Z86711 Personal history of pulmonary embolism: Secondary | ICD-10-CM | POA: Diagnosis not present

## 2017-11-19 DIAGNOSIS — E876 Hypokalemia: Secondary | ICD-10-CM | POA: Diagnosis present

## 2017-11-19 DIAGNOSIS — M1990 Unspecified osteoarthritis, unspecified site: Secondary | ICD-10-CM | POA: Diagnosis present

## 2017-11-19 DIAGNOSIS — Z87891 Personal history of nicotine dependence: Secondary | ICD-10-CM

## 2017-11-19 DIAGNOSIS — G8929 Other chronic pain: Secondary | ICD-10-CM | POA: Diagnosis present

## 2017-11-19 DIAGNOSIS — E878 Other disorders of electrolyte and fluid balance, not elsewhere classified: Secondary | ICD-10-CM | POA: Diagnosis present

## 2017-11-19 DIAGNOSIS — Z515 Encounter for palliative care: Principal | ICD-10-CM | POA: Diagnosis present

## 2017-11-19 DIAGNOSIS — Z7901 Long term (current) use of anticoagulants: Secondary | ICD-10-CM | POA: Diagnosis not present

## 2017-11-19 DIAGNOSIS — L89899 Pressure ulcer of other site, unspecified stage: Secondary | ICD-10-CM | POA: Diagnosis present

## 2017-11-19 DIAGNOSIS — R627 Adult failure to thrive: Secondary | ICD-10-CM | POA: Diagnosis present

## 2017-11-19 DIAGNOSIS — Z66 Do not resuscitate: Secondary | ICD-10-CM | POA: Diagnosis not present

## 2017-11-19 DIAGNOSIS — E870 Hyperosmolality and hypernatremia: Secondary | ICD-10-CM | POA: Diagnosis present

## 2017-11-19 DIAGNOSIS — L89154 Pressure ulcer of sacral region, stage 4: Secondary | ICD-10-CM | POA: Diagnosis present

## 2017-11-19 DIAGNOSIS — Z7189 Other specified counseling: Secondary | ICD-10-CM | POA: Diagnosis not present

## 2017-11-19 DIAGNOSIS — Z8614 Personal history of Methicillin resistant Staphylococcus aureus infection: Secondary | ICD-10-CM | POA: Diagnosis not present

## 2017-11-19 DIAGNOSIS — R1310 Dysphagia, unspecified: Secondary | ICD-10-CM | POA: Diagnosis present

## 2017-11-19 DIAGNOSIS — R7989 Other specified abnormal findings of blood chemistry: Secondary | ICD-10-CM | POA: Diagnosis present

## 2017-11-19 DIAGNOSIS — E871 Hypo-osmolality and hyponatremia: Secondary | ICD-10-CM | POA: Diagnosis present

## 2017-11-19 DIAGNOSIS — G894 Chronic pain syndrome: Secondary | ICD-10-CM | POA: Diagnosis not present

## 2017-11-19 DIAGNOSIS — L89324 Pressure ulcer of left buttock, stage 4: Secondary | ICD-10-CM | POA: Diagnosis not present

## 2017-11-19 DIAGNOSIS — E785 Hyperlipidemia, unspecified: Secondary | ICD-10-CM | POA: Diagnosis present

## 2017-11-19 DIAGNOSIS — Z7401 Bed confinement status: Secondary | ICD-10-CM | POA: Diagnosis not present

## 2017-11-19 DIAGNOSIS — N179 Acute kidney failure, unspecified: Secondary | ICD-10-CM | POA: Diagnosis present

## 2017-11-19 DIAGNOSIS — Y95 Nosocomial condition: Secondary | ICD-10-CM | POA: Diagnosis present

## 2017-11-19 DIAGNOSIS — L89894 Pressure ulcer of other site, stage 4: Secondary | ICD-10-CM | POA: Diagnosis not present

## 2017-11-19 DIAGNOSIS — J180 Bronchopneumonia, unspecified organism: Secondary | ICD-10-CM | POA: Diagnosis present

## 2017-11-19 DIAGNOSIS — Z9181 History of falling: Secondary | ICD-10-CM | POA: Diagnosis not present

## 2017-11-19 DIAGNOSIS — E860 Dehydration: Secondary | ICD-10-CM | POA: Diagnosis present

## 2017-11-19 DIAGNOSIS — R634 Abnormal weight loss: Secondary | ICD-10-CM | POA: Diagnosis present

## 2017-11-19 DIAGNOSIS — Z681 Body mass index (BMI) 19 or less, adult: Secondary | ICD-10-CM | POA: Diagnosis not present

## 2017-11-19 DIAGNOSIS — I96 Gangrene, not elsewhere classified: Secondary | ICD-10-CM | POA: Diagnosis present

## 2017-11-19 DIAGNOSIS — F329 Major depressive disorder, single episode, unspecified: Secondary | ICD-10-CM | POA: Diagnosis present

## 2017-11-19 DIAGNOSIS — J181 Lobar pneumonia, unspecified organism: Secondary | ICD-10-CM | POA: Diagnosis present

## 2017-11-19 DIAGNOSIS — M8580 Other specified disorders of bone density and structure, unspecified site: Secondary | ICD-10-CM | POA: Diagnosis present

## 2017-11-19 LAB — CULTURE, BLOOD (ROUTINE X 2)
CULTURE: NO GROWTH
Culture: NO GROWTH
SPECIAL REQUESTS: ADEQUATE
Special Requests: ADEQUATE

## 2017-11-19 MED ORDER — LORAZEPAM 1 MG PO TABS
1.0000 mg | ORAL_TABLET | ORAL | Status: DC | PRN
  Administered 2017-11-19 (×2): 1 mg via ORAL
  Filled 2017-11-19 (×3): qty 1

## 2017-11-19 MED ORDER — IPRATROPIUM-ALBUTEROL 0.5-2.5 (3) MG/3ML IN SOLN
3.0000 mL | Freq: Three times a day (TID) | RESPIRATORY_TRACT | Status: DC
  Administered 2017-11-19 – 2017-11-20 (×2): 3 mL via RESPIRATORY_TRACT
  Filled 2017-11-19 (×3): qty 3

## 2017-11-19 MED ORDER — POTASSIUM CL IN DEXTROSE 5% 20 MEQ/L IV SOLN
20.0000 meq | INTRAVENOUS | Status: DC
  Filled 2017-11-19 (×2): qty 1000

## 2017-11-19 MED ORDER — LORAZEPAM 2 MG/ML PO CONC: 1.0000 mg | ORAL | Status: DC | PRN

## 2017-11-19 NOTE — Care Management Note (Signed)
Case Management Note  Patient Details  Name: Shari Dean MRN: [id number] Date of Birth: Sep 12, 1941  If discussed at Long Length of Stay Meetings, dates discussed:  11/19/17  Additional Comments:  Sherald Barge, RN 11/19/2017, 3:06 PM

## 2017-11-19 NOTE — Progress Notes (Signed)
SLP Cancellation Note  Patient Details Name: Shari Dean MRN: [id number] DOB: 23-May-1941   Cancelled treatment:       Reason Eval/Treat Not Completed: Other (comment); Pt continues to be somnolent and plan is to discharge to hospice of [facility] SLP will sign off. Reconsult if indicated.   Thank you,  Genene Churn, [place]    [place] 11/19/2017, 5:38 PM

## 2017-11-19 NOTE — Progress Notes (Signed)
PROGRESS NOTE    Shari Dean  XVQ:[id number] DOB: 1941/02/15 DOA: 11/14/2017 PCP: Madelyn Brunner, MD     Brief Narrative:  77 year old woman admitted from SNF on 1/26 due to abnormal labs when she was found to have an elevated creatinine.  She has severe dementia and is nonverbal at baseline.  She has been admitted once before for pneumonia and dehydration.  She is found to have pneumonia on chest x-ray as well as evidence of severe dehydration.  Admission requested.  Assessment & Plan:   Principal Problem:   HCAP (healthcare-associated pneumonia) Active Problems:   Dementia   Hypernatremia   AKI (acute kidney injury) ([place])   History of pulmonary embolus (PE)   Dehydration   Pressure injury of skin   Encounter for hospice care discussion  Hospital-acquired pneumonia -De-escalated antibiotics 1/30. -Culture data remains negative to date. -WBC trended down.   Acute renal failure -Likely due to prerenal azotemia and severe dehydration. -Follow with IV fluids. Improving. -Renal ultrasound negative for hydronephrosis or obstruction. -Improved with IVF hydration.  Hypernatremia/hyperchloremia -Secondary to severe dehydration in an elderly person with dementia who is not able to make her thirst needs apparent. -Continue D5W.  -Improved; trending down  Hypokalemia -Replacing IV, give additional magnesium 1/30.   History of PE  Advanced dementia -Per family member she is currently at baseline which is nonverbal, contracted.  Adult failure to thrive -Family/guardian requesting residential hospice placement.  Pressure ulcer of the sacrum, stage IV -Have requested wound care for further recommendations. -Present prior to admission.  Code Status: DNRl  Family Communication: Son and guardian at bedside updated on plan of care. Disposition Plan:  I spoke with legal guardian at the bedside and they requested residential hospice placement.  I spoke with the social  worker and updated her to start making the arrangements.   Palliative care  Procedures:   None  Antimicrobials:  Anti-infectives (From admission, onward)   Start     Dose/Rate Route Frequency Ordered Stop   11/18/17 0845  doxycycline (VIBRAMYCIN) 100 mg in dextrose 5 % 250 mL IVPB     100 mg 125 mL/hr over 120 Minutes Intravenous Every 12 hours 11/18/17 0840     11/15/17 1800  vancomycin (VANCOCIN) 500 mg in sodium chloride 0.9 % 100 mL IVPB  Status:  Discontinued     500 mg 100 mL/hr over 60 Minutes Intravenous Every 24 hours 11/15/17 0853 11/18/17 0840   11/15/17 1330  ceFEPIme (MAXIPIME) 1 g in dextrose 5 % 50 mL IVPB  Status:  Discontinued     1 g 100 mL/hr over 30 Minutes Intravenous Every 24 hours 11/15/17 1125 11/18/17 0840   11/14/17 1800  ceFEPIme (MAXIPIME) 1 g in dextrose 5 % 50 mL IVPB     1 g 100 mL/hr over 30 Minutes Intravenous  Once 11/14/17 1732 11/15/17 0200   11/14/17 1800  vancomycin (VANCOCIN) IVPB 1000 mg/200 mL premix     1,000 mg 200 mL/hr over 60 Minutes Intravenous  Once 11/14/17 1755 11/15/17 0112   11/14/17 1545  cefTRIAXone (ROCEPHIN) 1 g in dextrose 5 % 50 mL IVPB  Status:  Discontinued     1 g 100 mL/hr over 30 Minutes Intravenous  Once 11/14/17 1534 11/14/17 1732   11/14/17 1545  azithromycin (ZITHROMAX) 500 mg in dextrose 5 % 250 mL IVPB     500 mg 250 mL/hr over 60 Minutes Intravenous  Once 11/14/17 1534 11/14/17 1718  Subjective: Pt only minimally responsive but appears comfortable.   Objective: Vitals:   11/18/17 1934 11/18/17 2042 11/19/17 0556 11/19/17 0835  BP:  131/71 (!) 118/93   Pulse:  (!) 103 96   Resp:  20 18   Temp:   98.3 F (36.8 C)   TempSrc:   Axillary   SpO2: 95% 95% 95% 96%  Weight:   55.3 kg (121 lb 14.6 oz)   Height:        Intake/Output Summary (Last 24 hours) at 11/19/2017 1128 Last data filed at 11/19/2017 0556 Gross per 24 hour  Intake 1478.34 ml  Output 800 ml  Net 678.34 ml   Filed Weights    11/14/17 2010 11/15/17 0304 11/19/17 0556  Weight: 49.8 kg (109 lb 12.6 oz) 49.8 kg (109 lb 12.6 oz) 55.3 kg (121 lb 14.6 oz)   Examination:  General exam: Awake, unable to assess orientation given nonverbal state, appears chronically ill and cachectic, is contracted. NAD.  Respiratory system: Clear to auscultation. Respiratory effort normal. Cardiovascular system:normal s1, s2 sounds No murmurs, rubs, gallops. Gastrointestinal system: Abdomen is nondistended, soft and nontender. No organomegaly or masses felt. Normal bowel sounds heard. Central nervous system: contracted. Extremities: No C/C/E, +pedal pulses Skin: Sacral decubitus ulcer, likely stage IV Psychiatry: Unable to assess  Data Reviewed: I have personally reviewed following labs and imaging studies  CBC: Recent Labs  Lab 11/14/17 1334 11/15/17 0615 11/17/17 0450 11/18/17 0455  WBC 16.1* 12.4* 11.2* 10.2  NEUTROABS 14.0* 11.0*  --   --   HGB 10.6* 8.6* 8.7* 8.1*  HCT 36.5 29.7* 29.4* 26.0*  MCV 87.7 87.1 87.0 83.9  PLT 275 195 174 329   Basic Metabolic Panel: Recent Labs  Lab 11/14/17 1334 11/14/17 2000 11/15/17 0615 11/17/17 0450 11/18/17 0455  NA 169* 167* 165* 158* 145  K 3.2* 2.9* 3.2* 2.6* 2.8*  CL 129* 129* >130* 130* 120*  CO2 20* 20* 15* 16* 14*  GLUCOSE 154* 135* 102* 125* 118*  BUN 113* 115* 105* 71* 48*  CREATININE 3.94* 3.92* 3.31* 2.48* 1.89*  CALCIUM 8.0* 7.8* 7.5* 8.3* 7.6*  MG  --   --   --  2.1 1.7  PHOS  --   --   --  3.5  --    GFR: Estimated Creatinine Clearance: 22.1 mL/min (A) (by C-G formula based on SCr of 1.89 mg/dL (H)). Liver Function Tests: Recent Labs  Lab 11/14/17 1334  AST 30  ALT 43  ALKPHOS 77  BILITOT 0.4  PROT 7.3  ALBUMIN 2.6*   No results for input(s): LIPASE, AMYLASE in the last 168 hours. No results for input(s): AMMONIA in the last 168 hours. Coagulation Profile: No results for input(s): INR, PROTIME in the last 168 hours. Cardiac Enzymes: No results  for input(s): CKTOTAL, CKMB, CKMBINDEX, TROPONINI in the last 168 hours. BNP (last 3 results) No results for input(s): PROBNP in the last 8760 hours. HbA1C: No results for input(s): HGBA1C in the last 72 hours. CBG: No results for input(s): GLUCAP in the last 168 hours. Lipid Profile: No results for input(s): CHOL, HDL, LDLCALC, TRIG, CHOLHDL, LDLDIRECT in the last 72 hours. Thyroid Function Tests: No results for input(s): TSH, T4TOTAL, FREET4, T3FREE, THYROIDAB in the last 72 hours. Anemia Panel: No results for input(s): VITAMINB12, FOLATE, FERRITIN, TIBC, IRON, RETICCTPCT in the last 72 hours. Urine analysis:    Component Value Date/Time   COLORURINE AMBER (A) 11/14/2017 1305   APPEARANCEUR HAZY (A) 11/14/2017 1305  LABSPEC 1.020 11/14/2017 1305   PHURINE 5.0 11/14/2017 1305   GLUCOSEU NEGATIVE 11/14/2017 1305   HGBUR LARGE (A) 11/14/2017 1305   BILIRUBINUR NEGATIVE 11/14/2017 1305   KETONESUR NEGATIVE 11/14/2017 1305   PROTEINUR NEGATIVE 11/14/2017 1305   UROBILINOGEN 1.0 07/18/2012 1714   NITRITE NEGATIVE 11/14/2017 1305   LEUKOCYTESUR NEGATIVE 11/14/2017 1305    Recent Results (from the past 240 hour(s))  Urine culture     Status: None   Collection Time: 11/14/17  1:05 PM  Result Value Ref Range Status   Specimen Description URINE, CATHETERIZED  Final   Special Requests NONE  Final   Culture   Final    NO GROWTH Performed at [facility], [place] [address], [place], [place] [address]    Report Status 11/15/2017 FINAL  Final  Blood Culture (routine x 2)     Status: None   Collection Time: 11/14/17  1:35 PM  Result Value Ref Range Status   Specimen Description BLOOD LEFT ARM  Final   Special Requests   Final    BOTTLES DRAWN AEROBIC AND ANAEROBIC Blood Culture adequate volume   Culture NO GROWTH 5 DAYS  Final   Report Status 11/19/2017 FINAL  Final  Blood Culture (routine x 2)     Status: None   Collection Time: 11/14/17  1:35 PM  Result Value Ref Range  Status   Specimen Description BLOOD LEFT HAND  Final   Special Requests   Final    BOTTLES DRAWN AEROBIC AND ANAEROBIC Blood Culture adequate volume   Culture NO GROWTH 5 DAYS  Final   Report Status 11/19/2017 FINAL  Final  MRSA PCR Screening     Status: Abnormal   Collection Time: 11/15/17 11:22 AM  Result Value Ref Range Status   MRSA by PCR POSITIVE (A) NEGATIVE Final    Comment:        The GeneXpert MRSA Assay (FDA approved for NASAL specimens only), is one component of a comprehensive MRSA colonization surveillance program. It is not intended to diagnose MRSA infection nor to guide or monitor treatment for MRSA infections. RESULT CALLED TO, READ BACK BY AND VERIFIED WITH: JACKSON @ [place] [id number] BY HENDERSON L    Radiology Studies: No results found.  Scheduled Meds: . apixaban  5 mg Oral BID  . chlorhexidine  15 mL Mouth Rinse BID  . Chlorhexidine Gluconate Cloth  6 each Topical Q0600  . feeding supplement (PRO-STAT SUGAR FREE 64)  30 mL Oral BID  . ipratropium-albuterol  3 mL Inhalation TID  . mouth rinse  15 mL Mouth Rinse q12n4p  . mupirocin ointment  1 application Nasal BID  . senna  1 tablet Oral QHS  . sertraline  25 mg Oral Daily  . sodium chloride flush  10-40 mL Intracatheter Q12H   Continuous Infusions: . dextrose 5 % with KCl 20 mEq / L 20 mEq (11/19/17 0920)  . doxycycline (VIBRAMYCIN) IV 100 mg (11/19/17 0920)     LOS: 5 days   Time spent: 25 minutes. Greater than 50% of this time was spent in direct contact with the patient coordinating care.  Irwin Brakeman, MD [facility] Pager [phone]  If 7PM-7AM, please contact night-coverage [email or web address] Password [facility] 11/19/2017, 11:28 AM

## 2017-11-19 NOTE — Progress Notes (Signed)
Daily Progress Note   Patient Name: Shari Dean       Date: 11/19/2017 DOB: [birth year]  Age: 77 y.o. MRN#: [id number] Attending Physician: Murlean Iba, MD Primary Care Physician: Madelyn Brunner, MD Admit Date: 11/14/2017  Reason for Consultation/Follow-up: Establishing goals of care, Psychosocial/spiritual support and Terminal Care  Subjective: Shari Dean is resting quietly in bed.  Shari Dean and Shari Dean are present at bedside.  Rush Dean states that his mother vomited earlier in the day.  We talked about symptom management.  We talked about pain management and anxiety management and medications for nausea and vomiting.  Rush Dean states that his goal has changed, he would like transfer to [facility] home rather than staying in the hospital as long as possible.  He has been to the hospice home and states that he is pleased with their facility.  Length of Stay: 5  Current Medications: Scheduled Meds:  . chlorhexidine  15 mL Mouth Rinse BID  . Chlorhexidine Gluconate Cloth  6 each Topical Q0600  . feeding supplement (PRO-STAT SUGAR FREE 64)  30 mL Oral BID  . ipratropium-albuterol  3 mL Inhalation TID  . mouth rinse  15 mL Mouth Rinse q12n4p  . mupirocin ointment  1 application Nasal BID  . sodium chloride flush  10-40 mL Intracatheter Q12H    Continuous Infusions:   PRN Meds: LORazepam, morphine CONCENTRATE, promethazine, sodium chloride flush  Physical Exam  Constitutional: No distress.  Appears acutely/chronically ill, calm comfortable  HENT:  Head: Atraumatic.  Temporal wasting  Cardiovascular: Normal rate.  Pulmonary/Chest: Effort normal. No respiratory distress.  Abdominal: Soft. She exhibits no distension.  Musculoskeletal: She exhibits  deformity. She exhibits no edema.  Contracted  Neurological:  Does not open eyes as we are speaking, known end-stage dementia  Skin: Skin is warm and dry.  Wounds as noted by nursing  Nursing note and vitals reviewed.           Vital Signs: BP (!) 118/93 (BP Location: Left Arm)   Pulse 96   Temp 98.3 F (36.8 C) (Axillary)   Resp 18   Ht 5\' 7"  (1.702 m)   Wt 55.3 kg (121 lb 14.6 oz)   SpO2 96%   BMI 19.09 kg/m  SpO2: SpO2: 96 % O2 Device: O2 Device:  Nasal Cannula O2 Flow Rate: O2 Flow Rate (L/min): 2 L/min  Intake/output summary:   Intake/Output Summary (Last 24 hours) at 11/19/2017 1454 Last data filed at 11/19/2017 0556 Gross per 24 hour  Intake 1378.34 ml  Output 800 ml  Net 578.34 ml   LBM: Last BM Date: 11/18/17 Baseline Weight: Weight: 49.9 kg (110 lb 0.2 oz) Most recent weight: Weight: 55.3 kg (121 lb 14.6 oz)       Palliative Assessment/Data:    Flowsheet Rows     Most Recent Value  Intake Tab  Referral Department  Hospitalist  Unit at Time of Referral  Med/Surg Unit  Date Notified  11/15/17  Palliative Care Type  New Palliative care  Reason for referral  Clarify Goals of Care, Counsel Regarding Hospice  Date of Admission  11/14/17  Date first seen by Palliative Care  11/16/17  # of days Palliative referral response time  1 Day(s)  # of days IP prior to Palliative referral  1  Clinical Assessment  Palliative Performance Scale Score  30%  Pain Max last 24 hours  Not able to report  Pain Min Last 24 hours  Not able to report  Dyspnea Max Last 24 Hours  Not able to report  Dyspnea Min Last 24 hours  Not able to report  Psychosocial & Spiritual Assessment  Palliative Care Outcomes  Patient/Family meeting held?  Yes      Patient Active Problem List   Diagnosis Date Noted  . Pressure injury of skin 11/18/2017  . Encounter for hospice care discussion   . HCAP (healthcare-associated pneumonia) 11/14/2017  . Dehydration 11/14/2017  . Decubitus ulcer  of left foot 09/23/2017  . Decubitus ulcer of ischium, left, stage IV ([place]) 09/23/2017  . History of pulmonary embolus (PE) 09/23/2017  . Palliative care encounter   . Goals of care, counseling/discussion   . DNR (do not resuscitate) discussion   . AKI (acute kidney injury) ([place])   . Hypernatremia   . Dementia 07/17/2012  . Osteoarthritis 07/17/2012    Palliative Care Assessment & Plan   Patient Profile: 77 y.o.femalewith past medical history of advanced dementia, bedbound, nonverbal, SNF resident,admitted on 1/26/2019with dehydration, healthcare acquired pneumonia.  Assessment: Pneumonia;clinically improving, remains frail and likely to decline. End-stage dementia; bedbound, contracted, nonverbal. Experiencing failure to thrive due to poor PO intake. Weight loss.  Recommendations/Plan:  Transfer to hospice home of [facility] for comfort and dignity at end of life.  No further IV antibiotics or IV fluids.   Goals of Care and Additional Recommendations:  Limitations on Scope of Treatment: Full Comfort Care  Code Status:    Code Status Orders  (From admission, onward)        Start     Ordered   11/17/17 1523  Do not attempt resuscitation (DNR)  Continuous    Question Answer Comment  In the event of cardiac or respiratory ARREST Do not call a "code blue"   In the event of cardiac or respiratory ARREST Do not perform Intubation, CPR, defibrillation or ACLS   In the event of cardiac or respiratory ARREST Use medication by any route, position, wound care, and other measures to relive pain and suffering. May use oxygen, suction and manual treatment of airway obstruction as needed for comfort.      11/17/17 1522    Code Status History    Date Active Date Inactive Code Status Order ID Comments User Context   11/14/2017 17:32 11/17/2017 15:22 Full Code [id number]  Phillips Grout, MD ED   11/11/2016 05:27 11/12/2016 18:29 Full Code [id number]  Rubbie Battiest, RN  Inpatient   11/10/2016 15:52 11/11/2016 05:27 DNR [id number]  Drue Novel, NP Inpatient   11/08/2016 13:40 11/10/2016 15:52 Full Code [id number]  Rosita Fire, MD ED   07/18/2012 00:38 07/19/2012 19:10 Full Code [id number]  Noreene Larsson, RN Inpatient    Advance Directive Documentation     Most Recent Value  Type of Advance Directive  Out of facility DNR (pink MOST or yellow form)  Pre-existing out of facility DNR order (yellow form or pink MOST form)  No data  "MOST" Form in Place?  No data       Prognosis:   < 4 weeks, 2 weeks or less would not be surprising.  Discharge Planning:  .Family is requesting comfort and dignity at end of life, residential hospice with [facility].  Care plan was discussed with nursing staff, case management, social worker, and Dr. Wynetta Emery.  Thank you for allowing the Palliative Medicine Team to assist in the care of this patient.   Time In: 1430 Time Out: 1450 Total Time 20 minutes Prolonged Time Billed  no       Greater than 50%  of this time was spent counseling and coordinating care related to the above assessment and plan.  Drue Novel, NP  Please contact Palliative Medicine Team phone at [phone] for questions and concerns.

## 2017-11-20 ENCOUNTER — Inpatient Hospital Stay (HOSPITAL_COMMUNITY)
Admission: RE | Admit: 2017-11-20 | Discharge: 2017-11-21 | DRG: 951 | Disposition: A | Discharge status: Hospice | Source: Ambulatory Visit | Attending: Family Medicine | Admitting: Family Medicine

## 2017-11-20 ENCOUNTER — Encounter (HOSPITAL_COMMUNITY): Payer: Self-pay | Admitting: Family Medicine

## 2017-11-20 DIAGNOSIS — F0390 Unspecified dementia without behavioral disturbance: Secondary | ICD-10-CM | POA: Diagnosis present

## 2017-11-20 DIAGNOSIS — Z515 Encounter for palliative care: Secondary | ICD-10-CM

## 2017-11-20 DIAGNOSIS — G8929 Other chronic pain: Secondary | ICD-10-CM | POA: Diagnosis present

## 2017-11-20 DIAGNOSIS — E860 Dehydration: Secondary | ICD-10-CM | POA: Diagnosis present

## 2017-11-20 DIAGNOSIS — L89324 Pressure ulcer of left buttock, stage 4: Secondary | ICD-10-CM | POA: Diagnosis present

## 2017-11-20 DIAGNOSIS — Z66 Do not resuscitate: Secondary | ICD-10-CM | POA: Diagnosis present

## 2017-11-20 DIAGNOSIS — G894 Chronic pain syndrome: Secondary | ICD-10-CM

## 2017-11-20 DIAGNOSIS — L89894 Pressure ulcer of other site, stage 4: Secondary | ICD-10-CM

## 2017-11-20 DIAGNOSIS — L89899 Pressure ulcer of other site, unspecified stage: Secondary | ICD-10-CM | POA: Diagnosis present

## 2017-11-20 MED ORDER — POLYVINYL ALCOHOL 1.4 % OP SOLN: 1.0000 [drp] | Freq: Four times a day (QID) | OPHTHALMIC | Status: DC | PRN

## 2017-11-20 MED ORDER — ACETAMINOPHEN 650 MG RE SUPP: 650.0000 mg | Freq: Four times a day (QID) | RECTAL | Status: DC | PRN

## 2017-11-20 MED ORDER — MORPHINE SULFATE (CONCENTRATE) 10 MG/0.5ML PO SOLN: 5.0000 mg | ORAL | Status: DC | PRN

## 2017-11-20 MED ORDER — BISACODYL 10 MG RE SUPP: 10.0000 mg | Freq: Every day | RECTAL | Status: DC | PRN

## 2017-11-20 MED ORDER — ACETAMINOPHEN 325 MG PO TABS: 650.0000 mg | ORAL_TABLET | Freq: Four times a day (QID) | ORAL | Status: DC | PRN

## 2017-11-20 MED ORDER — GLYCOPYRROLATE 0.2 MG/ML IJ SOLN: 0.2000 mg | INTRAMUSCULAR | Status: DC | PRN

## 2017-11-20 MED ORDER — LORAZEPAM 1 MG PO TABS: 1.0000 mg | ORAL_TABLET | ORAL | Status: DC | PRN

## 2017-11-20 MED ORDER — LORAZEPAM 2 MG/ML PO CONC: 1.0000 mg | ORAL | Status: DC | PRN

## 2017-11-20 MED ORDER — GLYCOPYRROLATE 1 MG PO TABS: 1.0000 mg | ORAL_TABLET | ORAL | Status: DC | PRN

## 2017-11-20 MED ORDER — ONDANSETRON HCL 4 MG/2ML IJ SOLN: 4.0000 mg | Freq: Four times a day (QID) | INTRAMUSCULAR | Status: DC | PRN

## 2017-11-20 MED ORDER — IPRATROPIUM-ALBUTEROL 0.5-2.5 (3) MG/3ML IN SOLN: 3.0000 mL | Freq: Three times a day (TID) | RESPIRATORY_TRACT | Status: DC

## 2017-11-20 MED ORDER — LORAZEPAM 2 MG/ML IJ SOLN: 1.0000 mg | INTRAMUSCULAR | Status: DC | PRN

## 2017-11-20 MED ORDER — IPRATROPIUM-ALBUTEROL 0.5-2.5 (3) MG/3ML IN SOLN: 3.0000 mL | Freq: Four times a day (QID) | RESPIRATORY_TRACT | Status: DC

## 2017-11-20 MED ORDER — ONDANSETRON 4 MG PO TBDP: 4.0000 mg | ORAL_TABLET | Freq: Four times a day (QID) | ORAL | Status: DC | PRN

## 2017-11-20 NOTE — Discharge Summary (Signed)
Physician Discharge Summary  MAGAN WINNETT LPF:[id number] DOB: 1940-11-15 DOA: 11/14/2017  PCP: Madelyn Brunner, MD  Admit date: 11/14/2017 Discharge date: 11/20/2017  Disposition: Hospice  Discharge Condition: Hospice  CODE STATUS: DNR   Brief Hospitalization Summary: Please see all hospital notes, images, labs for full details of the hospitalization.  Brief Narrative:  77 year old woman admitted from SNF on 1/26 due to abnormal labs when she was found to have an elevated creatinine.  She has severe dementia and is nonverbal at baseline.  She has been admitted once before for pneumonia and dehydration.  She is found to have pneumonia on chest x-ray as well as evidence of severe dehydration.  Admission requested.  Assessment & Plan:   Principal Problem:   HCAP (healthcare-associated pneumonia) Active Problems:   Dementia   Hypernatremia   AKI (acute kidney injury)    History of pulmonary embolus (PE)   Dehydration   Pressure injury of skin   Encounter for hospice care discussion  Hospital-acquired pneumonia -De-escalated antibiotics 1/30. -Culture data remains negative to date. -WBC trended down.   Acute renal failure -Likely due to prerenal azotemia and severe dehydration. -Follow with IV fluids. Improving. -Renal ultrasound negative for hydronephrosis or obstruction. -Improved with IVF hydration.  Hypernatremia/hyperchloremia -Secondary to severe dehydration in an elderly person with dementia who is not able to make her thirst needs apparent. -Continue D5W.  -Improved; trending down  Hypokalemia -Replaced IV, gave additional magnesium 1/30.   History of PE  Advanced dementia -Per family member she is currently at baseline which is nonverbal, contracted.  Adult failure to thrive -Family/guardian requesting residential hospice placement.  Pressure ulcer of the sacrum, stage IV -Have requested wound care for further recommendations. -Present  prior to admission.  Code Status: DNRl  Family Communication: Son and guardian at bedside updated on plan of care. Disposition Plan:  I spoke with legal guardian at the bedside and they requested residential hospice placement.  I spoke with the social worker and updated her to start making the arrangements.   Palliative care  Discharge Diagnoses:  Principal Problem:   HCAP (healthcare-associated pneumonia) Active Problems:   Dementia   Hypernatremia   AKI (acute kidney injury) ([place])   History of pulmonary embolus (PE)   Dehydration   Pressure injury of skin   Encounter for hospice care discussion  Procedures/Studies: US Renal  Result Date: 11/15/2017 CLINICAL DATA:  Acute renal failure. EXAM: RENAL / URINARY TRACT ULTRASOUND COMPLETE COMPARISON:  None. FINDINGS: Right Kidney: Length: 8.8 cm. Echogenicity within normal limits. No mass or hydronephrosis visualized. Left Kidney: Length: 10.0 cm. Echogenicity within normal limits. No mass or hydronephrosis visualized. Bladder: Appears normal for degree of bladder distention. IMPRESSION: Normal appearance of both kidneys.  No evidence of hydronephrosis. Electronically Signed   By: Earle Gell M.D.   On: 11/15/2017 16:48   Dg Chest Port 1 View  Result Date: 11/14/2017 CLINICAL DATA:  Altered mental status. Worsening level of consciousness. EXAM: PORTABLE CHEST 1 VIEW COMPARISON:  11/08/2016.  Chest CT 05/04/2017 FINDINGS: Heart size is normal. Mediastinal shadows are normal. Right arm PICC tip in the SVC 2 cm above the right atrium. Patchy bilateral bronchopneumonia, most pronounced in the left lower lobe. No dense consolidation or lobar collapse. No effusions. Small segment of embolized wire in the left lower lobe pulmonary arterial tree is unchanged from previous studies as distant as 11/08/2016. IMPRESSION: Patchy bilateral bronchopneumonia, most pronounced in the left lower lobe. Electronically Signed   By:  Nelson Chimes M.D.   On:  11/14/2017 13:46      Subjective: Pt appears comfortable  Discharge Exam: Vitals:   11/19/17 2151 11/20/17 0737  BP:    Pulse:    Resp:    Temp:    SpO2: 96% 96%   Vitals:   11/19/17 0556 11/19/17 0835 11/19/17 2151 11/20/17 0737  BP: (!) 118/93     Pulse: 96     Resp: 18     Temp: 98.3 F (36.8 C)     TempSrc: Axillary     SpO2: 95% 96% 96% 96%  Weight: 55.3 kg (121 lb 14.6 oz)     Height:       General exam: Awake, unable to assess orientation given nonverbal state, appears chronically ill and cachectic, is contracted. NAD.  Respiratory system: Clear to auscultation. Respiratory effort normal. Cardiovascular system:normal s1, s2 sounds No murmurs, rubs, gallops. Gastrointestinal system: Abdomen is nondistended, soft and nontender. No organomegaly or masses felt. Normal bowel sounds heard. Central nervous system: contracted. Extremities: No C/C/E, +pedal pulses Skin: Sacral decubitus ulcer, likely stage IV Psychiatry: Unable to assess   The results of significant diagnostics from this hospitalization (including imaging, microbiology, ancillary and laboratory) are listed below for reference.     Microbiology: Recent Results (from the past 240 hour(s))  Urine culture     Status: None   Collection Time: 11/14/17  1:05 PM  Result Value Ref Range Status   Specimen Description URINE, CATHETERIZED  Final   Special Requests NONE  Final   Culture   Final    NO GROWTH Performed at [facility], [address] [place], [place] [address]    Report Status 11/15/2017 FINAL  Final  Blood Culture (routine x 2)     Status: None   Collection Time: 11/14/17  1:35 PM  Result Value Ref Range Status   Specimen Description BLOOD LEFT ARM  Final   Special Requests   Final    BOTTLES DRAWN AEROBIC AND ANAEROBIC Blood Culture adequate volume   Culture NO GROWTH 5 DAYS  Final   Report Status 11/19/2017 FINAL  Final  Blood Culture (routine x 2)     Status: None   Collection  Time: 11/14/17  1:35 PM  Result Value Ref Range Status   Specimen Description BLOOD LEFT HAND  Final   Special Requests   Final    BOTTLES DRAWN AEROBIC AND ANAEROBIC Blood Culture adequate volume   Culture NO GROWTH 5 DAYS  Final   Report Status 11/19/2017 FINAL  Final  MRSA PCR Screening     Status: Abnormal   Collection Time: 11/15/17 11:22 AM  Result Value Ref Range Status   MRSA by PCR POSITIVE (A) NEGATIVE Final    Comment:        The GeneXpert MRSA Assay (FDA approved for NASAL specimens only), is one component of a comprehensive MRSA colonization surveillance program. It is not intended to diagnose MRSA infection nor to guide or monitor treatment for MRSA infections. RESULT CALLED TO, READ BACK BY AND VERIFIED WITH: JACKSON @ [phone] BY HENDERSON L      Labs: BNP (last 3 results) No results for input(s): BNP in the last 8760 hours. Basic Metabolic Panel: Recent Labs  Lab 11/14/17 1334 11/14/17 2000 11/15/17 0615 11/17/17 0450 11/18/17 0455  NA 169* 167* 165* 158* 145  K 3.2* 2.9* 3.2* 2.6* 2.8*  CL 129* 129* >130* 130* 120*  CO2 20* 20* 15*  16* 14*  GLUCOSE 154* 135* 102* 125* 118*  BUN 113* 115* 105* 71* 48*  CREATININE 3.94* 3.92* 3.31* 2.48* 1.89*  CALCIUM 8.0* 7.8* 7.5* 8.3* 7.6*  MG  --   --   --  2.1 1.7  PHOS  --   --   --  3.5  --    Liver Function Tests: Recent Labs  Lab 11/14/17 1334  AST 30  ALT 43  ALKPHOS 77  BILITOT 0.4  PROT 7.3  ALBUMIN 2.6*   No results for input(s): LIPASE, AMYLASE in the last 168 hours. No results for input(s): AMMONIA in the last 168 hours. CBC: Recent Labs  Lab 11/14/17 1334 11/15/17 0615 11/17/17 0450 11/18/17 0455  WBC 16.1* 12.4* 11.2* 10.2  NEUTROABS 14.0* 11.0*  --   --   HGB 10.6* 8.6* 8.7* 8.1*  HCT 36.5 29.7* 29.4* 26.0*  MCV 87.7 87.1 87.0 83.9  PLT 275 195 174 185   Cardiac Enzymes: No results for input(s): CKTOTAL, CKMB, CKMBINDEX, TROPONINI in the last 168 hours. BNP: Invalid  input(s): POCBNP CBG: No results for input(s): GLUCAP in the last 168 hours. D-Dimer No results for input(s): DDIMER in the last 72 hours. Hgb A1c No results for input(s): HGBA1C in the last 72 hours. Lipid Profile No results for input(s): CHOL, HDL, LDLCALC, TRIG, CHOLHDL, LDLDIRECT in the last 72 hours. Thyroid function studies No results for input(s): TSH, T4TOTAL, T3FREE, THYROIDAB in the last 72 hours.  Invalid input(s): FREET3 Anemia work up No results for input(s): VITAMINB12, FOLATE, FERRITIN, TIBC, IRON, RETICCTPCT in the last 72 hours. Urinalysis    Component Value Date/Time   COLORURINE AMBER (A) 11/14/2017 1305   APPEARANCEUR HAZY (A) 11/14/2017 1305   LABSPEC 1.020 11/14/2017 1305   PHURINE 5.0 11/14/2017 1305   GLUCOSEU NEGATIVE 11/14/2017 1305   HGBUR LARGE (A) 11/14/2017 1305   BILIRUBINUR NEGATIVE 11/14/2017 1305   KETONESUR NEGATIVE 11/14/2017 1305   PROTEINUR NEGATIVE 11/14/2017 1305   UROBILINOGEN 1.0 07/18/2012 1714   NITRITE NEGATIVE 11/14/2017 1305   LEUKOCYTESUR NEGATIVE 11/14/2017 1305   Sepsis Labs Invalid input(s): PROCALCITONIN,  WBC,  LACTICIDVEN Microbiology Recent Results (from the past 240 hour(s))  Urine culture     Status: None   Collection Time: 11/14/17  1:05 PM  Result Value Ref Range Status   Specimen Description URINE, CATHETERIZED  Final   Special Requests NONE  Final   Culture   Final    NO GROWTH Performed at [facility], [place] [address] [place], [place] [address]    Report Status 11/15/2017 FINAL  Final  Blood Culture (routine x 2)     Status: None   Collection Time: 11/14/17  1:35 PM  Result Value Ref Range Status   Specimen Description BLOOD LEFT ARM  Final   Special Requests   Final    BOTTLES DRAWN AEROBIC AND ANAEROBIC Blood Culture adequate volume   Culture NO GROWTH 5 DAYS  Final   Report Status 11/19/2017 FINAL  Final  Blood Culture (routine x 2)     Status: None   Collection Time: 11/14/17  1:35 PM   Result Value Ref Range Status   Specimen Description BLOOD LEFT HAND  Final   Special Requests   Final    BOTTLES DRAWN AEROBIC AND ANAEROBIC Blood Culture adequate volume   Culture NO GROWTH 5 DAYS  Final   Report Status 11/19/2017 FINAL  Final  MRSA PCR Screening     Status: Abnormal  Collection Time: 11/15/17 11:22 AM  Result Value Ref Range Status   MRSA by PCR POSITIVE (A) NEGATIVE Final    Comment:        The GeneXpert MRSA Assay (FDA approved for NASAL specimens only), is one component of a comprehensive MRSA colonization surveillance program. It is not intended to diagnose MRSA infection nor to guide or monitor treatment for MRSA infections. RESULT CALLED TO, READ BACK BY AND VERIFIED WITH: JACKSON @ [phone] BY HENDERSON L    Time coordinating discharge:   SIGNED:  Irwin Brakeman, MD  [facility] 11/20/2017, 1:11 PM Pager [phone]  If 7PM-7AM, please contact night-coverage [email or web address] Password [email or web address]

## 2017-11-20 NOTE — H&P (Signed)
History and Physical  DYNVER CLEMSON KPV:[id number] DOB: 1941/01/18 DOA: 11/20/2017  PCP: Madelyn Brunner, MD   HPI: Shari Dean is a 77 y.o. female with medical history significant of Advanced dementia, chronic bedbound state with chronic wounds, recent infected ulcer to her foot treated with Vanco and Zosyn via PICC line.  This PICC line was removed about a week ago.  Yesterday she was diagnosed with pneumonia again in the nursing home and another PICC line was placed today.  She was started on Rocephin and Levaquin.  She had abnormal labs that were drawn yesterday and was therefore sent to the emergency department for renal failure and the need for IV fluids.  She is found to have pneumonia on infiltrate.  Patient cannot provide any history she is nonverbal.  No family is present.  She has been given IV Rocephin and azithromycin in the ED for coverage for community-acquired pneumonia.  Patient is referred for admission for acute kidney injury with pneumonia and hypernatremia and dehydration.    Pt started treatment in hospital, however, had no meaningful improvement, family met with palliative medicine service and decided to pursue full comfort care measures.    Review of Systems: UTO as patient unable to answer questions.  Past Medical History:  Diagnosis Date  . Chronic cystitis   . Chronic cystitis with hematuria   . Chronic pain   . Dementia   . Depression   . Dysphagia   . Epigastric pain   . Hx MRSA infection   . Hyperlipidemia   . Hyperlipidemia   . Impaired cognition   . Incontinence   . 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 Norovirus   . Osteoarthritis   . Osteoarthritis   . Osteopenia   . Pressure ulcer of unspecified site, stage 4 ([place])   . Renal disorder    kidney failure   History reviewed. No pertinent surgical history. Social History:  reports that she has quit smoking. she has never used smokeless tobacco. She  reports that she does not drink alcohol or use drugs.  No Known Allergies  Family History  Problem Relation Age of Onset  . Diabetes type II Unknown   . Multiple sclerosis Unknown   . Dementia Unknown     Prior to Admission medications   Medication Sig Start Date End Date Taking? Authorizing Provider  acetaminophen (TYLENOL) 325 MG tablet Take 650 mg by mouth every 6 (six) hours as needed. For pain    [provider]  Amino Acids-Protein Hydrolys (FEEDING SUPPLEMENT, PRO-STAT SUGAR FREE 64,) LIQD Take 30 mLs by mouth 2 (two) times daily.    [provider]  cefTRIAXone (ROCEPHIN) 20 MG/ML IVPB Inject 1 g into the vein daily. 11/13/17   [provider]  ELIQUIS 5 MG TABS tablet Take 1 tablet by mouth 2 (two) times daily. 10/26/17   [provider]  ipratropium-albuterol (DUONEB) 0.5-2.5 (3) MG/3ML SOLN Inhale 3 mLs into the lungs 4 (four) times daily. 11/13/17   [provider]  levofloxacin (LEVAQUIN) 500 MG/100ML SOLN Inject 500 mg into the vein once. 11/13/17   [provider]  Nutritional Supplements (NUTRITIONAL DRINK PO) Take 4 oz by mouth 2 (two) times daily.    [provider]  promethazine (PHENERGAN) 25 MG/ML injection Inject 25 mg into the vein every 6 (six) hours as needed for nausea or vomiting.    [provider]  senna (SENOKOT) 8.6 MG TABS Take 1 tablet by mouth at bedtime.     [provider]  sertraline (ZOLOFT) 25 MG tablet Take 25 mg by mouth daily.    [provider]  sodium chloride 0.45 % solution Inject 1,000 mLs into the vein daily. Use 1000 ml intravenously one time a day for PNU rate of 72m/hr for total of 2 liter. 11/13/17   [provider]   Physical Exam: There were no vitals filed for this visit. General exam:Awake, unable to assess orientation given nonverbal state, appears chronically ill and cachectic, is contracted. NAD.  Respiratory system: Clear to  auscultation. Respiratory effort normal. Cardiovascular system:normal s1, s2 sounds No murmurs, rubs, gallops. Gastrointestinal system:Abdomen is nondistended, soft and nontender. No organomegaly or masses felt. Normal bowel sounds heard. Central nervous system:contracted. Extremities: No C/C/E, +pedal pulses Skin: Sacral decubitus ulcer, likely stage IV Psychiatry:Unable to assess  Labs on Admission:  Basic Metabolic Panel: Recent Labs  Lab 11/14/17 1334 11/14/17 2000 11/15/17 0615 11/17/17 0450 11/18/17 0455  NA 169* 167* 165* 158* 145  K 3.2* 2.9* 3.2* 2.6* 2.8*  CL 129* 129* >130* 130* 120*  CO2 20* 20* 15* 16* 14*  GLUCOSE 154* 135* 102* 125* 118*  BUN 113* 115* 105* 71* 48*  CREATININE 3.94* 3.92* 3.31* 2.48* 1.89*  CALCIUM 8.0* 7.8* 7.5* 8.3* 7.6*  MG  --   --   --  2.1 1.7  PHOS  --   --   --  3.5  --    Liver Function Tests: Recent Labs  Lab 11/14/17 1334  AST 30  ALT 43  ALKPHOS 77  BILITOT 0.4  PROT 7.3  ALBUMIN 2.6*   No results for input(s): LIPASE, AMYLASE in the last 168 hours. No results for input(s): AMMONIA in the last 168 hours. CBC: Recent Labs  Lab 11/14/17 1334 11/15/17 0615 11/17/17 0450 11/18/17 0455  WBC 16.1* 12.4* 11.2* 10.2  NEUTROABS 14.0* 11.0*  --   --   HGB 10.6* 8.6* 8.7* 8.1*  HCT 36.5 29.7* 29.4* 26.0*  MCV 87.7 87.1 87.0 83.9  PLT 275 195 174 185   Cardiac Enzymes: No results for input(s): CKTOTAL, CKMB, CKMBINDEX, TROPONINI in the last 168 hours.  BNP (last 3 results) No results for input(s): PROBNP in the last 8760 hours. CBG: No results for input(s): GLUCAP in the last 168 hours.  Radiological Exams on Admission: No results found.  EKG: Independently reviewed.   Assessment/Plan Active Problems:   Dementia   Decubitus ulcer of left foot   Decubitus ulcer of ischium, left, stage IV (HCC)   Dehydration   Chronic pain   DNR (do not resuscitate)   Comfort measures only status   1. Pt admitted for  comfort care measures only.  Pt is DNR.  Please see orders.    CIrwin Brakeman MD [facility] Pager [phone] If 7PM-7AM, please contact night-coverage [email or web address] Password [email or web address] 11/20/2017, 1:59 PM

## 2017-11-21 DIAGNOSIS — L89324 Pressure ulcer of left buttock, stage 4: Secondary | ICD-10-CM

## 2017-11-21 MED ORDER — IPRATROPIUM-ALBUTEROL 0.5-2.5 (3) MG/3ML IN SOLN
3.0000 mL | Freq: Two times a day (BID) | RESPIRATORY_TRACT | Status: DC
  Filled 2017-11-21: qty 3

## 2017-11-21 NOTE — Discharge Summary (Signed)
Physician Discharge Summary  Shari Dean RKY:[id number] DOB: 1940-11-23 DOA: 11/20/2017  PCP: Madelyn Brunner, MD  Admit date: 11/20/2017 Discharge date: 11/21/2017   Disposition: Residential Hospice   Discharge Condition: HOSPICE  CODE STATUS: DNR   Symptom management per hospice protocol  Brief Hospitalization Summary: Please see all hospital notes, images, labs for full details of the hospitalization.  HPI: Shari Dean is a 77 y.o. female with medical history significant ofAdvanced dementia, chronic bedbound state with chronic wounds, recent infected ulcer to her foot treated with Vanco and Zosyn via PICC line. This PICC line was removed about a week ago. Yesterday she was diagnosed with pneumonia again in the nursing home and another PICC line was placed today. She was started on Rocephin and Levaquin. She had abnormal labs that were drawn yesterday and was therefore sent to the emergency department for renal failure and the need for IV fluids. She is found to have pneumonia on infiltrate. Patient cannot provide any history she is nonverbal. No family is present. She has been given IV Rocephin and azithromycin in the ED for coverage for community-acquired pneumonia. Patient is referred for admission for acute kidney injury with pneumonia and hypernatremia and dehydration.    Pt started treatment in hospital, however, had no meaningful improvement, family met with palliative medicine service and decided to pursue full comfort care measures.    Hospital-acquired pneumonia -De-escalatedantibiotics 1/30. -Culture data remains negative to date. -WBC trendeddown.   Acute renal failure -Likely due to prerenal azotemia and severe dehydration. -Follow with IV fluids. Improving. -Renal ultrasound negative for hydronephrosis or obstruction. -Improvedwith IVF hydration.  Hypernatremia/hyperchloremia -Secondary to severe dehydration in an elderly person with  dementia who is not able to make her thirst needs apparent. -Continue D5W.  -Improved; trending down  Hypokalemia -Replaced IV, gave additional magnesium 1/30.  History of PE  Advanced dementia -Per family member she is currently at baseline which is nonverbal, contracted.  Adult failure to thrive -Family/guardian requesting residential hospice placement.  Pressure ulcer of the sacrum, stage IV -Have requested wound care for further recommendations. -Present prior to admission.  Discharge Diagnoses:  Active Problems:   Dementia   Decubitus ulcer of left foot   Decubitus ulcer of ischium, left, stage IV (HCC)   Dehydration   Chronic pain   DNR (do not resuscitate)   Comfort measures only status  Discharge Instructions:  Allergies as of 11/21/2017   No Known Allergies     Medication List    STOP taking these medications   ipratropium-albuterol 0.5-2.5 (3) MG/3ML Soln Commonly known as:  DUONEB   ondansetron 4 MG disintegrating tablet Commonly known as:  ZOFRAN-ODT       No Known Allergies Allergies as of 11/21/2017   No Known Allergies     Medication List    STOP taking these medications   ipratropium-albuterol 0.5-2.5 (3) MG/3ML Soln Commonly known as:  DUONEB   ondansetron 4 MG disintegrating tablet Commonly known as:  ZOFRAN-ODT       Procedures/Studies: US Renal  Result Date: 11/15/2017 CLINICAL DATA:  Acute renal failure. EXAM: RENAL / URINARY TRACT ULTRASOUND COMPLETE COMPARISON:  None. FINDINGS: Right Kidney: Length: 8.8 cm. Echogenicity within normal limits. No mass or hydronephrosis visualized. Left Kidney: Length: 10.0 cm. Echogenicity within normal limits. No mass or hydronephrosis visualized. Bladder: Appears normal for degree of bladder distention. IMPRESSION: Normal appearance of both kidneys.  No evidence of hydronephrosis. Electronically Signed   By: Earle Gell  M.D.   On: 11/15/2017 16:48   Dg Chest Port 1 View  Result Date:  11/14/2017 CLINICAL DATA:  Altered mental status. Worsening level of consciousness. EXAM: PORTABLE CHEST 1 VIEW COMPARISON:  11/08/2016.  Chest CT 05/04/2017 FINDINGS: Heart size is normal. Mediastinal shadows are normal. Right arm PICC tip in the SVC 2 cm above the right atrium. Patchy bilateral bronchopneumonia, most pronounced in the left lower lobe. No dense consolidation or lobar collapse. No effusions. Small segment of embolized wire in the left lower lobe pulmonary arterial tree is unchanged from previous studies as distant as 11/08/2016. IMPRESSION: Patchy bilateral bronchopneumonia, most pronounced in the left lower lobe. Electronically Signed   By: Nelson Chimes M.D.   On: 11/14/2017 13:46      Subjective: Patient appears comfortable in no distress  Discharge Exam: Vitals:   11/20/17 1900  BP: (!) 79/51  Pulse: 89  Resp: 18  Temp: 98.5 F (36.9 C)  SpO2: 97%   Vitals:   11/20/17 1900  BP: (!) 79/51  Pulse: 89  Resp: 18  Temp: 98.5 F (36.9 C)  TempSrc: Axillary  SpO2: 97%   General exam:Awake, unable to assess orientation given nonverbal state, appears chronically ill and cachectic, is contracted. NAD.  Respiratory system: Clear to auscultation. Respiratory effort normal. Cardiovascular system:normal s1, s2 sounds No murmurs, rubs, gallops. Gastrointestinal system:Abdomen is nondistended, soft and nontender. No organomegaly or masses felt. Normal bowel sounds heard. Central nervous system:contracted. Extremities: No C/C/E, +pedal pulses Skin: Sacral decubitus ulcer, likely stage IV Psychiatry:Unable to assess   The results of significant diagnostics from this hospitalization (including imaging, microbiology, ancillary and laboratory) are listed below for reference.     Microbiology: Recent Results (from the past 240 hour(s))  Urine culture     Status: None   Collection Time: 11/14/17  1:05 PM  Result Value Ref Range Status   Specimen Description URINE,  CATHETERIZED  Final   Special Requests NONE  Final   Culture   Final    NO GROWTH Performed at [facility], [address] [place], [place] [address]    Report Status 11/15/2017 FINAL  Final  Blood Culture (routine x 2)     Status: None   Collection Time: 11/14/17  1:35 PM  Result Value Ref Range Status   Specimen Description BLOOD LEFT ARM  Final   Special Requests   Final    BOTTLES DRAWN AEROBIC AND ANAEROBIC Blood Culture adequate volume   Culture NO GROWTH 5 DAYS  Final   Report Status 11/19/2017 FINAL  Final  Blood Culture (routine x 2)     Status: None   Collection Time: 11/14/17  1:35 PM  Result Value Ref Range Status   Specimen Description BLOOD LEFT HAND  Final   Special Requests   Final    BOTTLES DRAWN AEROBIC AND ANAEROBIC Blood Culture adequate volume   Culture NO GROWTH 5 DAYS  Final   Report Status 11/19/2017 FINAL  Final  MRSA PCR Screening     Status: Abnormal   Collection Time: 11/15/17 11:22 AM  Result Value Ref Range Status   MRSA by PCR POSITIVE (A) NEGATIVE Final    Comment:        The GeneXpert MRSA Assay (FDA approved for NASAL specimens only), is one component of a comprehensive MRSA colonization surveillance program. It is not intended to diagnose MRSA infection nor to guide or monitor treatment for MRSA infections. RESULT CALLED TO, READ BACK BY AND  VERIFIED WITH: JACKSON @ [id number] BY HENDERSON L      Labs: BNP (last 3 results) No results for input(s): BNP in the last 8760 hours. Basic Metabolic Panel: Recent Labs  Lab 11/14/17 1334 11/14/17 2000 11/15/17 0615 11/17/17 0450 11/18/17 0455  NA 169* 167* 165* 158* 145  K 3.2* 2.9* 3.2* 2.6* 2.8*  CL 129* 129* >130* 130* 120*  CO2 20* 20* 15* 16* 14*  GLUCOSE 154* 135* 102* 125* 118*  BUN 113* 115* 105* 71* 48*  CREATININE 3.94* 3.92* 3.31* 2.48* 1.89*  CALCIUM 8.0* 7.8* 7.5* 8.3* 7.6*  MG  --   --   --  2.1 1.7  PHOS  --   --   --  3.5  --    Liver Function  Tests: Recent Labs  Lab 11/14/17 1334  AST 30  ALT 43  ALKPHOS 77  BILITOT 0.4  PROT 7.3  ALBUMIN 2.6*   No results for input(s): LIPASE, AMYLASE in the last 168 hours. No results for input(s): AMMONIA in the last 168 hours. CBC: Recent Labs  Lab 11/14/17 1334 11/15/17 0615 11/17/17 0450 11/18/17 0455  WBC 16.1* 12.4* 11.2* 10.2  NEUTROABS 14.0* 11.0*  --   --   HGB 10.6* 8.6* 8.7* 8.1*  HCT 36.5 29.7* 29.4* 26.0*  MCV 87.7 87.1 87.0 83.9  PLT 275 195 174 185   Cardiac Enzymes: No results for input(s): CKTOTAL, CKMB, CKMBINDEX, TROPONINI in the last 168 hours. BNP: Invalid input(s): POCBNP CBG: No results for input(s): GLUCAP in the last 168 hours. D-Dimer No results for input(s): DDIMER in the last 72 hours. Hgb A1c No results for input(s): HGBA1C in the last 72 hours. Lipid Profile No results for input(s): CHOL, HDL, LDLCALC, TRIG, CHOLHDL, LDLDIRECT in the last 72 hours. Thyroid function studies No results for input(s): TSH, T4TOTAL, T3FREE, THYROIDAB in the last 72 hours.  Invalid input(s): FREET3 Anemia work up No results for input(s): VITAMINB12, FOLATE, FERRITIN, TIBC, IRON, RETICCTPCT in the last 72 hours. Urinalysis    Component Value Date/Time   COLORURINE AMBER (A) 11/14/2017 1305   APPEARANCEUR HAZY (A) 11/14/2017 1305   LABSPEC 1.020 11/14/2017 1305   PHURINE 5.0 11/14/2017 1305   GLUCOSEU NEGATIVE 11/14/2017 1305   HGBUR LARGE (A) 11/14/2017 1305   BILIRUBINUR NEGATIVE 11/14/2017 1305   KETONESUR NEGATIVE 11/14/2017 1305   PROTEINUR NEGATIVE 11/14/2017 1305   UROBILINOGEN 1.0 07/18/2012 1714   NITRITE NEGATIVE 11/14/2017 1305   LEUKOCYTESUR NEGATIVE 11/14/2017 1305   Sepsis Labs Invalid input(s): PROCALCITONIN,  WBC,  LACTICIDVEN Microbiology Recent Results (from the past 240 hour(s))  Urine culture     Status: None   Collection Time: 11/14/17  1:05 PM  Result Value Ref Range Status   Specimen Description URINE, CATHETERIZED  Final    Special Requests NONE  Final   Culture   Final    NO GROWTH Performed at [facility], [place] [address] [place], [place] [address]    Report Status 11/15/2017 FINAL  Final  Blood Culture (routine x 2)     Status: None   Collection Time: 11/14/17  1:35 PM  Result Value Ref Range Status   Specimen Description BLOOD LEFT ARM  Final   Special Requests   Final    BOTTLES DRAWN AEROBIC AND ANAEROBIC Blood Culture adequate volume   Culture NO GROWTH 5 DAYS  Final   Report Status 11/19/2017 FINAL  Final  Blood Culture (routine x 2)  Status: None   Collection Time: 11/14/17  1:35 PM  Result Value Ref Range Status   Specimen Description BLOOD LEFT HAND  Final   Special Requests   Final    BOTTLES DRAWN AEROBIC AND ANAEROBIC Blood Culture adequate volume   Culture NO GROWTH 5 DAYS  Final   Report Status 11/19/2017 FINAL  Final  MRSA PCR Screening     Status: Abnormal   Collection Time: 11/15/17 11:22 AM  Result Value Ref Range Status   MRSA by PCR POSITIVE (A) NEGATIVE Final    Comment:        The GeneXpert MRSA Assay (FDA approved for NASAL specimens only), is one component of a comprehensive MRSA colonization surveillance program. It is not intended to diagnose MRSA infection nor to guide or monitor treatment for MRSA infections. RESULT CALLED TO, READ BACK BY AND VERIFIED WITH: JACKSON @ [id number] BY HENDERSON L     Time coordinating discharge:   SIGNED:  Irwin Brakeman, MD  [facility] 11/21/2017, 10:30 AM Pager [phone]  If 7PM-7AM, please contact night-coverage [email or web address] Password [email or web address]

## 2017-11-21 NOTE — Discharge Instructions (Signed)
Symptom management per hospice protocol  

## 2017-11-21 NOTE — Progress Notes (Signed)
Paged MD to let him know patient has a bed at [facility].

## 2017-11-21 NOTE — Progress Notes (Signed)
Pt on comfort care. Resting quietly with eyes closed. Turned Q2h for comfort. Shows no signs of distress or discomfort at this time. Will continue to monitor.

## 2017-12-18 DEATH — deceased
